# Patient Record
Sex: Male | Born: 1945 | Race: White | Hispanic: No | Smoking: Never smoker
Health system: Southern US, Community
[De-identification: ages and names within clinical notes are randomized; demographics above are authoritative.]

## PROBLEM LIST (undated history)

## (undated) DIAGNOSIS — M7741 Metatarsalgia, right foot: Secondary | ICD-10-CM

## (undated) DIAGNOSIS — I2699 Other pulmonary embolism without acute cor pulmonale: Secondary | ICD-10-CM

## (undated) DIAGNOSIS — I10 Essential (primary) hypertension: Secondary | ICD-10-CM

## (undated) DIAGNOSIS — K869 Disease of pancreas, unspecified: Secondary | ICD-10-CM

## (undated) DIAGNOSIS — I2510 Atherosclerotic heart disease of native coronary artery without angina pectoris: Secondary | ICD-10-CM

## (undated) DIAGNOSIS — G459 Transient cerebral ischemic attack, unspecified: Secondary | ICD-10-CM

## (undated) DIAGNOSIS — M109 Gout, unspecified: Secondary | ICD-10-CM

## (undated) DIAGNOSIS — Z87442 Personal history of urinary calculi: Secondary | ICD-10-CM

## (undated) DIAGNOSIS — Z7901 Long term (current) use of anticoagulants: Secondary | ICD-10-CM

## (undated) DIAGNOSIS — F419 Anxiety disorder, unspecified: Secondary | ICD-10-CM

## (undated) DIAGNOSIS — Z86718 Personal history of other venous thrombosis and embolism: Secondary | ICD-10-CM

## (undated) DIAGNOSIS — G4730 Sleep apnea, unspecified: Secondary | ICD-10-CM

## (undated) DIAGNOSIS — K862 Cyst of pancreas: Secondary | ICD-10-CM

## (undated) DIAGNOSIS — E785 Hyperlipidemia, unspecified: Secondary | ICD-10-CM

## (undated) DIAGNOSIS — I714 Abdominal aortic aneurysm, without rupture: Secondary | ICD-10-CM

## (undated) DIAGNOSIS — I739 Peripheral vascular disease, unspecified: Secondary | ICD-10-CM

## (undated) HISTORY — DX: Essential (primary) hypertension: I10

## (undated) HISTORY — PX: AORTIC VALVE REPLACEMENT: SHX41

## (undated) HISTORY — DX: Peripheral vascular disease, unspecified: I73.9

## (undated) HISTORY — DX: Transient cerebral ischemic attack, unspecified: G45.9

## (undated) HISTORY — DX: Abdominal aortic aneurysm, without rupture: I71.4

## (undated) HISTORY — DX: Personal history of other venous thrombosis and embolism: Z86.718

## (undated) HISTORY — DX: Disease of pancreas, unspecified: K86.9

## (undated) HISTORY — DX: Hyperlipidemia, unspecified: E78.5

## (undated) HISTORY — DX: Metatarsalgia, right foot: M77.41

## (undated) HISTORY — DX: Atherosclerotic heart disease of native coronary artery without angina pectoris: I25.10

## (undated) HISTORY — DX: Anxiety disorder, unspecified: F41.9

## (undated) HISTORY — DX: Cyst of pancreas: K86.2

## (undated) HISTORY — DX: Other pulmonary embolism without acute cor pulmonale: I26.99

## (undated) HISTORY — PX: BACK SURGERY: SHX140

## (undated) HISTORY — DX: Gout, unspecified: M10.9

## (undated) HISTORY — DX: Long term (current) use of anticoagulants: Z79.01

---

## 2001-07-16 ENCOUNTER — Emergency Department (HOSPITAL_COMMUNITY): Admission: EM | Admit: 2001-07-16 | Discharge: 2001-07-16 | Payer: Self-pay | Admitting: Emergency Medicine

## 2003-08-07 ENCOUNTER — Encounter: Payer: Self-pay | Admitting: Unknown Physician Specialty

## 2003-08-07 ENCOUNTER — Ambulatory Visit (HOSPITAL_COMMUNITY): Admission: RE | Admit: 2003-08-07 | Discharge: 2003-08-07 | Payer: Self-pay | Admitting: Unknown Physician Specialty

## 2003-11-29 HISTORY — PX: CORONARY ARTERY BYPASS GRAFT: SHX141

## 2004-07-18 ENCOUNTER — Inpatient Hospital Stay (HOSPITAL_COMMUNITY): Admission: EM | Admit: 2004-07-18 | Discharge: 2004-08-05 | Payer: Self-pay | Admitting: Emergency Medicine

## 2004-07-19 ENCOUNTER — Encounter (INDEPENDENT_AMBULATORY_CARE_PROVIDER_SITE_OTHER): Payer: Self-pay | Admitting: Cardiovascular Disease

## 2004-07-29 ENCOUNTER — Encounter (INDEPENDENT_AMBULATORY_CARE_PROVIDER_SITE_OTHER): Payer: Self-pay | Admitting: *Deleted

## 2004-08-07 ENCOUNTER — Emergency Department (HOSPITAL_COMMUNITY): Admission: EM | Admit: 2004-08-07 | Discharge: 2004-08-07 | Payer: Self-pay | Admitting: Emergency Medicine

## 2004-08-16 ENCOUNTER — Encounter: Admission: RE | Admit: 2004-08-16 | Discharge: 2004-08-16 | Payer: Self-pay | Admitting: Surgery

## 2004-08-17 ENCOUNTER — Ambulatory Visit (HOSPITAL_COMMUNITY): Admission: RE | Admit: 2004-08-17 | Discharge: 2004-08-17 | Payer: Self-pay | Admitting: Cardiovascular Disease

## 2004-09-29 ENCOUNTER — Ambulatory Visit: Payer: Self-pay | Admitting: Family Medicine

## 2005-01-03 IMAGING — CR DG CHEST 2V
2 series · 2 of 2 positions shown · non-contrast
Comparison: none

CLINICAL DATA: Coronary artery disease.  Prior CABG ? follow/up. 
 CHEST X-RAY:
 Two views of the chest are compared to a portable chest x-ray from [REDACTED] dated 08/07/04.  Opacity at the left lung base persists consistent with atelectasis and small left effusion.  The right lung is clear.  No pneumothorax is seen.  Heart size is stable.

[view not recorded (1 of 2)]
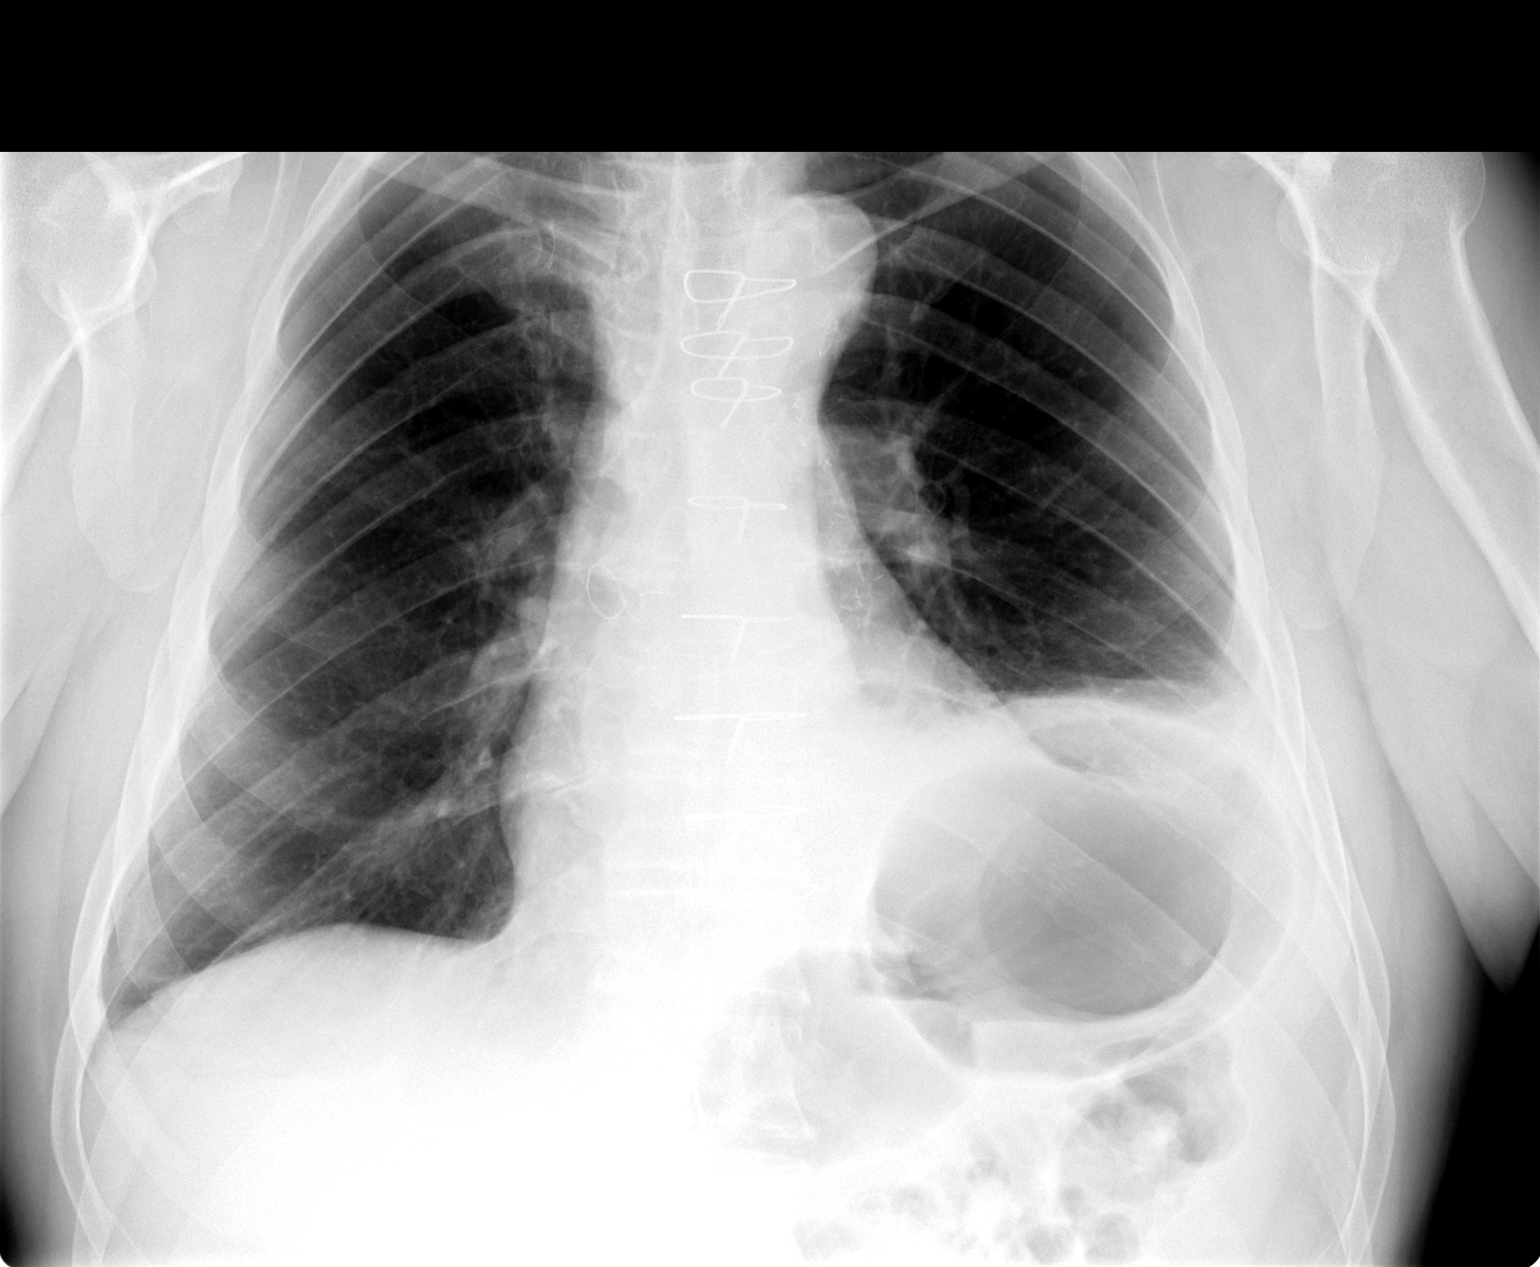

[view not recorded (2 of 2)]
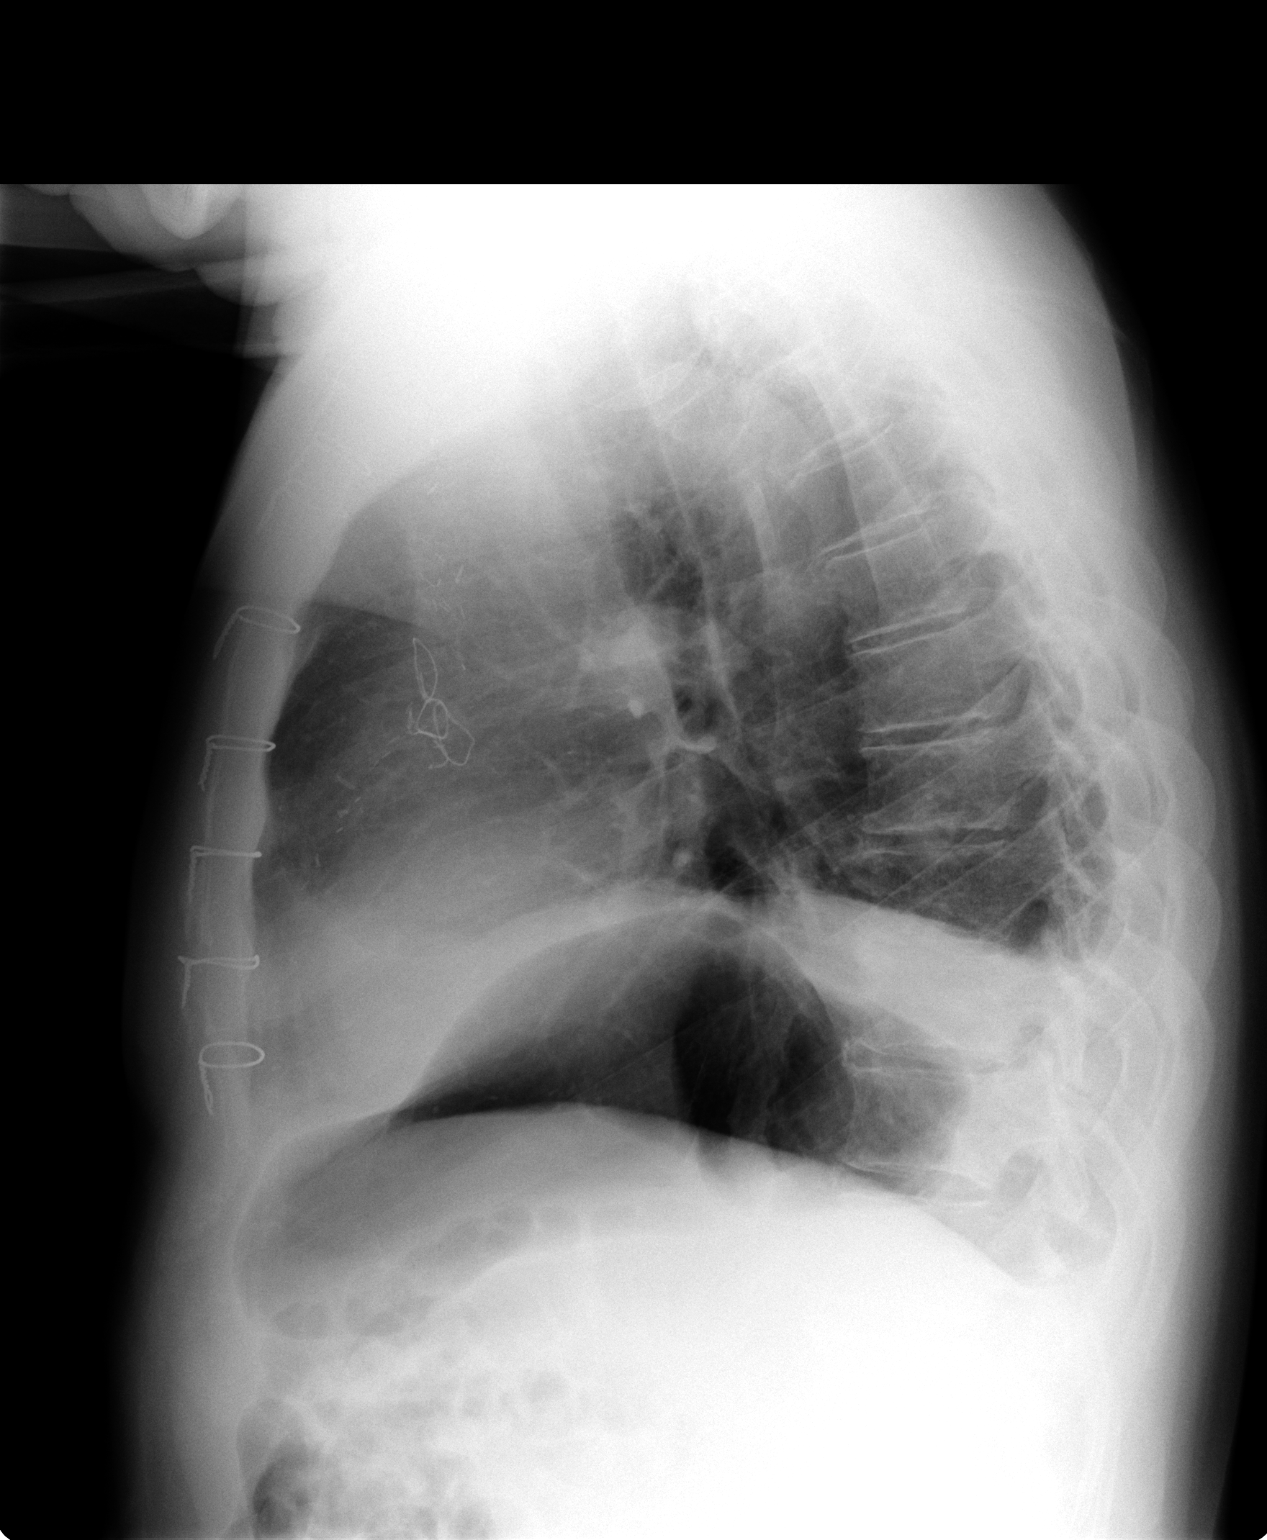

[2 of 2 positions shown; findings below may reference images not displayed]

IMPRESSION: Persistent opacity at the left lung base consistent with left effusion and left basilar atelectasis.

## 2005-01-04 IMAGING — CT CT HEAD W/O CM
1 series · 16 of 28 positions shown, 20 images · non-contrast
Comparison: 07/19/2004

CLINICAL DATA: Recent bypass. Now with blurred vision in left eye and dizziness.

Head CT without contrast.

[Series 2: brain · axial · 0.47mm/px · z∈[+120,+250]mm · 16 of 28 slices shown, 20 images]
[im 2/28  brain]
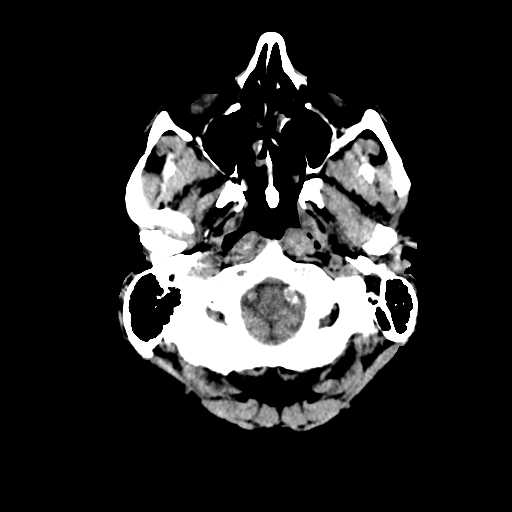
[im 2/28  bone]
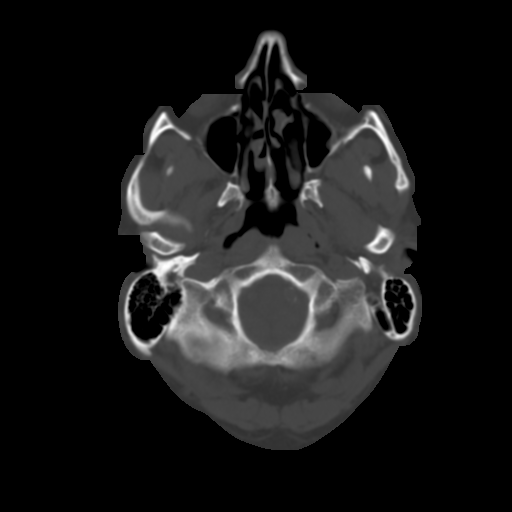
[im 4/28  brain]
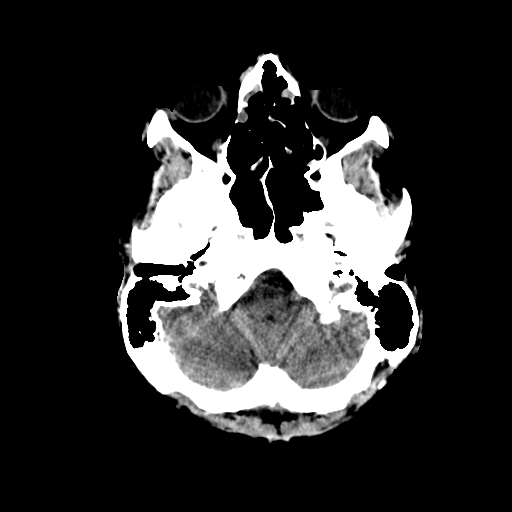
[im 6/28  brain]
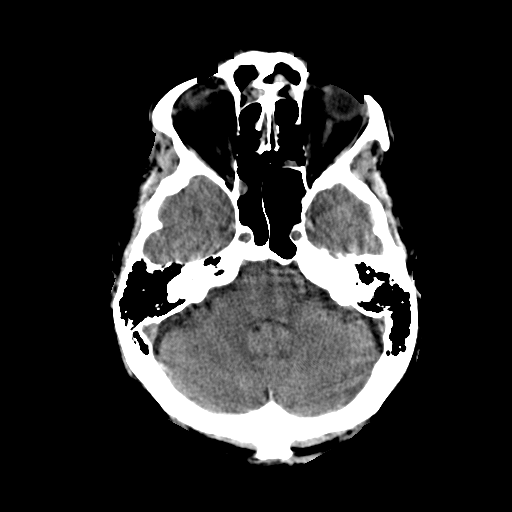
[im 7/28  brain]
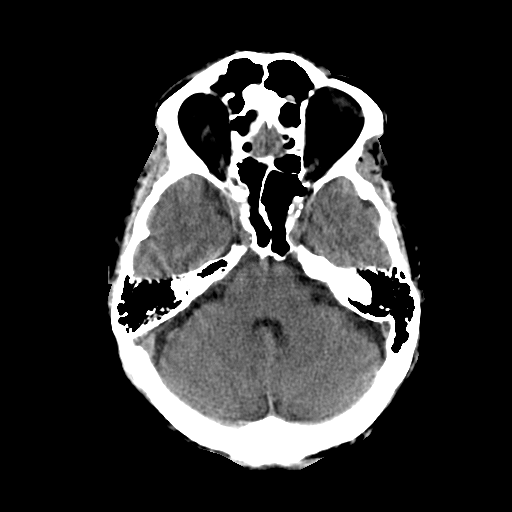
[im 9/28  brain]
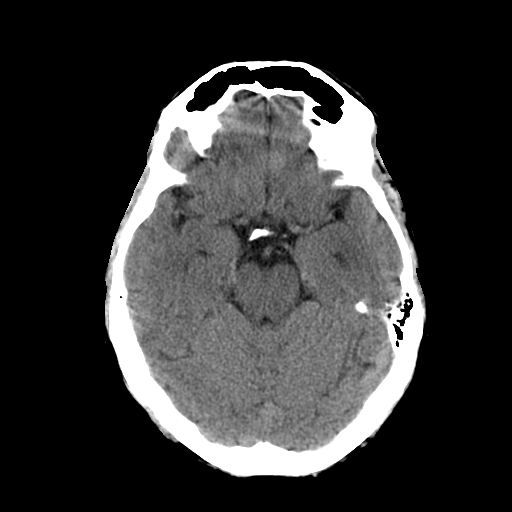
[im 9/28  bone]
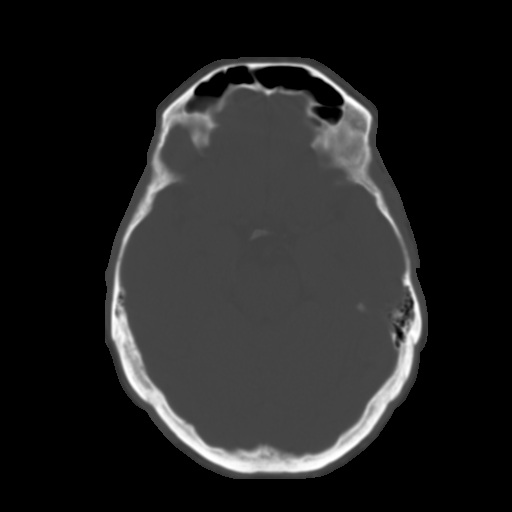
[im 10/28  brain]
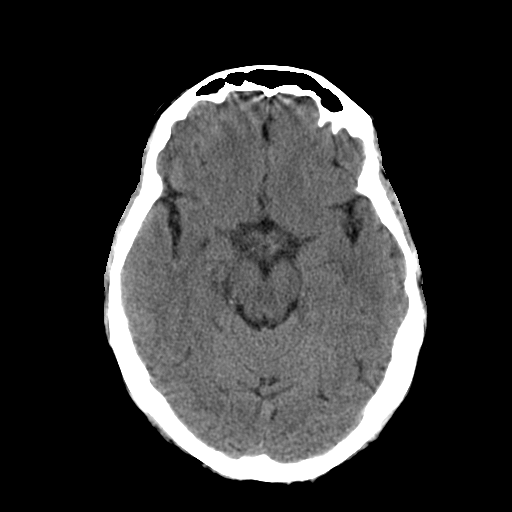
[im 12/28  brain]
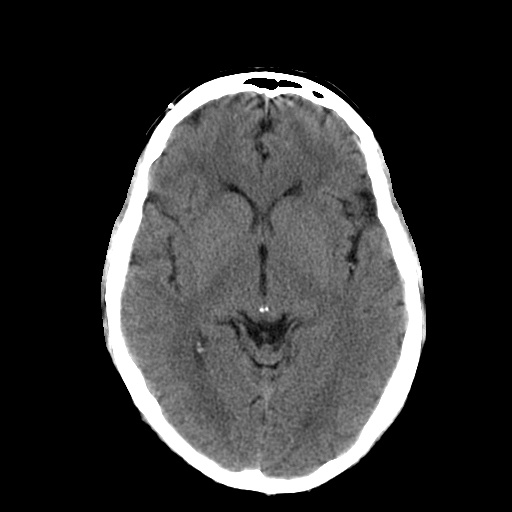
[im 14/28  brain]
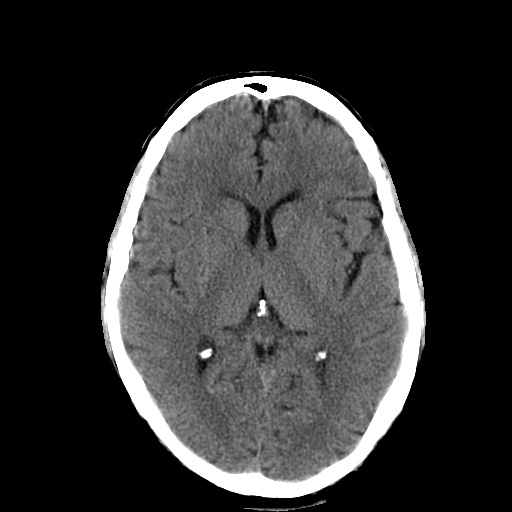
[im 15/28  brain]
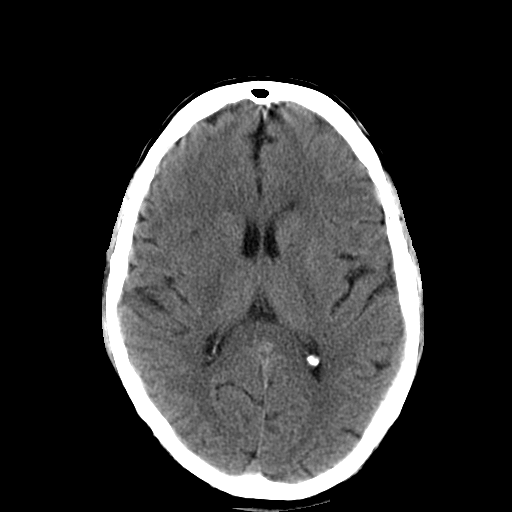
[im 15/28  bone]
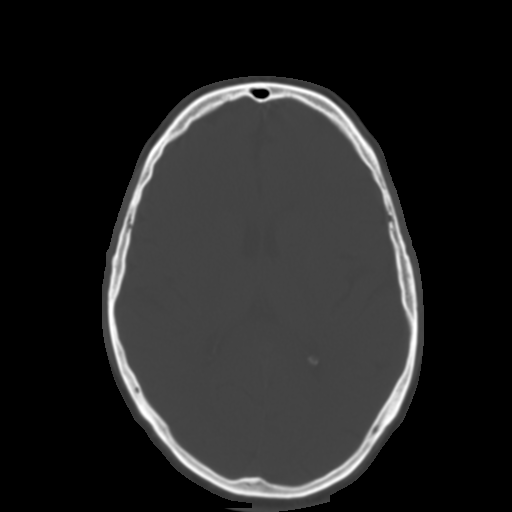
[im 17/28  brain]
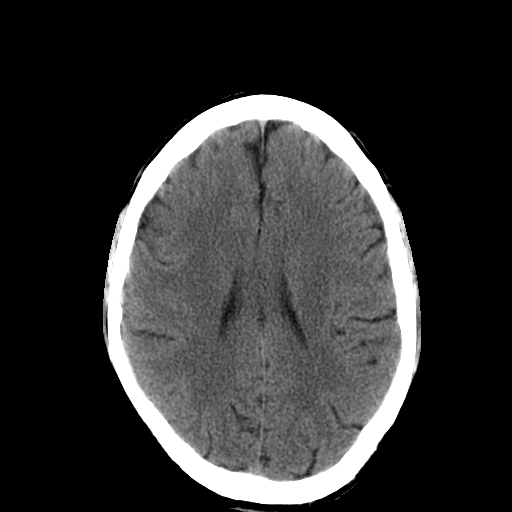
[im 19/28  brain]
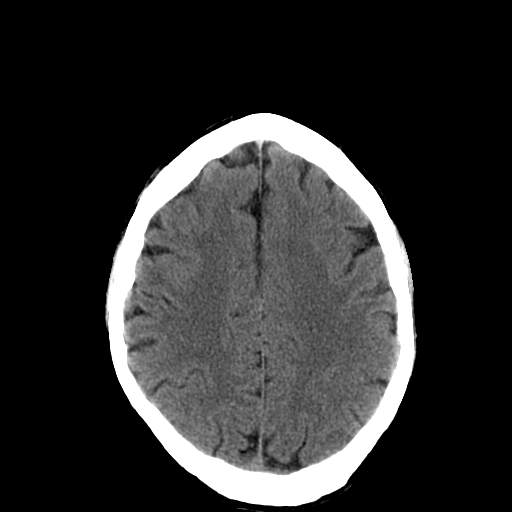
[im 20/28  brain]
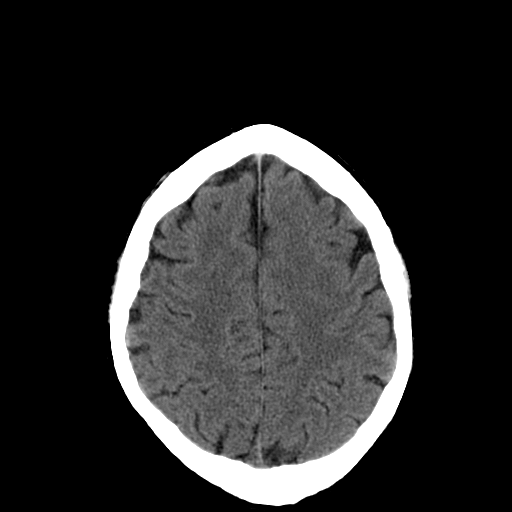
[im 22/28  brain]
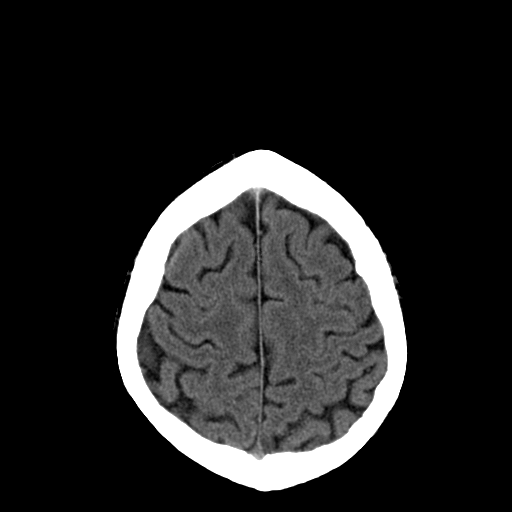
[im 22/28  bone]
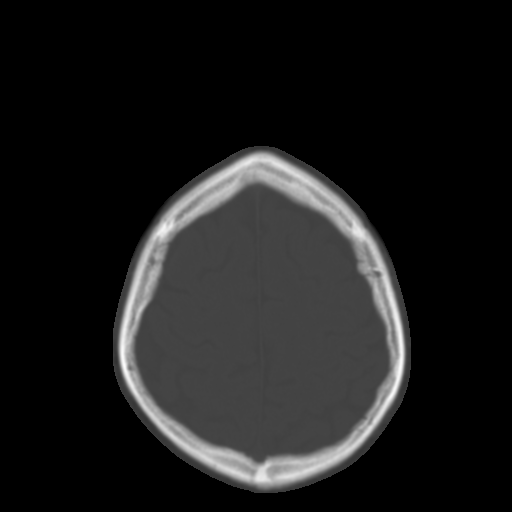
[im 23/28  brain]
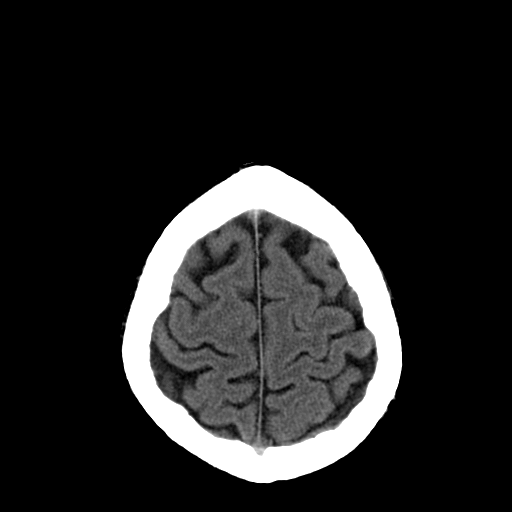
[im 25/28  brain]
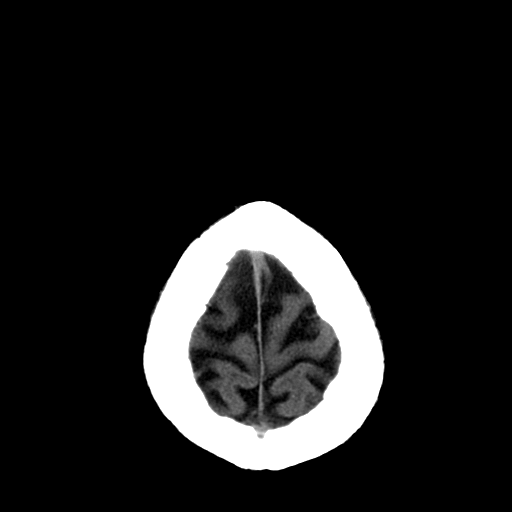
[im 27/28  brain]
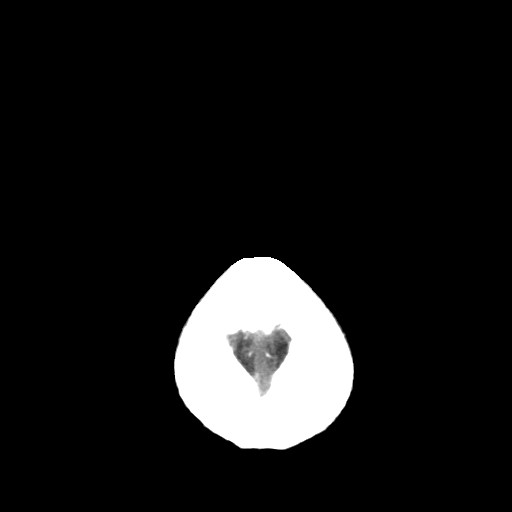

[16 of 28 positions shown; findings below may reference images not displayed]

FINDINGS: There is mild cerebral atrophy. No acute intracranial findings. Specifically no evidence
of hemorrhage, hydrocephalus, tumor, vascular lesion, or acute infarction.

There are mild changes of  chronic sinusitis noted. Visualize calvarium unremarkable.

IMPRESSION

No acute intracranial abnormality.

## 2005-03-22 ENCOUNTER — Ambulatory Visit: Payer: Self-pay | Admitting: Family Medicine

## 2005-10-05 ENCOUNTER — Ambulatory Visit: Payer: Self-pay | Admitting: Family Medicine

## 2006-06-13 ENCOUNTER — Ambulatory Visit: Payer: Self-pay | Admitting: Family Medicine

## 2006-11-03 ENCOUNTER — Ambulatory Visit: Payer: Self-pay | Admitting: Family Medicine

## 2007-05-07 ENCOUNTER — Ambulatory Visit: Payer: Self-pay | Admitting: Family Medicine

## 2009-03-22 ENCOUNTER — Emergency Department (HOSPITAL_COMMUNITY): Admission: EM | Admit: 2009-03-22 | Discharge: 2009-03-22 | Payer: Self-pay | Admitting: Emergency Medicine

## 2009-03-23 ENCOUNTER — Inpatient Hospital Stay (HOSPITAL_COMMUNITY): Admission: EM | Admit: 2009-03-23 | Discharge: 2009-03-29 | Payer: Self-pay | Admitting: Emergency Medicine

## 2009-03-27 ENCOUNTER — Encounter (INDEPENDENT_AMBULATORY_CARE_PROVIDER_SITE_OTHER): Payer: Self-pay | Admitting: Orthopedic Surgery

## 2009-03-27 ENCOUNTER — Ambulatory Visit: Payer: Self-pay | Admitting: Vascular Surgery

## 2009-04-06 ENCOUNTER — Inpatient Hospital Stay (HOSPITAL_COMMUNITY): Admission: EM | Admit: 2009-04-06 | Discharge: 2009-04-14 | Payer: Self-pay | Admitting: Emergency Medicine

## 2009-04-06 ENCOUNTER — Encounter (INDEPENDENT_AMBULATORY_CARE_PROVIDER_SITE_OTHER): Payer: Self-pay | Admitting: Internal Medicine

## 2009-04-21 ENCOUNTER — Emergency Department (HOSPITAL_COMMUNITY): Admission: EM | Admit: 2009-04-21 | Discharge: 2009-04-21 | Payer: Self-pay | Admitting: Emergency Medicine

## 2011-03-08 LAB — COMPREHENSIVE METABOLIC PANEL
ALT: 130 U/L — ABNORMAL HIGH (ref 0–53)
ALT: 151 U/L — ABNORMAL HIGH (ref 0–53)
ALT: 150 U/L — ABNORMAL HIGH (ref 0–53)
ALT: 58 U/L — ABNORMAL HIGH (ref 0–53)
AST: 130 U/L — ABNORMAL HIGH (ref 0–37)
Albumin: 2.3 g/dL — ABNORMAL LOW (ref 3.5–5.2)
Albumin: 2.5 g/dL — ABNORMAL LOW (ref 3.5–5.2)
Alkaline Phosphatase: 193 U/L — ABNORMAL HIGH (ref 39–117)
Alkaline Phosphatase: 75 U/L (ref 39–117)
Alkaline Phosphatase: 97 U/L (ref 39–117)
BUN: 15 mg/dL (ref 6–23)
BUN: 18 mg/dL (ref 6–23)
BUN: 25 mg/dL — ABNORMAL HIGH (ref 6–23)
CO2: 32 mEq/L (ref 19–32)
CO2: 27 mEq/L (ref 19–32)
CO2: 33 mEq/L — ABNORMAL HIGH (ref 19–32)
Calcium: 9.2 mg/dL (ref 8.4–10.5)
Calcium: 9.3 mg/dL (ref 8.4–10.5)
Calcium: 8.7 mg/dL (ref 8.4–10.5)
Chloride: 108 mEq/L (ref 96–112)
Chloride: 100 mEq/L (ref 96–112)
Chloride: 101 mEq/L (ref 96–112)
Chloride: 103 mEq/L (ref 96–112)
Creatinine, Ser: 1.16 mg/dL (ref 0.4–1.5)
Creatinine, Ser: 1.19 mg/dL (ref 0.4–1.5)
Creatinine, Ser: 1.13 mg/dL (ref 0.4–1.5)
Creatinine, Ser: 1.23 mg/dL (ref 0.4–1.5)
GFR calc non Af Amer: >60 mL/min (ref >60)
GFR calc non Af Amer: 59 mL/min — ABNORMAL LOW (ref >60)
GFR calc non Af Amer: >60 mL/min (ref >60)
Glucose, Bld: 112 mg/dL — ABNORMAL HIGH (ref 70–99)
Glucose, Bld: 122 mg/dL — ABNORMAL HIGH (ref 70–99)
Glucose, Bld: 127 mg/dL — ABNORMAL HIGH (ref 70–99)
Glucose, Bld: 99 mg/dL (ref 70–99)
Potassium: 4.9 mEq/L (ref 3.5–5.1)
Potassium: 3.5 mEq/L (ref 3.5–5.1)
Potassium: 3.8 mEq/L (ref 3.5–5.1)
Sodium: 143 mEq/L (ref 135–145)
Sodium: 144 mEq/L (ref 135–145)
Sodium: 137 mEq/L (ref 135–145)
Total Bilirubin: 0.8 mg/dL (ref 0.3–1.2)
Total Bilirubin: 0.8 mg/dL (ref 0.3–1.2)
Total Bilirubin: 0.8 mg/dL (ref 0.3–1.2)
Total Bilirubin: 1.3 mg/dL — ABNORMAL HIGH (ref 0.3–1.2)
Total Protein: 6.2 g/dL (ref 6.0–8.3)
Total Protein: 6.2 g/dL (ref 6.0–8.3)
Total Protein: 5.9 g/dL — ABNORMAL LOW (ref 6.0–8.3)

## 2011-03-08 LAB — BASIC METABOLIC PANEL
BUN: 22 mg/dL (ref 6–23)
BUN: 19 mg/dL (ref 6–23)
BUN: 23 mg/dL (ref 6–23)
CO2: 28 mEq/L (ref 19–32)
CO2: 29 mEq/L (ref 19–32)
CO2: 30 mEq/L (ref 19–32)
Calcium: 8.5 mg/dL (ref 8.4–10.5)
Chloride: 102 mEq/L (ref 96–112)
Chloride: 102 mEq/L (ref 96–112)
Chloride: 105 mEq/L (ref 96–112)
Creatinine, Ser: 1.15 mg/dL (ref 0.4–1.5)
Creatinine, Ser: 1.13 mg/dL (ref 0.4–1.5)
GFR calc Af Amer: >60 mL/min (ref >60)
GFR calc Af Amer: >60 mL/min (ref >60)
GFR calc non Af Amer: >60 mL/min (ref >60)
Glucose, Bld: 101 mg/dL — ABNORMAL HIGH (ref 70–99)
Glucose, Bld: 103 mg/dL — ABNORMAL HIGH (ref 70–99)
Glucose, Bld: 106 mg/dL — ABNORMAL HIGH (ref 70–99)
Glucose, Bld: 123 mg/dL — ABNORMAL HIGH (ref 70–99)
Potassium: 3.2 mEq/L — ABNORMAL LOW (ref 3.5–5.1)
Potassium: 4.4 mEq/L (ref 3.5–5.1)
Sodium: 138 mEq/L (ref 135–145)
Sodium: 140 mEq/L (ref 135–145)

## 2011-03-08 LAB — CBC
HCT: 33.9 % — ABNORMAL LOW (ref 39.0–52.0)
HCT: 34.3 % — ABNORMAL LOW (ref 39.0–52.0)
HCT: 31.4 % — ABNORMAL LOW (ref 39.0–52.0)
HCT: 32.2 % — ABNORMAL LOW (ref 39.0–52.0)
HCT: 33.6 % — ABNORMAL LOW (ref 39.0–52.0)
HCT: 35.3 % — ABNORMAL LOW (ref 39.0–52.0)
Hemoglobin: 11.6 g/dL — ABNORMAL LOW (ref 13.0–17.0)
Hemoglobin: 10.9 g/dL — ABNORMAL LOW (ref 13.0–17.0)
Hemoglobin: 11.2 g/dL — ABNORMAL LOW (ref 13.0–17.0)
Hemoglobin: 11.6 g/dL — ABNORMAL LOW (ref 13.0–17.0)
Hemoglobin: 11.7 g/dL — ABNORMAL LOW (ref 13.0–17.0)
MCHC: 33.8 g/dL (ref 30.0–36.0)
MCHC: 34 g/dL (ref 30.0–36.0)
MCHC: 33.7 g/dL (ref 30.0–36.0)
MCHC: 33.8 g/dL (ref 30.0–36.0)
MCHC: 34.6 g/dL (ref 30.0–36.0)
MCHC: 34.7 g/dL (ref 30.0–36.0)
MCHC: 34.8 g/dL (ref 30.0–36.0)
MCHC: 35.2 g/dL (ref 30.0–36.0)
MCV: 97 fL (ref 78.0–100.0)
MCV: 98.3 fL (ref 78.0–100.0)
MCV: 98.6 fL (ref 78.0–100.0)
MCV: 98.6 fL (ref 78.0–100.0)
MCV: 98.7 fL (ref 78.0–100.0)
MCV: 98.8 fL (ref 78.0–100.0)
MCV: 99.4 fL (ref 78.0–100.0)
Platelets: 372 10*3/uL (ref 150–400)
Platelets: 125 10*3/uL — ABNORMAL LOW (ref 150–400)
Platelets: 243 10*3/uL (ref 150–400)
Platelets: 305 10*3/uL (ref 150–400)
RBC: 3.45 MIL/uL — ABNORMAL LOW (ref 4.22–5.81)
RBC: 3.34 MIL/uL — ABNORMAL LOW (ref 4.22–5.81)
RBC: 3.38 MIL/uL — ABNORMAL LOW (ref 4.22–5.81)
RBC: 3.77 MIL/uL — ABNORMAL LOW (ref 4.22–5.81)
RDW: 14.1 % (ref 11.5–15.5)
RDW: 14.1 % (ref 11.5–15.5)
RDW: 13.3 % (ref 11.5–15.5)
RDW: 13.4 % (ref 11.5–15.5)
RDW: 13.6 % (ref 11.5–15.5)
RDW: 13.9 % (ref 11.5–15.5)
RDW: 14.1 % (ref 11.5–15.5)
WBC: 12.2 10*3/uL — ABNORMAL HIGH (ref 4.0–10.5)
WBC: 12.6 10*3/uL — ABNORMAL HIGH (ref 4.0–10.5)
WBC: 17.5 10*3/uL — ABNORMAL HIGH (ref 4.0–10.5)
WBC: 8.9 10*3/uL (ref 4.0–10.5)

## 2011-03-08 LAB — HEMOGLOBIN A1C: Hgb A1c MFr Bld: 5.4 % (ref 4.6–6.1)

## 2011-03-08 LAB — PROTIME-INR
INR: 2.3 — ABNORMAL HIGH (ref 0.00–1.49)
INR: 2.3 — ABNORMAL HIGH (ref 0.00–1.49)
Prothrombin Time: 15.5 seconds — ABNORMAL HIGH (ref 11.6–15.2)
Prothrombin Time: 26.6 seconds — ABNORMAL HIGH (ref 11.6–15.2)
Prothrombin Time: 32.2 seconds — ABNORMAL HIGH (ref 11.6–15.2)
Prothrombin Time: 32.6 seconds — ABNORMAL HIGH (ref 11.6–15.2)

## 2011-03-08 LAB — URINALYSIS, ROUTINE W REFLEX MICROSCOPIC
Ketones, ur: NEGATIVE mg/dL
Nitrite: NEGATIVE
pH: 5 (ref 5.0–8.0)

## 2011-03-08 LAB — DIFFERENTIAL
Basophils Absolute: 0 10*3/uL (ref 0.0–0.1)
Basophils Absolute: 0.1 10*3/uL (ref 0.0–0.1)
Basophils Relative: 0 % (ref 0–1)
Basophils Relative: 0 % (ref 0–1)
Eosinophils Absolute: 0.2 10*3/uL (ref 0.0–0.7)
Eosinophils Relative: 1 % (ref 0–5)
Lymphocytes Relative: 6 % — ABNORMAL LOW (ref 12–46)
Lymphs Abs: 1.4 10*3/uL (ref 0.7–4.0)
Monocytes Absolute: 1.2 10*3/uL — ABNORMAL HIGH (ref 0.1–1.0)
Neutro Abs: 14.9 10*3/uL — ABNORMAL HIGH (ref 1.7–7.7)
Neutrophils Relative %: 86 % — ABNORMAL HIGH (ref 43–77)

## 2011-03-08 LAB — URINE CULTURE: Colony Count: 4000

## 2011-03-08 LAB — URINE MICROSCOPIC-ADD ON

## 2011-03-09 LAB — COMPREHENSIVE METABOLIC PANEL
ALT: 26 U/L (ref 0–53)
AST: 35 U/L (ref 0–37)
Alkaline Phosphatase: 74 U/L (ref 39–117)
CO2: 29 mEq/L (ref 19–32)
Calcium: 9.1 mg/dL (ref 8.4–10.5)
GFR calc Af Amer: >60 mL/min (ref >60)
GFR calc non Af Amer: >60 mL/min (ref >60)
Glucose, Bld: 115 mg/dL — ABNORMAL HIGH (ref 70–99)
Potassium: 3.6 mEq/L (ref 3.5–5.1)
Sodium: 143 mEq/L (ref 135–145)

## 2011-03-09 LAB — BASIC METABOLIC PANEL
BUN: 16 mg/dL (ref 6–23)
BUN: 19 mg/dL (ref 6–23)
BUN: 25 mg/dL — ABNORMAL HIGH (ref 6–23)
BUN: 26 mg/dL — ABNORMAL HIGH (ref 6–23)
CO2: 24 mEq/L (ref 19–32)
CO2: 29 mEq/L (ref 19–32)
CO2: 30 mEq/L (ref 19–32)
Calcium: 9.4 mg/dL (ref 8.4–10.5)
Chloride: 102 mEq/L (ref 96–112)
Chloride: 103 mEq/L (ref 96–112)
Chloride: 108 mEq/L (ref 96–112)
Creatinine, Ser: 1.14 mg/dL (ref 0.4–1.5)
Creatinine, Ser: 1.16 mg/dL (ref 0.4–1.5)
Creatinine, Ser: 1.24 mg/dL (ref 0.4–1.5)
Creatinine, Ser: 1.3 mg/dL (ref 0.4–1.5)
GFR calc Af Amer: >60 mL/min (ref >60)
GFR calc Af Amer: >60 mL/min (ref >60)
GFR calc non Af Amer: >60 mL/min (ref >60)
GFR calc non Af Amer: >60 mL/min (ref >60)
Glucose, Bld: 100 mg/dL — ABNORMAL HIGH (ref 70–99)
Glucose, Bld: 122 mg/dL — ABNORMAL HIGH (ref 70–99)
Glucose, Bld: 99 mg/dL (ref 70–99)
Potassium: 3.6 mEq/L (ref 3.5–5.1)
Potassium: 4.3 mEq/L (ref 3.5–5.1)
Potassium: 4.3 mEq/L (ref 3.5–5.1)

## 2011-03-09 LAB — DIFFERENTIAL
Basophils Absolute: 0.1 10*3/uL (ref 0.0–0.1)
Basophils Relative: 1 % (ref 0–1)
Eosinophils Absolute: 0.2 10*3/uL (ref 0.0–0.7)
Neutro Abs: 5.5 10*3/uL (ref 1.7–7.7)
Neutrophils Relative %: 64 % (ref 43–77)

## 2011-03-09 LAB — CBC
HCT: 37.5 % — ABNORMAL LOW (ref 39.0–52.0)
HCT: 39.2 % (ref 39.0–52.0)
HCT: 41.4 % (ref 39.0–52.0)
Hemoglobin: 14.2 g/dL (ref 13.0–17.0)
MCHC: 34.2 g/dL (ref 30.0–36.0)
MCHC: 34.4 g/dL (ref 30.0–36.0)
MCHC: 34.5 g/dL (ref 30.0–36.0)
MCHC: 34.8 g/dL (ref 30.0–36.0)
MCV: 100.1 fL — ABNORMAL HIGH (ref 78.0–100.0)
MCV: 100.3 fL — ABNORMAL HIGH (ref 78.0–100.0)
MCV: 99.9 fL (ref 78.0–100.0)
Platelets: 135 10*3/uL — ABNORMAL LOW (ref 150–400)
Platelets: 141 10*3/uL — ABNORMAL LOW (ref 150–400)
Platelets: 142 10*3/uL — ABNORMAL LOW (ref 150–400)
Platelets: 142 10*3/uL — ABNORMAL LOW (ref 150–400)
RBC: 4.05 MIL/uL — ABNORMAL LOW (ref 4.22–5.81)
RBC: 4.1 MIL/uL — ABNORMAL LOW (ref 4.22–5.81)
RBC: 4.1 MIL/uL — ABNORMAL LOW (ref 4.22–5.81)
RBC: 4.13 MIL/uL — ABNORMAL LOW (ref 4.22–5.81)
RDW: 12.2 % (ref 11.5–15.5)
RDW: 12.9 % (ref 11.5–15.5)
RDW: 12.9 % (ref 11.5–15.5)
WBC: 11.5 10*3/uL — ABNORMAL HIGH (ref 4.0–10.5)
WBC: 6 10*3/uL (ref 4.0–10.5)
WBC: 8.4 10*3/uL (ref 4.0–10.5)

## 2011-03-09 LAB — URINALYSIS, ROUTINE W REFLEX MICROSCOPIC
Glucose, UA: NEGATIVE mg/dL
Hgb urine dipstick: NEGATIVE
Hgb urine dipstick: NEGATIVE
Ketones, ur: NEGATIVE mg/dL
Ketones, ur: NEGATIVE mg/dL
Protein, ur: NEGATIVE mg/dL
Urobilinogen, UA: 0.2 mg/dL (ref 0.0–1.0)
pH: 5.5 (ref 5.0–8.0)

## 2011-03-09 LAB — PROTIME-INR: Prothrombin Time: 14.3 seconds (ref 11.6–15.2)

## 2011-03-09 LAB — HEMOGLOBIN A1C: Hgb A1c MFr Bld: 5.5 % (ref 4.6–6.1)

## 2011-03-21 ENCOUNTER — Ambulatory Visit (HOSPITAL_COMMUNITY)
Admission: RE | Admit: 2011-03-21 | Discharge: 2011-03-21 | Disposition: A | Discharge status: Home / Self Care | Payer: Medicare Other | Source: Ambulatory Visit | Attending: Family Medicine | Admitting: Family Medicine

## 2011-03-21 ENCOUNTER — Other Ambulatory Visit (HOSPITAL_COMMUNITY): Payer: Self-pay | Admitting: Family Medicine

## 2011-03-21 DIAGNOSIS — I82409 Acute embolism and thrombosis of unspecified deep veins of unspecified lower extremity: Secondary | ICD-10-CM

## 2011-03-21 DIAGNOSIS — M7989 Other specified soft tissue disorders: Secondary | ICD-10-CM | POA: Insufficient documentation

## 2011-04-12 NOTE — Discharge Summary (Signed)
Kenneth Scott, PLACK NO.:  [id number]   MEDICAL RECORD NO.:  [id number]          PATIENT TYPE:  INP   LOCATION:  1507                         FACILITY:  [facility]   PHYSICIAN:  Hillery Aldo, M.D.   DATE OF BIRTH:  1945-12-05   DATE OF ADMISSION:  04/05/2009  DATE OF DISCHARGE:  04/14/2009                               DISCHARGE SUMMARY   PRIMARY CARE PHYSICIAN:  Dr. Lysbeth Galas.   CARDIOLOGIST:  Dr. Tresa Endo.   DISCHARGE DIAGNOSES:  1. Right lower extremity deep venous thrombosis.  2. Large right lower lobe pulmonary embolism.  3. Right lower lobe pneumonia.  4. Right lower lobe pulmonary infarction.  5. Right lower lobe pulmonary hemorrhage.  6. A 3.4-cm infrarenal abdominal aortic aneurysm.  7. Bilateral iliac artery aneurysms measuring 2.2 cm in the left and      distal right common iliac arteries.  8. History of coronary artery disease.  9. Hypertension.  10.Acute renal failure, resolved.  11.Gout.  12.Depression.  13.Dyslipidemia.  14.Constipation.  15.Hypokalemia.  16.Spinal stenosis, status post recent spinal surgery with laminectomy      and central decompression.  17.Prostatomegaly.   DISCHARGE MEDICATIONS:  1. Avalide 150/12.5 mg p.o. daily.  2. Lexapro 10 mg p.o. daily.  3. Metoprolol 25 mg p.o. b.i.d.  4. Vicodin 10/325 one tablet every 6 hours p.r.n. pain.  5. Robaxin 500 mg p.o. every 6 hours p.r.n. muscle spasm.  6. Coumadin 2.5 mg p.o. daily or as directed by Dr. Lysbeth Galas.  7. Aspirin 81 mg p.o. daily.   Note:  Crestor was discontinued due to elevated LFTs, colchicine was  discontinued secondary to acute renal failure.  The patient was  instructed not to restart Plavix which had been discontinued prior to  his surgery at the direction of Dr. Tresa Endo who saw the patient in  consultation while in the hospital to determine which antiplatelet  medications should be given since the patient would be on therapeutic  anticoagulation.   CONSULTATIONS:  Dr. Tresa Endo of Cardiology.   BRIEF ADMISSION HPI:  Patient is a 65 year old male with past medical  history of coronary artery disease who underwent a central  decompression, laminectomy, foraminotomy, microdiskectomy, and other  procedures for spinal stenosis by Dr. Jene Every.  He subsequently  was discharged and re-presented to the hospital on the date of admission  with a chief complaint of right-sided flank pain and some dyspnea.  Upon  initial evaluation in the emergency department, the patient was noted to  have an abnormal chest x-ray and subsequently underwent venous Doppler  studies which did show evidence of a right deep venous thrombosis and  therefore the patient was admitted for further evaluation and workup.  For the full details, please see the dictated report done by Dr. Center Callas.   PROCEDURES AND DIAGNOSTIC STUDIES.:  1. Chest x-ray on Apr 05, 2009, showed low-volume chest with bibasilar      atelectasis, postoperative changes of CABG, torturous thoracic      aorta accentuated by low lung volumes.  2. Venous Doppler studies of the lower extremities on Apr 06, 2009,  showed a deep venous thrombosis involving the right lower extremity      with no evidence of deep vein thrombosis involving the left lower      extremity.  No Baker cysts.  The right posterior tibial and right      peroneal veins were partially thrombosed.  3. CT scan of the chest, abdomen, and pelvis on Apr 06, 2009.  Chest:      Pulmonary embolus, especially in the right lower lobe pulmonary      artery in its branches with some extension into the right middle      lobe.  Clot burden is moderate and there is extensive air space      opacity in the right lower lobe which may be due to pulmonary      hemorrhage or infection.  Small right pleural effusion.      Atelectasis and volume loss in portions of the right upper lobe and      left lower lobe.  Abdomen:  Marked dependent atelectasis  in the      lungs.  Diffuse air space disease in the posterior basal segment of      the right lower lobe.  This probably represents infection or      aspiration.  Mass in the right lower lobe bronchovascular bundle.      Hepatic and renal cyst.  Small hiatal hernia.  A 3.4-cm infrarenal      abdominal aortic aneurysm.  Pelvis:  Bilateral iliac artery      aneurysms measuring 2.2 cm in the proximal left and distal right      common iliac arteries.  Postoperative changes of L4-L5.  Presumed      iatrogenic air in the urinary bladder.  4. Abdominal ultrasound on Apr 11, 2009, showed cholelithiasis and      sludge within the gallbladder without evidence of acute      cholecystitis or biliary dilatation.  Bilateral renal cysts and      left kidney is mildly complicated by diffuse low level internal      echoes.  This lesion showed no enhancement on preceding CT of Apr 06, 2009.  Small distal abdominal aortic aneurysm 3.3 cm greatest      in size.  Small right pleural effusion.   DISCHARGE LABORATORY VALUES:  PT/INR was 26.6, 2.3.  Sodium was 144,  potassium 4.9, chloride 108, bicarb 32, BUN 18, creatinine 1.19, glucose  112, total bilirubin 0.7, alkaline phosphatase 193, AST 88, ALT 130,  total protein 6.2, albumin 2.3, calcium 9.3.  White blood cell count was  12.3, hemoglobin 11.5, hematocrit 33.9, platelets 510.   HOSPITAL COURSE BY PROBLEM:  1. Acute right lower extremity DVT/right lower lobe pulmonary      embolism:  Patient was admitted and a diagnostic evaluation did      reveal postoperative DVT and embolization to the right lower lobe      of the lung.  Upon confirmation of this diagnosis, the patient was      initiated on therapeutic dose Lovenox and Coumadin.  Because of      concurrent antibiotic therapy, there was some difficulty with      supratherapeutic INRs.  Patient has now completed his course of      antibiotic therapy and his INR has come down.  His Coumadin was       held for several days prior to discharge and is being resumed today  at 2.5 mg.  It is likely that he will need a higher dose of      Coumadin in the outpatient setting and therefore we have arranged      close follow up with Dr. Lysbeth Galas and a repeat of his PT/INR on Apr 15, 2009, at Dr. Joyce Copa office.  His INR should be measured daily      with adjustment of his Coumadin dose as needed until it stabilizes.  2. Left lower lobe pneumonia with pulmonary infarction and hemorrhage:      Patient initially presented with signs of infection.  He had a      leukocytosis of 17.5 and given that he appeared septic with a      fever, he was treated for hospital-acquired pathogens.  He was      empirically put on vancomycin and Zosyn and completed a full 7-day      course of therapy with antibiotics which was subsequently      transitioned over to Levaquin on Apr 10, 2009.  Patient's white      blood cell count has stabilized and he has been afebrile and feels      well.  No further antibiotics are indicated at this time.  3. Peripheral vascular disease/peripheral arterial disease/abdominal      aortic aneurysm:  Patient should be monitored in the outpatient      environment for progression of disease.  His blood pressure should      remain well controlled.  His blood pressure has remained fairly      well controlled throughout his hospital stay with no complications.  4. Hypertension:  Patient's blood pressure was well controlled as      noted above.  5. Acute renal failure:  Patient did develop a bump in his creatinine      which may have been contrast induced.  His creatinine reached a      high of 1.30.  The patient's Avalide was held and his Coumadin      trended back down to baseline.  He can safely resume Avalide at      discharge.  6. Gout:  Patient indicates that his gout symptoms did not start until      he was put on niacin for treatment of his dyslipidemia.  Niacin      does  cause hyperuricemia and is not recommended in patients with      gout.  Patient was encouraged to discontinue niacin and speak with      his cardiologist regarding alternatives to therapy.  7. Constipation:  Patient was put on a bowel regimen and he was able      to move his bowels without difficulty.  8. Hypokalemia:  Patient was fully repleted and put on routine      supplements.  Supplements have been discontinued and he should have      a regular check of his electrolytes through his primary care      physician.  9. Dyslipidemia:  Patient was initially maintained on his usual dose      of statin therapy.  He did develop elevated liver function studies      that were new from admission when his LFTs were relatively normal.      Because of this, a right upper quadrant ultrasound was obtained to      rule out cholecystitis.  Patient had no symptoms of abdominal  discomfort, nausea, or vomiting.  His statin was held and has not      been resumed due to the elevation of his liver function studies.      It could be that he developed shock liver from his sepsis syndrome.      At this time, we have instructed him to discontinue Crestor until      he follows up with Dr. Lysbeth Galas.  He should have a repeat of his      liver function studies and if stable this can safely be resumed.  10.Depression:  Patient was maintained on his usual dose of Lexapro.  11.Prostatomegaly:  Patient had no urinary obstruction while in the      hospital.  We recommend routine screening for prostate cancer by      his primary care physician if deemed necessary.   DISPOSITION:  Patient is medically stable and will be discharged home.  He is ambulating in the hallway with a walker.  He has followup  scheduled for tomorrow with a repeat PT/INR and recommendations to have  this drawn daily until his INR is stable.  He has followup with Dr.  Lysbeth Galas scheduled on Apr 21, 2009 at 10 a.m.   TIME SPENT COORDINATING CARE  FOR DISCHARGE AND DISCHARGE INSTRUCTIONS:  Equals 45 minutes.      Hillery Aldo, M.D.  Electronically Signed     CR/MEDQ  D:  04/14/2009  T:  04/14/2009  Job:  [id number]   cc:   Delaney Meigs, M.D.  Fax: [phone]   Nicki Guadalajara, M.D.  Fax: [phone]

## 2011-04-12 NOTE — H&P (Signed)
NAMEGERARDO, Kenneth [name]   MEDICAL RECORD NO.:  [id number]          PATIENT TYPE:  INP   LOCATION:  1615                         FACILITY:  [facility]   PHYSICIAN:  Alvy Beal, MD    DATE OF BIRTH:  1946-01-10   DATE OF ADMISSION:  03/23/2009  DATE OF DISCHARGE:                              HISTORY & PHYSICAL   REASON FOR ADMISSION:  Severe low back pain and right leg pain.   HISTORY:  Albino is a very pleasant 65 year old gentleman who has a  history of severe chronic debilitating back pain.  He was in the process  of seeing a chiropractor over the last 2 weeks when late last week he  developed severe back pain.  He went to the emergency room on March 22, 2009 and was given appropriate pain medication.  He has been followed by  Dr. Fannie Knee (orthopedic surgeon) and presents on the early morning March 24, 2009 because of severe unrelenting back and right leg pain.  Because  this is his second visit to the emergency room because of the same  problem, we elected to admit him.  I was consulted because Dr. Fannie Knee was a  former member of [employer].  The patient states that he has  intractable back pain.  He is unable to function at home.   PAST MEDICAL, SURGICAL, FAMILY AND SOCIAL HISTORY:  1. Includes bypass surgery, 3-vessel.  2. He has hypertension.  3. Hyperlipidemia.  4. He denies tobacco and alcohol and illicit drug use.   MEDICATIONS:  The medications include Avalide, baby aspirin, Crestor,  Lexapro, metoprolol tartrate, hydrocodone, Plavix and Niaspan.   CLINICAL EXAMINATION:  He is pleasant gentleman who appears his stated  age.  He is now comfortable with the reduced dose Dilaudid PCA and  Decadron.  He is grossly neurologically intact.  He has no focal motor  or sensory deficits.  Cranial nerves II-XII were grossly intact.  He  does have some difficulty hearing.  He has a positive straight leg raise  test, right side, with  reproduction of back and horrific leg pain at  about 35 degrees of elevation.  No joint pain with hip, knee or ankle  range of motion or palpation.  Significant back pain, especially with  extension of the spine.  ABDOMEN:  Soft and nontender.  No history of incontinence of bowel and  bladder.  Negative Babinski sign.  No clonus.  Diminished but symmetrical deep  tendon reflexes throughout.   At this point in time I did review the patient's MRI that he obtained  last night.  There is severe critical stenosis at L4-5 with a disk  herniation that exceeds down caudally.  At 5-1 there is also moderate to  severe stenosis.  No spondylolisthesis or spondylolysis.  There are  advanced degenerative changes at the 4-5 and 5-1 disk spaces as well.   At this point in time the patient was actually set up to see my partner  at [facility] today.  I will discuss definitive management  with him.  In  either respect, we will keep him in the hospital since he  is comfortable and I will contact Dr. Tresa Endo, his cardiologist, about  whether or not he can safely stop the Plavix for definitive  surgical management.  The patient indicates that he actually did not  take his dose yesterday.  We will continue to manage his pain and will  determine a definitive course of action whether that is with my partner  Dr. Shelle Iron or myself.      Alvy Beal, MD  Electronically Signed     DDB/MEDQ  D:  03/24/2009  T:  03/24/2009  Job:  [id number]

## 2011-04-12 NOTE — Consult Note (Signed)
[name], [name]   MEDICAL RECORD NO.:  [id number]          PATIENT TYPE:  INP   LOCATION:  1615                         [facility]ACILITY:  [facility]   PHYSICIAN:  Theodosia Paling, MD    DATE OF BIRTH:  18-Jul-1946   DATE OF CONSULTATION:  03/24/2009  DATE OF DISCHARGE:                                 CONSULTATION   REFERRING PHYSICIAN:  Jene Every, M.D.   REASON FOR REFERRAL:  Co-management of medical condition.   HISTORY OF PRESENT ILLNESS:  Kenneth Scott is a very pleasant 65 year old  gentleman who has been admitted to the hospital with severe chronic  debilitating back pain with acute exacerbation for 2 weeks.  Back MRI  showing severe foraminal stenosis.  Orthopedics has admitted the patient  for spinal decompression.  He does not have any neurological deficit  associated with it.  However, he does have a history of coronary artery  bypass surgery performed in 2005 by Dr. Evelene Croon, where he also had  a bioprosthetic heart valve replaced, aortic valve replaced.  Given his  comorbid condition, [employer] was consulted for  further evaluation and management.   REVIEW OF SYSTEMS:  Patient denies any chest pain, PND, orthopnea, fever  or chills, any neurological deficit.  The rest of the review of systems  is obtained in detail and is essentially negative.   PAST MEDICAL HISTORY:  1. Coronary heart disease, status post CABG.  2. Severe stenosis, status post replacement.  3. Hypertension.  4. Hyperlipidemia.  5. Occasional exacerbations of gout.  6. History of kidney stones.   HOME MEDICATIONS:  1. Avalide 150/12.5 mg p.o. daily.  2. Bayer Aspirin 81 mg p.o. daily.  3. Crestor 10 mg p.o. q.h.s.  4. Lexapro 10 mg p.o. daily.  5. Metoprolol tartrate 25 mg p.o. q.12h.  6. Hydrocodone/acetaminophen 1 tab p.o. q.4-6h. p.r.n.  7. Plavix 75 mg p.o. daily.  8. Niaspan Extended Release 100 mg p.o. q.h.s.   CURRENT  MEDICATIONS:  Include all of the above, except Plavix has been  discontinued, and dexamethasone has been added for spinal cord  compression.   PAST SURGICAL HISTORY:  1. CABG, as mentioned in past medical history.  2. Thirty-five years back, he has undergone some sort of back surgery.   ALLERGIES:  Patient is not allergic to any medication.   SOCIAL HISTORY:  Patient lives at home with his girlfriend.  Has grown-  up kids.  Does not have a history of tobacco, alcohol, IV drugs.   FAMILY HISTORY:  Patient father had a history of stroke.  Patient's  brother had cancer.  Patient's sister died of cancer too.  Patient's  mother had a pacemaker, but she died in her 80's.   PHYSICAL EXAMINATION:  VITAL SIGNS:  Temperature 97.9, heart rate 62,  respiratory rate 18, blood pressure 100/59.  Oxygen saturation 93% on 2  liters.  No acute cardiorespiratory distress.  LUNGS:  Normal breath sounds.  No rhonchi or crepitations.  CARDIOVASCULAR:  S1 and S2 normal.  No murmurs, rubs or gallops.  GI:  Soft, nontender.  No organomegaly.  PSYCH:  Oriented x3.  CNS:  Motor and sensory intact.  Nonfocal.  EXTREMITIES:  No pedal edema.   LAB DATA:  WBC 6, hemoglobin 14, hematocrit 40.8, platelet count 142.  Sodium 143, potassium 3.6, chloride 106, bicarbonate 29, BUN 23,  creatinine 1.19.  Serum glucose 115.   IMAGING:  Chest x-ray done on March 24, 2009 showing no acute  cardiopulmonary process.   MRI of the back showing severe multiple levels of spinal stenosis and  foraminal stenosis at L4-5, resulting in neurocompression and right  posterior,dyskinesia at L5-S1 with compressing right S1 nerve root.  Neural foramina narrowing bilaterally, could affect either L5 nerve  root, narrowing of the neural foramina at L3-4 that could be  symptomatic.   ASSESSMENT/PLAN:  1. Preoperative evaluation for spinal stenosis.  Patient is high-risk      for surgery, given his age and comorbid condition.  We  would      include a followup.  They can proceed with surgery; however,      closely follow him up during the postoperative course.  According      to him, his recent chest and echo have been fine, so we would      retrieve the data from Dr. Landry Dyke office, and we will discussed      with Dr. Tresa Endo in the morning.  He does not have any sign of acute      cardiac decompensation or acute ischemia.  At this time, he appears      to be hemodynamically stable.  Continue current cardioprotective      medication.  Continue to hold Plavix at this time, as desired by      orthopedics.  2. Coronary artery disease:  Continue cardioprotective medication      except for aspirin and Plavix.  Will monitor him closely.  3. Hypertension:  Blood pressure is adequately controlled.  Continue      metoprolol at this time, as it is a very important cardioprotective      medication for him at this time.  4. Hyperlipidemia:  Continue statin.  5. Prophylaxis:  Deep venous thrombosis and gastrointestinal      prophylaxis, to include SCDs, given his risk of      bleeding postoperatively.  6. Code status:  Patient is full code.   Total time spent in consultation of this patient around an hour.      Theodosia Paling, MD  Electronically Signed     NP/MEDQ  D:  03/24/2009  T:  03/24/2009  Job:  [id number]   cc:   Jene Every, M.D.  Fax: [phone]   Nicki Guadalajara, M.D.  Fax: [phone]   Delaney Meigs, M.D.  Fax: [phone]

## 2011-04-12 NOTE — Group Therapy Note (Signed)
NAME:  Kenneth Scott, Kenneth Scott NO.:  [id number]   MEDICAL RECORD NO.:  [id number]          PATIENT TYPE:  INP   LOCATION:  1507                         FACILITY:  [facility]   PHYSICIAN:  Hillery Aldo, M.D.   DATE OF BIRTH:  [birth year]                                 PROGRESS NOTE   DATE OF DISCHARGE:  Pending.   PRIMARY CARE PHYSICIAN:  Dr. Lysbeth Galas.   DISCHARGE DIAGNOSES:  1. Right lower lobe pneumonia.  2. Pulmonary infarction and hemorrhage.  3. Right pulmonary embolism.  4. Right lower extremity deep venous thrombosis.  5. A 3.4 cm infrarenal abdominal aortic aneurysm.  6. Peripheral vascular disease.  7. Coronary artery disease.  8. Hypertension.  9. Acute renal failure.  10.Gout.  11.Depression.  12.Dyslipidemia.  13.Constipation.  14.Hypokalemia.  15.Recent spinal surgery for stenosis.   DISCHARGE MEDICATIONS:  Will be dictated at the time of actual discharge.   CONSULTATIONS:  1. Dr. Jacinto Halim of cardiology.  2. Dr. Shelle Iron of orthopedic surgery.   BRIEF ADMISSION HISTORY OF PRESENT ILLNESS:  The patient is a 65 year old male who underwent spinal surgery on March 26, 2009.  He was doing well postoperatively but developed a severe  right-sided flank pain.  It was pleuritic in nature.  Upon initial  evaluation in the emergency department, he was found to have diffuse air  space disease in the right lower lobe and subsequently was admitted for  further evaluation and workup.  For the full details, please see the  dictated report done by Dr. [place] Callas.   PROCEDURES AND DIAGNOSTIC STUDIES:  1. Chest x-ray on Apr 05, 2009 showed low volume chest with bibasilar      atelectasis.  Postoperative changes of CABG.  Torturous thoracic      aorta accentuated by low lung volumes.  2. CT scan of the abdomen and pelvis on Apr 06, 2009 showed marked      dependent atelectasis in the lungs.  Diffuse air space disease in      the posterior basal segment of the right lower  lobe.  Likely      representative of infection or aspiration.  Mass in the right lower      lobe bronchovascular bundle.  Differential considerations include      small mass lesion, focal consolidation or potentially pulmonary      embolus.  Hepatic and renal cysts.  Small hiatal hernia.  A 3.4 cm      infrarenal abdominal aortic aneurysm.  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  Postoperative changes of L4-L5.  Presumed      iatrogenic air in the urinary bladder.  3. CT angiogram of the chest on Apr 06, 2009 showed a pulmonary      embolus especially in the right lower lobe pulmonary artery and      branches, with some extension into the right middle lobe.  Clot      burden is moderate and there is extensive airspace opacity in  the      right lower lobe which may be due to pulmonary hemorrhage or      infection.  Small right pleural effusion.  There is atelectasis and      volume loss and portions of the right upper lobe and left lower      lobe.  4. Doppler studies of the left lower extremity and right lower      extremity on Apr 06, 2009 showed acute deep vein thrombosis      involving the right lower extremity.  Left lower extremity is      clear.  No evidence of Baker cyst on the right or left.   DISCHARGE LABORATORY VALUES:  Will be dictated time of actual discharge.   HOSPITAL COURSE BY PROBLEM:  1. Right lower lobe pneumonia versus infarct versus hemorrhage:  The      patient was admitted and empirically put on broad-spectrum      antibiotics given his recent hospitalization.  He did have fever      and leukocytosis making pneumonia a distinct consideration.  CT      scan of the chest could not adequately discern between these      possible etiologies.  Nevertheless, we will keep the patient on      antibiotic therapy as his white blood cell count is decreasing over      time.  He is currently  on day four of vancomycin and Zosyn at a      planned course of 7 days of therapy.  2. Right lower extremity deep vein thrombosis/pulmonary embolism:  The      patient was put on therapeutic dose Lovenox and Coumadin.  His INR      has reached a therapeutic threshold.  We will discontinue the      Lovenox in 24 hours if he remains therapeutic.  He will remain on      Coumadin for at least 6 months.  3. Peripheral vascular disease/infrarenal aortic abdominal      aneurysm/iliac aneurysms:  The patient's blood pressure was well      controlled throughout his hospital stay.  He was stable with regard      to this issue.  4. Coronary artery disease:  The patient has a history of CABG.  He is      stable.  Because of the concern of prior treatment with aspirin and      Plavix in the setting of the need for Lovenox and Coumadin, the      patient's cardiologist was consulted to help determine if aspirin      and Plavix would continue to be needed.  Dr. Jacinto Halim felt that low-      dose aspirin could be continued and Plavix held.  5. Hypertension:  The patient's Avalide was held secondary to rising      creatinine.  He was put on his usual dose of metoprolol and is      titrated up for better blood pressure control.  6. Acute renal failure:  The patient did develop some mild acute renal      failure with a rise in his baseline creatinine to approximately 20-      25% over baseline.  His Avalide been discontinued and his      creatinine is trending down toward baseline.  7. Gout:  The patient was maintained on his usual dose of colchicine.      He also reports having been  treated with niacin in the outpatient      environment.  Niacin can cause hyperuricemia and unless he cannot      be controlled on other medications, would discontinue niacin.  8. Depression:  Patient is maintained on his usual dose of Lexapro.      His mood has been stable.  9. Dyslipidemia:  The patient was maintained on statin  therapy.  He is      on 10 mg Crestor daily.  His primary care physician should see if      he can obtain adequate lipid control with up titration of the crest      toward given the fact that he does have gout and the niacin does      cause hyperuricemia.  10.Constipation.  The patient is actively on a bowel regimen and we      have stepped this up due to his ongoing constipation.  We will      continue to monitor him closely and provide medications as needed.  11.Hypokalemia:  Patient is on replacement therapy.  We will check a      magnesium at this time to see if he is magnesium deficient.  12.Recent spinal surgery:  The patient's surgeon, Dr. Shelle Iron has been      notified of his admission and has been following him along for      postoperative concerns.   DISPOSITION:  The patient may reach medical stability over the next several days and,  if so, he will be discharged home.  A discharge summary addendum will be  dictated at that time.      Hillery Aldo, M.D.  Electronically Signed     CR/MEDQ  D:  04/09/2009  T:  04/09/2009  Job:  [id number]   cc:   Delaney Meigs, M.D.  Fax: [phone]

## 2011-04-12 NOTE — Op Note (Signed)
Kenneth Scott, [name]   MEDICAL RECORD NO.:  [id number]          PATIENT TYPE:  INP   LOCATION:  1615                         FACILITY:  [facility]   PHYSICIAN:  Jene Every, M.D.    DATE OF BIRTH:  12/21/1945   DATE OF PROCEDURE:  03/26/2009  DATE OF DISCHARGE:                               OPERATIVE REPORT   PREOPERATIVE DIAGNOSES:  1. Spinal stenosis L4-5, L5-S1, right.  2. Herniated nucleus pulposus L5-S1, right.   POSTOPERATIVE DIAGNOSES:  1. Spinal stenosis L4-5, L5-S1, right.  2. Herniated nucleus pulposus L5-S1, right.  3. Stenosis 3-4.   PROCEDURE:  1. Central decompression at L4-5 with bilateral hemilaminotomies,      lateral recess decompression, foraminotomies L5.  2. Laminectomy of 4 and central decompression of 3-4.  3. Hemilaminectomy L5-S1 on the right with microdiskectomy L5-S1,      foraminotomy L5 on the right.   Technical difficulty increased due to the patient's severe spinal  stenosis and his history of previous surgery at L5-S1.  Blood loss was  100 mL.   ANESTHESIA:  General.   ASSISTANT:  Dr. Darrelyn Hillock.   BRIEF HISTORY:  The patient is a 65 year old with severe spinal stenosis  at 4-5, extruded HNP at 5-1 compressing the 5 and S1 nerve roots and the  lateral recess.  Had neural foraminal stenosis at 5 compressing the root  as well as a large disk herniation, severe stenosis at 3-4.  He had a  year of antecedent claudication.  He has a history of a decompression L5-  S1 on the left.  He had minimal left-sided symptoms.  He was indicated  for decompression at 4-5, possible lateral mass fusion, decompression L5-  S1 on the right.  Risks and benefits discussed including bleeding,  infection, damage to neurovascular structures, CSF leakage, epidural  fibrosis, adjacent segment disease, and need for fusion and future  anesthetic complications, etc.   TECHNIQUE:  The patient was placed in supine position.  After  induction  of adequate anesthesia and 1 g of Kefzol, he was placed prone on the  [place] frame.  All bony points were well padded.  Lumbar region prepped  in the usual sterile fashion.  Two 18-gauge spinal needles utilized to  localize the 4 and 5 spinous processes.  Incision was made from the  spinous process of 3 to S1.  Subcutaneous tissue was dissected.  Electrocautery was utilized to achieve hemostasis.  Dorsolumbar fascia  identified and divided in line with the skin incision.  Paraspinous  muscle elevated from the lamina of 3-4, 4-5 and 5-1 bilaterally.  Kocher's were placed in spinous processes of 4 and 5, confirmed by x-  ray.  McCullough retractor was placed.  Operating microscope draped and  brought into the surgical field.  I felt due to the severe nature of the  spinal stenosis we would perform a central laminectomy of 4.  Spinous  process of 4 and 5 were removed.  Good quality of bone was noted.  Initially in the subcutaneous tissue, he had mild bleeding noted.  Mild  bleeding  noted in the paraspinous dissection.  The patient had been 4  days off Plavix.  After removing the spinous processes of 4 and 5, we  then used a 2-mm Kerrison and starting centrally progressed cephalad,  performed a central decompression by removal of central lamina of 4 and  severe stenosis noted.  Following the removal of central portion of 4,  we then proceeded bilaterally, decompressing and removing the entire  lamina of 4.  The ligamentum flavum at 3-4 was removed as well, as there  was stenosis noted here as well.  This was carried up to the 3 lamina.  This was to mobilize the thecal sac cephalad to decrease tension of the  stenotic segment at 4-5.  Neural elements were well protected.  We then  removed ligamentum flavum from the interspace of 4-5, again utilizing  the operating microscope.  There was severe stenosis noted bilaterally.  Meticulous dissection was utilized to decompress the lateral  recesses to  the medial border the pedicle.  Foraminotomies of 4 were performed.  No  HNP was noted at 4-5.  Hockey-stick probe passed freely out the foramen  of 4 and 3.  This following the decompression.  Next, the hemilamina of  5 was then removed.  We left hemilamina of 5 on the left.  He did have a  history of decompression L5-S1 on the left.  It was unclear whether he  had a dural bleb there or not, from the MRI.  After removal hemilamina,  we noted severe ligamentum flavum hypertrophy had folded in the foramen  of 5, compressing the 5 root.  We performed a foraminotomy L5.  The S1  nerve was flattened against the lateral recess of the facet and an HNP.  After I decompressed the lateral recess of the medial border of the  pedicle and performed a foraminotomy of S1, we gently mobilized the S1  nerve root medially, found a focal HNP, mobilized it with a nerve hook  and removed it with micro pituitary.  Three large fragments were  removed.  I then entered the disk space, and 2 additional fragments were  removed from the disk space.  The disk space was further mobilized and  used a micro pituitary to remove the remainder of the disk herniation.  The disk space was well collapsed.  The foramen of 5 opened well.  The  S1 nerve root was fairly edematous.  We had at least a centimeter of  excursion immediately to the pedicle without difficulty.  Hockey stick  probe then passed freely up the foramen of 5 and S1, indicating adequate  decompression.  There was a significant indentation into the shoulder of  5 root and into the S1 nerve root, both by the foraminal stenosis and  the ligamentum flavum buckling.  Wound was copiously irrigated  throughout.  A hockey stick probe placed in the foramen of 3, 4, and 5  bilaterally and we found to be widely patent.  Inspection revealed no  CSF leakage or active bleeding.  Bipolar electrocautery had been  utilized to achieve hemostasis throughout.    Next, the bone was placed on the cancellous surfaces, used thrombin-  soaked Gelfoam.  Electrocautery was utilized to achieve strict  hemostasis.  Due to recent use of Plavix, we used FloSeal.  We had  achieved strict hemostasis at the conclusion of the case.  I removed the  McCullough retractor.  Copious irrigation was utilized.  Inspection  revealed no evidence of  active bleeding.  No evidence of CSF leakage.  We placed a Hemovac, brought it out through a lateral stab wound in the  skin just as a backup.  I then repaired the dorsolumbar fascia with #1  Vicryl figure-of-eight sutures.  Subcutaneous tissue reapproximated with  2-0 Vicryl simple sutures.  The skin was reapproximated with staples.  Wound was dressed sterilely.  Placed supine on the hospital bed,  extubated without difficulty, transferred to recovery in satisfactory  addition.   The patient tolerated the procedure well with no complications.  Assistant, Dr. Darrelyn Hillock, used the operating microscope.      Jene Every, M.D.  Electronically Signed     JB/MEDQ  D:  03/26/2009  T:  03/26/2009  Job:  [id number]

## 2011-04-12 NOTE — H&P (Signed)
Kenneth Scott, Kenneth Scott NO.:  [id number]   MEDICAL RECORD NO.:  [id number]          PATIENT TYPE:  INP   LOCATION:  1507                         FACILITY:  [facility]   PHYSICIAN:  Pedro Earls, MD     DATE OF BIRTH:  [birth year]   DATE OF ADMISSION:  04/05/2009  DATE OF DISCHARGE:                              HISTORY & PHYSICAL   ATTENDING PHYSICIAN:  [facility]   PRIMARY CARE PHYSICIAN:  Dr. Lysbeth Galas.   CHIEF COMPLAINT:  Right-sided flank pain.   HISTORY OF PRESENT ILLNESS:  This is a 65 year old white male patient  with a past medical history significant for coronary artery disease  status post CABG and aortic valve replacement who was recently  discharged from the hospital after a 7-day period of admission for a  back surgery leading to laminectomy and diskectomy. He was presented  with chief complaint of the right-sided flank pain.  The patient also  complaining of some component to the pain when the patient takes a deep  breath.  Denies any cough. Complaining of shortness of breath  complaining of fever with chills.  No diarrhea, nausea, vomiting.   REVIEW OF SYSTEMS:  As above.  Rest of the review of systems is  negative.   PAST MEDICAL HISTORY:  Coronary artery disease status post CABG and  aortic valve replacement, human AVR, hypertension, depression, gout,  kidney stones, hyperlipidemia.   PAST SURGICAL HISTORY:  Back surgery x2. CABG x5 and AVR.   SOCIAL HISTORY:  Nonsmoker, nonalcoholic, no IV drug abuse.   FAMILY HISTORY:  Noncontributory.   ALLERGIES:  NKDA.   MEDICATIONS:  Avalide 150/12.5 once daily.  Crestor 10 mg q.h.s.,  Lexapro 10 mg daily, metoprolol 25 b.i.d., oxycodone with Tylenol 10/325  one tablet q.4-6 h. p.r.n., Robaxin 5 mg p.o. q.8 h. P.r.n., colchicine  0.6 mg t.i.d.   PHYSICAL EXAM:  VITALS:  Temperature T-max is 100.2, respirations 18,  pulse is 60's-80's, blood pressure 77-108/50's, pulse ox 96% on 2 liters  nasal cannula.  GENERAL:  Patient awake, alert and oriented x3.  Does appear to be in  some distress secondary to pain in the right flank.  HEENT::  Pupils equal, round and reactive to light.  No icterus.  Mild  pallor.  Extraocular muscles are intact.  Oral mucosa is dry.  NECK:  Supple.  No JVD.  No lymphadenopathy.  CARDIAC:  S1, S2.  Sinus tach to regular.  No murmur, heaves or gallops.  CHEST:  Bibasilar crackles more pronounced right lung field.  ABDOMEN:  Soft, nontender.  Bowel sounds present.  No  hepatosplenomegaly.  EXTREMITIES:  Peripheral pulses present.  The patient has some trace  edema and erythema at the right lower extremity. No cyanosis or  clubbing.  CNS: Sensorimotor grossly intact. Cranial nerves II through XII are  intact.  SKIN:  No rashes.  MUSCULOSKELETAL:  Unremarkable.   LABS:  The patient's CT scan of the chest, abdomen and pelvis with  contrast revealed marked dependent atelectasis in the lungs, diffuse air  space disease in the right lower  lobe, questionable aspiration.  There  is a mass in the right lower lobe bronchovascular bundle, questionable  PE or small mass lesion. CT angiogram is recommended for that.  CT pelvis revealed bilateral aortic artery aneurysm measuring 2.2 cm in  proximal left and distal right common iliac arteries. UA is negative for  UTI. Potassium 3.5, creatinine was 1.23.  White count is 17.3 with a  left shift.  H and H are 13 and 37.3.   IMPRESSION:  1,  Hospital-acquired pneumonia with sepsis  1. Dehydration.  2. Rule out deep vein thrombosis.  3. Rule out pulmonary embolus.  4. Coronary artery disease.  5. Hyperlipidemia.   PLAN:  Admit to Med Surg.  Start IV Zosyn and vancomycin, IV fluids with  potassium.  Follow blood cultures. Since the patient has a clinical  finding suggestive of infectious etiology at this point, I would go  ahead and start the patient on prophylactic dose of Lovenox, get venous  Doppler. If it  is negative and the patient is well-hydrated we can go  ahead and get a CT angiogram of the chest tomorrow or the day after and  proceed with anticoagulation therapeutically if one of these tests is  positive.      Pedro Earls, MD  Electronically Signed     NS/MEDQ  D:  04/06/2009  T:  04/06/2009  Job:  [id number]   cc:   Delaney Meigs, M.D.  Fax: [phone]

## 2011-04-15 NOTE — Consult Note (Signed)
NAME:  Kenneth Scott, Kenneth Scott                         ACCOUNT NO.:  [id number]   MEDICAL RECORD NO.:  [id number]                   PATIENT TYPE:  INP   LOCATION:  2928                                 FACILITY:  [facility]   PHYSICIAN:  Evelene Croon, M.D.                  DATE OF BIRTH:  02-12-1946   DATE OF CONSULTATION:  07/19/2004  DATE OF DISCHARGE:                                   CONSULTATION   REASON FOR CONSULTATION:  Severe three-vessel coronary artery disease status  post acute anterior myocardial infarction, aortic stenosis and ascending  aortic dilatation.   HISTORY OF PRESENT ILLNESS:  This patient is a 65 year old gentleman with  history of murmur and mild aortic stenosis who has been followed in the  [facility] office by Dr. Tresa Endo.  He had an  echocardiogram done on Apr 03, 2002 which showed mild aortic stenosis with an  aortic valve area of 1.8 sq cm with peak gradient of 27.6 and mean gradient  of 13.  The aortic valve was moderately calcified, particularly on the right  coronary cusp.  There was also moderately enlarged ascending aorta measuring  4.6 cm which was unchanged from prior study of August 2001.  There was also  mild tricuspid regurgitation and mild mitral regurgitation.  The patient was  admitted last night after developing acute onset of chest pain and pressure  while sitting in church.  The pain radiated down both arms and he became  diaphoretic.  EMS brought him to [facility] where he was diagnosed with an  acute anterior myocardial infarction by electrocardiogram.  He was treated  with intravenous heparin and nitroglycerin and beta blocker and taken to the  cath lab.  Cardiac catheterization showed about 30% distal left main  stenosis.  The LAD was heavily calcified in its proximal portion with about  50% stenosis before the first diagonal branch.  There is about 60-70%  stenosis at the takeoff of the second diagonal branch.  The first  diagonal  had 80-90% proximal stenosis and the second diagonal had 90% stenosis.  The  LAD was occluded after the septal perforator.  The left circumflex was a  small vessel that had no significant disease.  The right coronary artery was  a large dominant vessel that had 50% proximal and mid stenosis.  There was  80% posterior descending stenosis.  The LAD was opened with percutaneous  transluminal coronary angioplasty and was a large distal vessel.  Ejection  fraction was about 45%.  There was aortic valve calcification with 1-2+  aortic insufficiency.  The gradient across the aortic valve was about 27  peak and 20 mean.  The ascending aorta appeared enlarged.  The patient  became pain free.  He has remained pain free since catheterization.  His  peak CPK this morning was 2440 with MB of 366.   His past medical  history is significant for hypertension.  He has history of  aortic stenosis as mentioned above.  He has had a Cardiolite stress test  less than a year ago that was reportedly negative.  He has history of kidney  stones.  He has history of lower back surgery in the past and surgery on his  left hand for injury from chain saw.   His review of systems is as follows:  GENERAL:  He denies fever or chills.  He has had no recent weight changes.  He has had fatigue for years.  EYES:  Negative.  ENT:  Negative.  He sees his dentist regularly every six months  and is due in September.  ENDOCRINE:  He denies diabetes and hypothyroidism.  CARDIOVASCULAR:  He denies chest pain or pressure.  He does have indigestion  with gas frequently.  He has exertional shortness of breath and fatigue  which he has had for years.  He has not noticed any recent change in his  exercise tolerance.  He denies peripheral edema and palpitations.  RESPIRATORY:  He denies cough and sputum production.  GI:  He denies nausea,  vomiting.  He has had indigestion.  He denies melena and bright red blood  per rectum.   GU:  He denies dysuria and hematuria.  NEUROLOGIC:  He denies  any focal weakness or numbness.  He denies dizziness and syncope.  He has  never had a transient ischemic attack or stroke.  ALLERGIES:  None.  PSYCHIATRIC:  Negative.  MUSCULOSKELETAL:  He does have lower back pain.  VASCULAR:  Denies claudication and phlebitis.   MEDICATIONS:  He occasionally takes aspirin and occasionally takes a nerve  pill.  He takes a blood pressure pill daily which he does not remember what  it is.   SOCIAL HISTORY:  He is divorced and is here with his girlfriend.  He has two  children and two grandchildren.  He works as a Soil scientist in heavy physical  labor. He does not smoke tobacco.  He denies alcohol abuse.   FAMILY HISTORY:  Positive for heart disease in his mother who was in her 36s  and his father had a stroke.   PHYSICAL EXAMINATION:  VITAL SIGNS:  Blood pressure 110/60 with pulse of 55  and regular.  Respiratory rate is 16 and unlabored.  Oxygen saturation on  two liters is 95%.  GENERAL:  He is a well-developed white male in no distress.  HEENT:  Normocephalic, atraumatic.  Pupils are equal, reactive to light and  accommodation.  Extraocular muscles are intact.  His throat is clear.  NECK:  Normal carotid pulses bilaterally.  There are no bruits.  There is no  adenopathy or thyromegaly.  CARDIAC:  Regular  rate and rhythm with normal S1 and S2.  There is no  murmur, rub or gallop.  LUNGS:  Clear.  ABDOMEN:  Active bowel sounds.  Soft and nontender.  There are no palpable  masses or organomegaly.  EXTREMITIES:  Shows no peripheral edema.  Pedal pulses are palpable  bilaterally.  SKIN:  Warm and dry.  NEUROLOGIC:  He seems to alert and oriented x3.  Motor and sensory exams are  normal.   Laboratory examination shows normal electrolytes with BUN 15, creatinine  1.2.  White blood cell  count 12.6, hemoglobin 15.3, coagulation profile was normal.  TSH was 3.89 which is within normal limits.   His LFTs were mildly  elevated with an SGOT of 203, SGPT  52.  Albumin 3.3 and bilirubin is 1.0.  Lipid profile is pending. Chest x-ray is pending.  Electrocardiogram today  shows sinus bradycardia at 54 with acute anterior infarction.   IMPRESSION:  This patient has severe three-vessel coronary artery disease as  well as mild aortic stenosis and aortic insufficiency and a dilated  ascending aorta.  I agree that coronary artery bypass graft surgery is the  best treatment for this patient to prevent further ischemia and infarction.  We will further evaluate his aortic valve and ascending aorta to determine  whether these need to be replaced at the same time.  We will review his  echocardiogram done today and he may require CT scan or MR angiogram of the  thoracic aorta to  help determine whether to replace this at the same time.  I discussed all this with he and his girlfriend and the plans for further  workup and treatment.  They understand and are in agreement.                                               Evelene Croon, M.D.    BB/MEDQ  D:  07/19/2004  T:  07/20/2004  Job:  [id number]

## 2011-04-15 NOTE — Op Note (Signed)
NAME:  Kenneth Scott, Kenneth Scott                         ACCOUNT NO.:  [id number]   MEDICAL RECORD NO.:  [id number]                   PATIENT TYPE:  INP   LOCATION:  2313                                 FACILITY:  [facility]   PHYSICIAN:  Quita Skye. Krista Blue, M.D.               DATE OF BIRTH:  [birth year]   DATE OF PROCEDURE:  07/29/2004  DATE OF DISCHARGE:                                 OPERATIVE REPORT   PROCEDURE:  Transesophageal echocardiogram.   Mr. Kenneth Scott is a 65 year old white male with coronary artery disease,  aortic valve regurgitation, and an ascending aortic aneurysm.  Dr. Rexanne Mano has requested TEE for interoperative management of this patient.  Following routine cardiac induction, the transesophageal probe was covered  with a plastic non-latex sheath, lubricated and inserted through a mouth  bite guard into the esophagus for imaging.  Overall images of the heart  showed no evidence of pericardial effusion.  The right atrium was normal in  size and had no evidence for masses or thrombus.  The tricuspid valve showed  trace regurgitation with the pulmonary artery catheter passing through the  valve.  The right ventricle had good contractility, no evidence of masses.  The atrial septum was difficult to visualize because of the aneurysm but  appeared to have no septal defect.  The right atrium was mildly obliterated  by the aneurysm.  The left atrium appeared to be normal in size without  thrombus or masses.  Pulmonary venous flows were normal.  Mitral valve  showed some prolapse of the anterior leaflet but trace to 1+ mitral  regurgitation.  Upon looking at the left ventricular outflow tract, this  prolapse appeared to be related to the aortic regurgitant jet which tracked  along the anterior leaflet of the mitral valve.  The left ventricle was  slightly enlarged with a wall size of 1.3.  Overall contractility was good.  There was no evidence of segmental wall motion abnormalities  at this time.  The aortic valve was then evaluated which appeared to be bicuspid.  The  opening of the mitral valve under the aortic valve showed some mild stenosis  with a fusion of the right and left coronary cusp but there was severe  regurgitation that was eccentric in nature and traveled along the anterior  mitral valve.  The annulus of the aortic valve was 2.19 cm and there was no  evidence of a dissection.  The ascending aorta was then evaluated which  showed a diameter of 5 cm without intimal flap.  The descending thoracic  aorta was normal in size with some mild atherosclerotic changes.  Following  coronary artery bypass grafting and aortic valve conduit replacement, the  patient successfully separated from the cardiopulmonary bypass machine.  Evaluation of the aortic valve showed good function without evidence of  regurg or stenosis.  Peak flows were less than 200 in the deep view.  Mitral  valve had no regurgitation and there was significantly reduced prolapse of  the anterior leaflet of the mitral valve.  The volume status was gauged and  left ventricular  function.  Following the surgery, the probe was carefully  removed and the patient was taken to the SICU without apparent  complications.                                               Quita Skye Krista Blue, M.D.    JDS/MEDQ  D:  07/29/2004  T:  07/30/2004  Job:  [id number]

## 2011-04-15 NOTE — Op Note (Signed)
NAME:  Kenneth Scott, Kenneth Scott                         ACCOUNT NO.:  [id number]   MEDICAL RECORD NO.:  [id number]                   PATIENT TYPE:  INP   LOCATION:  2313                                 FACILITY:  [facility]   PHYSICIAN:  Evelene Croon, M.D.                  DATE OF BIRTH:  11-07-1946   DATE OF PROCEDURE:  07/29/2004  DATE OF DISCHARGE:                                 OPERATIVE REPORT   PREOPERATIVE DIAGNOSIS:  Severe three vessel coronary disease, mild aortic  stenosis and mild aortic insufficiency with ascending aortic aneurysm.   POSTOPERATIVE DIAGNOSIS:  Severe three vessel coronary disease, mild aortic  stenosis and mild aortic insufficiency with ascending aortic aneurysm.   PROCEDURE:  Median sternotomy, extracorporeal circulation, coronary artery  bypass graft surgery x6 using a left internal mammary artery to left  anterior descending coronary artery, with a sequential saphenous vein graft  to the first diagonal branch of the LAD and the obtuse marginal branch of  the left circumflex coronary artery, saphenous vein graft to the second  diagonal branch of the LAD and a sequential saphenous vein graft to the  posterior descending and posterolateral branches of the right coronary  artery.  Resection of ascending aortic aneurysm and replacement of aortic  valve using a 25 mm Homograft valve conduit with reimplantation of the  coronary arteries, endoscopic vein harvesting from the right leg.  Left  femoral arterial cannulation for cardiopulmonary bypass.   SURGEON:  Evelene Croon, M.D.   ASSISTANT:  Coral Ceo, P.A.C.   ANESTHESIA:  General endotracheal anesthesia.   INDICATIONS FOR PROCEDURE:  This patient is a 65 year old gentleman with a  history of heart murmur and mild aortic stenosis who has been followed in  the [facility] by Dr. Tresa Endo.  He had an  echocardiogram in May of 2003 which showed a mild aortic stenosis with an  aortic valve  area of 1.8 sq cm with a peak gradient of 27.6 and a mean  gradient of 13.  The aortic valve was moderately calcified.  There was also  moderately enlarged ascending aorta measuring 4.6 cm that was unchanged from  a prior study of 2001.  The patient was admitted on July 18, 2004, after  developing acute chest pain.  He was diagnosed with an acute anterior  myocardial infarction by electrocardiogram. Cardiac catheterization showed a  30% distal left main stenosis.  The LAD was heavily calcified in its  proximal portion with a 50% stenosis before the first diagonal branch.  There was about 60 to 70% stenosis at the takeoff of the second diagonal  branch.  The first diagonal had about 80 to 90% proximal stenosis and the  second diagonal about 90% stenosis.  The LAD was occluded after the septal  perforator.  The left circumflex was small vessel that had about 30% ostial  stenosis.  The right coronary artery  was a large dominant vessel that had a  50% proximal and mid vessel stenosis and about 80% posterior descending  stenosis.  The LAD was opened successfully with angioplasty and was a large  distal vessel.  Ejection fraction was about 45%.  The aortic valve was  calcified with 1 to 2+ aortic insufficiency.  The gradient across the aortic  valve was 20 mean and 27 peak.  The ascending aorta appeared aneurysmal.  His peak CPK was 2440 with an MB of 366.  He subsequently underwent a CT  scan of the chest that showed the maximum internal diameter of the proximal  ascending aorta was about 5.3 cm.  This aneurysm appeared to extend from the  level of the aortic root up to the takeoff of the innominate artery.  After  review of the angiogram and examination of the patient, it was felt that  coronary artery bypass graft surgery and replacement of his aortic valve and  ascending aortic aneurysm was the best treatment.  I discussed the operative  procedure with the patient including alternatives,  benefits and risks.  We  discussed the options for replacing his valve and aorta including a  mechanical valve conduit, a tissue valve conduit and a Homograft.  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  Given  his age of 65 years, I thought a reasonable alternative would be to use a  Homograft.  I discussed the operative procedure with he and his fiance  including the risk of bleeding, blood transfusion, infection, stroke,  myocardial infarction, graft failure, and eventual failure of the Homograft  requiring replacement.  They understood and agreed to proceed.   DESCRIPTION OF PROCEDURE:  The patient was taken to the operating room and  placed on the table in the supine position.  After induction of general  endotracheal anesthesia, Foley catheter was placed in the bladder using  sterile technique.  Then the chest, abdomen and both lower extremities were  prepped and draped in the usual sterile manner.  The transesophageal  echocardiogram was performed.  This showed the ascending aortic aneurysm  with mild aortic insufficiency and aortic stenosis.  __________ appear to be  a bicuspid aortic valve.  The anterior leaf of the mitral valve was slightly  prolapsed but there was no significant mitral regurgitation.  Left  ventricular function appeared improved.  His ejection fraction was markedly  reduced by cardiac catheterization  but he appeared to have good wall motion  abnormality now.  Right ventricle appeared to be functioning normally.   Then the chest was entered through a median sternotomy incision.  The  pericardium opened in the midline.  Examination of the heart showed good  ventricular contractility.  The ascending aorta was aneurysmal and this  appeared to extend up to the takeoff of the innominate artery.  I felt it  was necessary to cannulate the groin to have enough room to work in the  chest.  Then the left internal mammary artery was  harvested from the chest wall as a  pedicle graft.  This was a medium caliber vessel with excellent blood flow  through it.  At the same time, a segment of greater saphenous vein was  harvested from the right leg using endoscopic vein harvest technique.  Part  of this vein in the upper thigh was small and not suitable and therefore  another section of vein was harvested from the left lower  leg and this vein  was of medium size and good quality.   Then the patient was heparinized and when an adequate activated clotting  time was achieved, the left common femoral artery was exposed through a  vertical incision in the groin.  Proximal and distal control of the common  femoral, profunda femoral, and superficial femoral arteries was obtained.  Then the common femoral artery was cannulated using a 20 French arterial  cannula.  Venous cannulation was achieved in the chest using a two-stage  venous cannula through the right atrial appendage.  A antegrade cardioplegia  and vent cannula was inserted in the aortic root.  The left ventricular vent  was placed through the right superior pulmonary vein and retrograde  cardioplegia cannula through the right atrium and in the coronary sinus.   The patient was placed on cardiopulmonary bypass and distal coronary artery  was identified.  The aorta was then crossclamped and 500 mL of cold blood  antegrade cardioplegia was administered in the aortic root with slow arrest  of the heart.  There was significant aortic insufficiency and there was  difficulty maintaining good root pressure.  Therefore another 500 mL of cold  blood retrograde cardioplegia was given.  Systemic hypothermia was at 20  degrees C and topical hypothermia with iced saline was used.  Temperature  probe was placed in the septum and insulated pad in the pericardium.   Then the first distal anastomosis was performed to the first diagonal  branch. The internal diameter was about 1.6 mm.   Conduit used was a segment  of greater saphenous vein with the anastomosis performed in a sequential  side-to-side manner using continuous 7-0 Prolene suture.  Flow was measured  through the graft and was excellent.   The second distal anastomosis was performed of the obtuse marginal branch.  The internal diameter was about 1.6 mm.  Conduit used was the same segment  of greater saphenous vein and the anastomosis performed in a sequential end-  to-side manner using continuous 7-0 Prolene suture.  Flow was measured  through the graft and was excellent.  Then a dose of cardioplegia was given  down this vein graft.   The third distal anastomosis was performed on the posterior descending  coronary artery.  The internal diameter weakness 1.6 mm.  Conduit used was a  second segment of greater saphenous vein.  The anastomosis performed in a  sequential side-to-side manner using continuous 7-0 Prolene suture.  Flow was measured through the graft and was excellent.   The fourth distal anastomosis was performed to the posterolateral branch.  The internal diameter was about 1.75 mm.  The conduit used was the same  segment of greater saphenous vein.  The anastomosis performed in a  sequential end-to-side manner using continuous 7-0 Prolene suture.  Flow was  measured through the graft and was excellent.  A dose of cardioplegia was  given down both vein grafts.   The fifth distal anastomosis was performed with second diagonal branch. The  internal diameter was 1.75 mm.  The conduit used was a third segment of  greater saphenous vein.  The anastomosis performed in an end-to-side manner  using continuous 7-0 Prolene suture.  Flow was measured through the graft  and was excellent.   The sixth distal anastomosis was performed in the mid portion of the left  anterior descending coronary artery.  The internal diameter was about 2.5  mm.  The conduit used was left internal mammary artery  graft and this  was  brought through an opening in the left pericardium anterior to the phrenic  nerve.  This was anastomosis to the LAD in end-to-side manner using  continuous 8-0 Prolene suture.  The pedicle was tacked to the epicardium  with 6-0 Prolene sutures.  Then additional doses of cardioplegia were given  throughout the case at about 20 minute intervals down the vein grafts to  maintain myocardial temperature around 10 degrees C.   Then attention was turned to the aortic valve and ascending aortic aneurysm.  The aortic root was transected at a level of sinotubular junction.  Examination of the native aortic valve showed there was a functionally  bicuspid valve with complete fusion of the commissure between the left and  right coronary cusps.  The left and right coronary ostia were identified.  The ostia were then excised from the aortic wall as buttons.  Care was taken  not to allow rotation.  Then the native valve was excised.  The annulus was  sized and a 25 mm sizer passed through the annulus without difficulty.  A 25  mm Homograft was then chosen.  This had serial number [id number].  This  was a 25 mm Homograft with a 7 cm aorta attached to it.  The excess aortic  wall was trimmed.  The Homograft was then prepared.  Then a series of 4-0  Ethibond sutures placed around the aortic annulus. The sutures were then  passed through the Homograft.  The sutures were tied sequentially over a  pericardial strip for reinforcement. The proximal anastomosis was then  reinforced with BioGlue for hemostasis.   Then the left coronary button was anastomosed to the transected left  coronary stump on Homograft.  This was performed in end-to-side manner using  continuous 4-0 Prolene suture.  This anastomosis was reinforced with light  coating of BioGlue for hemostasis.  Then the Homograft aorta was cut to the  appropriate length and was anastomosed to the distal ascending aorta at the innominate artery in  end-to-end manner using continuous 3-0 Prolene suture  with a felt strip to reinforce the anastomosis.  This anastomosis was then  lightly coated with BioGlue for hemostasis.   Then the position of the right coronary button was noted and this  corresponded to the right coronary stump on the Homograft.  The stump was  transected and then the right coronary button was anastomosed to the opening  in end-to-side manner using continuous 4-0 Prolene suture. This anastomosis  was lightly reinforced with BioGlue for  hemostasis.   Then the three proximal vein graft anastomoses were performed to the  Homograft ascending aorta in an end-to-side manner using continuous 6-0  Prolene suture.  The left side of the heart was then deaired and had  placement in Trendelenburg position.  The clamps were removed from the  mammary pedicle.  There was rapid warming of the ventricular septum.  Crossclamp was removed with time of 141 minutes.  There was spontaneous  return of ventricular fibrillation. The patient was defibrillated in sinus  rhythm.   The proximal and distal anastomoses appeared hemostatic, alignment of the  grafts were satisfactory.  Graft markers were placed around the proximal  anastomoses.  Two temporary right ventricular and right atrial pacing wires  placed and brought out through the skin.   When the patient had rewarmed to 37 degrees C, he was weaned from  cardiopulmonary bypass on low dose dopamine.  Total bypass time was 263  minutes.  Cardiac function appeared excellent with cardiac output of five  liters per minute.  Transesophageal echocardiogram was performed and the  Homograft aortic valve appeared to be functioning normally without  regurgitation.  There was no mitral regurgitation and good left ventricular  and right ventricular function.  Then Protamine was given and the venous and  left femoral cannulas were removed.  The left common femoral artery was  repaired using  continuous 6-0 Prolene suture.  Hemostasis was achieved.  Three chest tubes were placed with a tube in the posterior pericardium, one  in the left pleural space and one in the anterior mediastinum. The  pericardium was reapproximated over the heart.  The sternum was closed with  #6 stainless steel wires.  Facia was closed with continuous #1 Vicryl  suture.  Subcutaneous tissue was closed with continuous 2-0 Vicryl and the  skin with 3-0 Vicryl subcuticular closure vein harvest sites and left groin  incision closed in a similar manner. Sponge, needle and instrument counts  were correct according tot he scrub nurse.  Dry sterile dressing applied  over the incisions around the chest tubes which were hooked to Pleur-Evac  suction.  The patient remained hemodynamically stable and was transported to  the SICU in guarded but stable condition.                                               Evelene Croon, M.D.    BB/MEDQ  D:  07/29/2004  T:  07/30/2004  Job:  [id number]  cc:   Gerlene Burdock A. Alanda Amass, M.D.  [phone] [address]. [address], [address]  [place]  [place] [address]  Fax: [phone]   Cardiac Cath Lab at [facility]

## 2011-04-15 NOTE — Discharge Summary (Signed)
NAME:  Kenneth Scott, Kenneth Scott                         ACCOUNT NO.:  [id number]   MEDICAL RECORD NO.:  [id number]                   PATIENT TYPE:  INP   LOCATION:  2002                                 FACILITY:  [facility]   PHYSICIAN:  Evelene Croon, M.D.                  DATE OF BIRTH:  Nov 30, 1945   DATE OF ADMISSION:  07/18/2004  DATE OF DISCHARGE:  08/04/2004                                 DISCHARGE SUMMARY   HISTORY OF PRESENT ILLNESS:  This is a 65 year old male with no previous  history of coronary artery disease who was in his usual state of health  until about 7:50 p.m. on the day of admission.  He developed chest pain and  pressure while sitting at church.  The pain radiated to both arms and he  also felt somewhat diaphoretic.  He denied shortness of breath or nausea.  EMS was summoned and he was brought to [facility] where he was started on  intravenous nitroglycerin, heparin, and given a dose of IV beta blockers.  He was felt to require prompt admission for further evaluation and full  cardiology workup.   PAST MEDICAL HISTORY:  1.  Hypertension.  2.  Nephrolithiasis.  3.  History of Cardiolite stress test less than a year ago.  4.  Low back surgery, remote.   MEDICATIONS ON ADMISSION:  1.  Occasional aspirin.  2.  He was on a nerve pill, which he was uncertain of, daily.  3.  A blood pressure pill that he was uncertain of the name, daily.   FAMILY HISTORY:  Please see the history and physical done at the time of  admission.   SOCIAL HISTORY:  Please see the history and physical done at the time of  admission.   REVIEW OF SYSTEMS:  Please see the history and physical done at the time of  admission.   PHYSICAL EXAMINATION:  Please see the history and physical done at the time  of admission.   HOSPITAL COURSE:  The patient was admitted.  Early enzymes were positive for  acute anterior wall myocardial infarction with EKG changes.  He was admitted  to telemetry, started on a  2B3A agent, and planned for prompt cardiac  catheterization which was performed emergently on July 18, 2004.  The  patient was found to have significant three-vessel coronary artery disease.  He also was noted to have a LAD occlusion.  This was successfully opened by  PTCA.  The patient was also noted to have ascending aortic dilatation.  Due  to the severity of his anatomy, a cardiac surgical consultation was  obtained.  This was done by Dr. Evelene Croon who evaluated the patient and  studies and recommended further studies including CT scan and echocardiogram  to further evaluate these abnormal findings.  The patient was found on CT  scan to have a 5.3 cm ascending aorta.  There  was no dissection.  A 2-D  echocardiogram showed reduced ejection fraction in the 40 to 45% range, as  well as mild aortic stenosis and mild aortic insufficiency.  After full  review, it was Dr. Sharee Pimple opinion that the patient would benefit from  coronary revascularization with a Bentall procedure.  The patient underwent  a thorough discussion of all his conduit options.  The procedure was  scheduled after further medical stabilization.  On July 29, 2004, the  patient was taken to the operating room where he underwent the following  procedure:  Coronary artery bypass grafting x6 using the left internal  mammary artery to the left anterior descending coronary artery, with a  sequential saphenous vein graft to the first diagonal branch of the left  anterior descending and the obtuse marginal branch of the left circumflex  coronary artery, saphenous vein graft to the second diagonal branch of the  left anterior descending and a sequential saphenous vein graft to the  posterior descending and posterior lateral branches of the coronary artery.  Resection of ascending aortic aneurysm and replacement of aortic valve using  a 25 mm Homograft valve conduit with reimplantation of the coronary  arteries, endoscopic  vein harvesting from the right leg.  Left femoral  arterial cannulation for cardiopulmonary bypass.  The patient tolerated the  procedure well, was taken to the surgical intensive care unit in stable  condition.   POSTOPERATIVE HOSPITAL COURSE:  The patient has done well.  He had  maintained stable hemodynamics.  He did have some initial vasal dilatation  postoperatively requiring Neo, and he was also on low dose dopamine.  These  were able to be weaned without difficulty.  The patient was weaned from the  ventilator without difficulty.  All routine lines, monitors, and drainage  devices were discontinued in a standard fashion.  The patient had a  postoperative anemia.  This has stabilized.  The patient is neurologically  intact.  The patient has advanced in a routine manner in regards to cardiac  rehabilitation phase 1 modalities.  The patient has had multiple medications  added to his regimen with daily adjustments.  It is felt that he is  medically stable at this time.  He did have an episode of postoperative  atrial fibrillation, but this was also stabilized on current regimen to a  normal sinus rhythm.  The patient's oxygen has been weaned and he maintains  good saturations on room air.  The patient has required a gentle diuresis  and will require some further post discharge.  Overall, the patient's status  is felt to be quite adequate for discharge on August 04, 2004.   DISCHARGE MEDICATIONS:  1.  Coated aspirin 325 mg one daily.  2.  Lopressor 25 mg b.i.d.  3.  Crestor 10 mg daily.  4.  Lasix 40 mg daily for one week.  5.  K-Dur 20 mEq daily for one week.  6.  Protonix 40 mg daily.  7.  Lexapro 5 mg daily.  8.  For pain, Tylox one or two q.4-6h. p.r.n.   INSTRUCTIONS:  The patient received written instructions regarding  medications, activity, diet, wound care, followup.   FOLLOWUP: 1.  [facility] in two weeks.  2.  Dr. Laneta Simmers in three weeks.   The office will call with this appointment.   CONDITION ON DISCHARGE:  Stable and improved.   FINAL DIAGNOSIS:  Acute anterior wall myocardial infarction in the setting  of severe  three-vessel coronary artery disease and ascending aortic  aneurysm, now status post surgical revascularization as described above.   OTHER DIAGNOSES:  1.  Postoperative anemia.  Most recent hemoglobin and hematocrit dated      August 01, 2004, are 9.5 and 27, respectively.  2.  Postoperative paroxysmal atrial fibrillation, resolved.  3.  Hypertension.  4.  Nephrolithiasis.  5.  Previous lower back surgery.  6.  Significant family history of heart disease and cerebrovascular      accident.  7.  Percutaneous transluminal coronary angioplasty of the left anterior      descending coronary artery, emergent this hospitalization.      Rowe Clack, P.A.-C.                    Evelene Croon, M.D.    Sherryll Burger  D:  08/04/2004  T:  08/04/2004  Job:  [id number]   cc:   Nicki Guadalajara, M.D.  [phone] [address]  [place], [place] [address]  Fax: [phone]   Dr. Ardith Dark

## 2011-04-15 NOTE — Discharge Summary (Signed)
NAME:  Kenneth Scott, Kenneth Scott                         ACCOUNT NO.:  [id number]   MEDICAL RECORD NO.:  [id number]                   PATIENT TYPE:  INP   LOCATION:  2002                                 FACILITY:  [facility]   PHYSICIAN:  Evelene Croon, M.D.                  DATE OF BIRTH:  [birth year]   DATE OF ADMISSION:  07/18/2004  DATE OF DISCHARGE:  08/05/2004                                 DISCHARGE SUMMARY   ADDENDUM:  Kenneth Scott was originally scheduled for discharge home on  August 04, 2004.  However, on morning rounds that morning he was noted to  have nausea and vomiting as well as some abdominal distention.  He was  complaining of some constipation and was tachycardic with heart rates over  100.  Because of this, his discharge was held.   On physical exam he had normoactive bowel sounds although his abdomen was  somewhat distended.  He was treated with a Dulcolax suppository and was able  to have a bowel movement.  Also, several medications were discontinued which  were thought to be contributing to his nausea including Lasix, potassium,  Colace, and oxycodone.  He remained stable, and his nausea and vomiting  resolved completely.  By August 05, 2004, he was taking p.o. well.  He was  having normal bowel function.  He was afebrile and all other vital signs  were stable.  He had had no other acute changes, and by the afternoon of  August 05, 2004, was deemed to be ready for a discharge home.   Discharge instructions, medications and followup are unchanged from the  previously dictated discharge summary.      Coral Ceo, P.A.                        Evelene Croon, M.D.    GC/MEDQ  D:  08/05/2004  T:  08/06/2004  Job:  [id number]   cc:   Gerlene Burdock A. Alanda Amass, M.D.  [phone] [address]  [place]  [place] [address]  Fax: [phone]   Colon Flattery, D.O.  [address]  [place]  [place] [address]  Fax: [phone]

## 2011-04-15 NOTE — Cardiovascular Report (Signed)
NAME:  Kenneth Scott, Kenneth Scott                         ACCOUNT NO.:  [id number]   MEDICAL RECORD NO.:  [id number]                   PATIENT TYPE:  INP   LOCATION:  1823                                 FACILITY:  [facility]   PHYSICIAN:  Richard A. Alanda Amass, M.D.          DATE OF BIRTH:  12-May-1946   DATE OF PROCEDURE:  07/18/2004  DATE OF DISCHARGE:                              CARDIAC CATHETERIZATION   PROCEDURE:  1. Retrograde central aortic catheterization.  2. Selective coronary angiography via Judkins technique.  3. Left ventricular angiogram, right anterior oblique/left anterior oblique     projection.  4. Aortic root angiography, left anterior oblique projection.  5. Abdominal aortic angiogram, mid-stream posteroanterior projection.  6. Emergency percutaneous coronary intervention with POBA total occlusion,     proximal-mid large left anterior descending, Aggrastat bolus plus     infusion, preoperative Plavix 300 mg, weight-adjusted heparin.   BRIEF HISTORY:  Mr. Laventure is a 65 year old divorced father of 2 with 2  grandchildren who is a nonsmoker.  He has a prior history of hypertension  and possible hyperlipidemia.  He has been evaluated, approximately 2-3 years  ago, with apparent Cardiolite and treadmill exercise test and possible  Cardiolite a year ago as an outpatient.  However, this was done for  surveillance and he was not having any angina or symptoms to suggest  ischemia at that time, and apparently this was normal.  He takes his blood  pressure pill and an anxiety pill, and he is not aware of the names.  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   While at [employer], at approximately 7:50 p.m., he developed severe  substernal chest pressure radiating to the shoulders and arms bilaterally,  associated with shortness of breath and nausea, but no emesis.  He was  brought to the [facility]  Emergency Room by EMS, given 4 baby aspirin en route.  EKG showed acute anterior wall myocardial infarction with ST elevation, V1  through V6, approximately 3-4 mm, in the mid-anterior leads.  He was  hemodynamically stable and maintaining sinus rhythm.  He was given 5 mg of  Lopressor IV in the ER, along with IV nitroglycerin and 5000 units of  heparin.  When I saw the patient in the emergency room, we recommended  emergency catheterization and possible intervention.  He also agreed to  participate in the APEX trial (pexelizumab drug anti-inflammatory  monoclonal antibody study).  Informed consent was obtained to proceed with  catheterization, possible intervention and participation in the APEX Trial.  Because there was a slight delay in the cath lab with another emergency  going on, we elected to start him on Aggrastat bolus plus infusion in the ER  and he received another 4 baby aspirin.  He had continued ST segment  elevation, but his chest pain had improved  significantly post IV Lopressor,  O2 and IV nitroglycerin to approximately 4-5/10.   DESCRIPTION OF PROCEDURE:  The patient was brought to the second floor CP  lab.  The right groin was prepped, draped in the usual manner, 1% Xylocaine  was used for local anesthetic.  The CRFA was entered with a single anterior  puncture using an 18 thin-walled needle and a 6-French short Diag sidearm  sheath was inserted without difficulty.  It became obvious that the patient  had severe aortoiliac tortuosity and severe dilatation of the aortic root on  J-tipped 0.35-inch wire exchange.  He required a 0.035 exchange wire  throughout the remainder of the procedure for catheter exchanges.  Several  diagnostic catheters were tried, but because of the severe aortic root  dilatation and suggestion of aortic root aneurysmal dilatation, the sheath  was upgraded to initially a 6-French long sheath and then a 7-French long  sheath.  Diagnostic left  coronary angiogram was done with a 7-French JL6  diagnostic catheter.  Right coronary angiogram was done with a 6-French JR4  diagnostic catheter.  An angled pigtail was required over the guidewire to  cross the aortic valve.  LV angiogram was done in the RAO and LAO projection  at 25 mL, 14 mL per seconds, and 20 mL, 12 mL per second, respectively.  Pullback pressure of the CA was difficult to evaluate because of marked  tortuosity and initial difficulty crossing the aortic valve, however, this  was accomplished with the pigtail catheter and the exchange wire.  The  aortic valve, however, did show at least 2+ calcification and there appeared  to be at least 1 to 2+ aortic regurgitation, however, this was difficult to  tell, since the pigtail catheter was close to the aortic cusp.  Aortic root  angiography was done in the LAO projection at 30 mL, 20 mL per second.  The  catheter was pulled down above the level of the renal arteries an abdominal  angiogram was done at 30 mL, 20 mL per second, with visualization through  the iliacs bilaterally.   Patient tolerated the diagnostic procedure well, despite difficult catheter  exchanges and sizing, as outlined above.   FINDINGS:  LV:  142/10; LVEDP 18; A 26 mmHg.   CA:  120/80 mmHg.  There appeared to be a 20-mm gradient on catheter  pullback between the LV and CA with sinus rhythm.   Fluoroscopy showed 2 to 3+ calcification of the main LAD and proximal right  coronary.   Left ventricular angiogram in the RAO projection showed hypo-akinesis from  the mid-anterolateral wall down through the mid-inferior wall with an apical  systolic bulge.  Good contraction of the basilar anterior and inferior wall.  No significant MR was noted.  In the LAO projection, there was hypo-akinesis  from the mid-septum down to the distal inferoposterior wall with good  contraction of the posterior wall.  There was angiographic LVH present, at  least moderately  severe or greater.  There was severe dilatation of the aortic root.  Because of technique, it  was very difficult to estimate, but compared to the LV cavity, it was  severely dilated, tortuous and uncoiled.  This may have approached 6-7 cm,  but will need to be sized quantitatively later.  I could not tell about the  origin of the arch vessels on this study.   The renal arteries were single without significant stenosis.  There was  distal abdominal aorta aneurysmal dilatation of  a mild degree, marked  tortuosity of the iliacs and at least mild aneurysmal dilatation of the  LCIA.  There was no significant common external iliac stenosis, the  hypogastrics were intact and the SFA-profunda junction bifurcations were  intact bilaterally with good proximal SFA flow.   The main left coronary had 30% distal narrowing.   The LAD was a very large vessel, had approximately 50% narrowing in the  proximal third before the first diagonal branch.  The first diagonal had  ostial 80% to 90% stenosis, was moderately large and bifurcated.  There was  a large septal perforator following this.  The second diagonal branch was a  moderately large long vessel that had segmental 90% ostial and proximal  stenosis.  Just opposite this diagonal branch was 60% to 70% LAD stenosis.  Just beyond the diagonal was a large septal perforator, followed by total  occlusion of the LAD with TIMI-0 flow and no visualization of the distal  LAD.   The circumflex was nondominant with irregularity in the proximal third and a  trifurcating marginal that was moderate-sized and small PAVG branch.   The right coronary was a large, highly dominant, tortuous vessel.  There  were 50% tandem lesions in the midportion proximal and distal to an RV  branch, 40% distal.  The PLA trifurcated and was large with irregularities  and the PDA had 70% to 80% segmental narrowing in the ostium and proximal  third.  The patient had continued ST  elevation and chest pain at this point  and it was elected to proceed with attempt at PCI, despite the limitations  related to his severe aortic tortuosity and root dilatation.   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   PERCUTANEOUS CORONARY INTERVENTION:  It was elected to proceed with attempt  at PCI.  The patient was already on Aggrastat, was given weight-adjusted  heparin, monitoring ACTs.  He had been given 300 of Plavix in the ER, but  was not given any further Plavix in the laboratory, since our anticipated  goal was to try to effect recanalization and re-perfusion without stenting,  since he will likely need to be considered for operative intervention,  depending upon his recovery and LV function.  It was quite difficult to  intubate the left main coronary with any available guides, including up to  JL6.  The multipurpose guides would not reach the left coronary ostia as well.  I was finally able to get subselective with a 6-French AL-3 guide  through a 7-French long 40-cm sheath.  With this, I was able to pass a 0.014-  inch ASAHI soft wire under fluoroscopic control up to the lesion beyond the  DX-2.  We were then able to -- with some difficulty -- cross the lesion and  the guidewire was free in the distal LAD.  The lesion was dilated with a  2.0/15 Guidant VOYAGER balloon, a 10-27, and in tandem fashion, 10-28 and 8-  18.  This restored flow to the LAD and the distal LAD was a large vessel.  There was high-grade segmental stenosis.  The balloon was then upgraded to  the 3.0/20 Guidant VOYAGER balloon and tandem inflations were done beyond DX-  2 at 8-37, 8-40 and 8-55.  There was reasonable balloon angioplasty result.  There was localized dissection beyond DX-2, but no flow restriction  and TIMI-  3 flow was reestablished ot the distal LAD.  There was no  thrombus or distal  embolization evident.  The patient became pain-free at this time and there  were no significant arrhythmias.   Despite a suboptimal result, it was felt to be adequate to restore  reperfusion.  The dilatation system was removed because of oozing in the  right groin from sheath manipulation; the sheath was upgraded to a short 8-  [place] sheath.  The final ACT was 271 seconds.  Sheath was secured with #1  silk suture to prevent migration.  Patient was on IV nitroglycerin.  He  received a total of 5000 units of heparin in the lab, monitoring ACTs,  continued Aggrastat infusion, 2 mg of Nubain, 2 mg of Versed and IC  nitroglycerin.   He was able to be transferred to the CCU for postoperative care and was in  stable condition on leaving the laboratory.  Final EKG showed a sinus rhythm  at 64 with significant improvement of ST elevation to approximately 1-2 mm  in V3, 1-2 mm in V4 and otherwise near baseline.  There was, however, loss  of R wave across the precordium.   This is a very difficult situation.  The patient has had a reperfusion time  which was relatively short of 3 hours and 20 minutes.  He has significant  acute LV dysfunction, but good contraction of his residual basilar anterior,  inferior and posterior walls.  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  He will likely require  multivessel CABG for his high-grade disease involving the DX-1, large DX-2,  LAD and distal right, and depending upon further study, may need associated  consideration for AVR and a Bentall-type procedure, if he indeed has  ascending aortic root aneurysm.   CATHETERIZATION DIAGNOSES:  1. Acute anterior wall myocardial infarction.  2. 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  3. Mild aortic stenosis and mild-to-moderate aortic  regurgitation.  4. Total left anterior descending occlusion, large vessel beyond DX-2 with    high-grade three-vessel disease.  5. Successful emergency percutaneous transluminal coronary angioplasty/POBA     balloon dilatation alone with reperfusion time 3 hours and 20 minutes, as     outlined above.  6. Marked aortoiliac tortuosity.  7. Systemic hypertension, normal renal arteries.  8. Probable hyperlipidemia.  9. No evidence of aortic dissection on current angiogram, further studies to     follow.                                               Richard A. Alanda Amass, M.D.    RAW/MEDQ  D:  07/19/2004  T:  07/19/2004  Job:  [id number]   cc:   Colon Flattery, D.O.  [address]  [place]  [place] [address]  Fax: [phone]   Nicki Guadalajara, M.D.  [address]  [place], [place] [address]  Fax: [phone]   [facility]

## 2011-04-15 NOTE — Discharge Summary (Signed)
Kenneth Scott, Kenneth [name]   MEDICAL RECORD NO.:  [id number]          PATIENT TYPE:  INP   LOCATION:  1538                         FACILITY:  [facility]   PHYSICIAN:  Jene Every, M.D.    DATE OF BIRTH:  [birth year]   DATE OF ADMISSION:  03/23/2009  DATE OF DISCHARGE:  03/29/2009                               DISCHARGE SUMMARY   ADMISSION DIAGNOSIS:  1,  Severe low back pain.  1. Coronary artery disease status post coronary artery bypass      grafting.  2. Hypertension.  3. Hyperlipidemia.  4. Occasional exacerbation of gout.  5. History of kidney stones.   DISCHARGE DIAGNOSIS:  1,  Severe low back pain.  1. Coronary artery disease status post coronary artery bypass      grafting.  2. Hypertension.  3. Hyperlipidemia.  4. Occasional exacerbation of gout.  5. History of kidney stones.  6. Status post central decompression secondary to severe stenosis at 4-      5 and 5-1, underlying history and physical.  7. Resolved urinary retention.   HISTORY:  Kenneth Scott is a pleasant 65 year old gentleman who was  admitted by Dr. Venita Lick for severe, debilitating low back pain  with acute exacerbation for the past 2 weeks.  Stat MRI did show a  severe foraminal stenosis.  Dr. Shelle Iron did take over treatment of the  patient.  He noted no improvement with conservative measures.  Therefore, recommended proceeding with a central lumbar decompression.  Due to the patient's medical history, clearance was obtained through  [facility].  Risks and benefits of the surgery were discussed with the  patient.  He did wish to proceed.   CONSULTATIONS:  PT, OT, care management, [facility] Hospitalist Service.   PROCEDURE:  The patient was taken to the OR on April 28, underwent a  lumbar decompression, 3-4, 4-5, 5-1 with microdiskectomy at 5-1.  Surgeon Dr. Jene Every, assistant Dr. Ranee Gosselin, anesthesia  general.  Complications none.   LABORATORY:  Preoperative  CBC showed white cell count of 8.6, hemoglobin  of 14.1, hematocrit 41.0.  These were monitored throughout the hospital  course and remained stable.  At time of discharge, hemoglobin was 12.2,  hematocrit 35.3, platelets were 125.  Coagulation studies done  preoperatively showed PT of 14.3, INR of 1.1, and PTT of 29.  Routine  chemistries done preoperatively showed sodium 144, potassium 3.3 glucose  100, BUN of 25, creatinine 1.25.  These were monitored throughout the  hospital course.  Sodium remained stable, at time of discharge was 141.  Potassium did improve; at time of discharge was 4.3.  Glucoses  fluctuated slightly.  BUN remained stable; at time of discharge 16,  creatinine of 1.16.  Routine liver function tests within normal range.  Hemoglobin A1c was 5.5.  Repeat urinalysis was negative.  Postoperative  Doppler study showed no evidence of DVT in bilateral lower extremities.  Preop EKG showed normal sinus rhythm.  Preoperative chest x-ray:  I do  not see report in the chart.   HOSPITAL COURSE:  The patient was admitted initially  for pain control.  MRI studies did reveal severe stenosis at multiple levels.  Once  clearance was obtained through [facility], the patient was taken to the  OR and underwent the above-stated procedure without difficulty.  He was  then transferred to PACU, to the orthopedic floor for continued  postoperative care.   Postoperative day #1, the patient was doing significantly better.  He  noted significant decrease in lower extremity pain.  Vital signs were  stable, he was afebrile.  Calves were soft, nontender.  Labs were within  normal range.  Hemovac was discontinued.  Due to the previous history of  coronary artery disease, __________recommended starting aspirin.  It  will be 2 days before we can resume his Plavix due to the fact we did  not want him to bleed into the spine.  All medical issues were managed  by [facility].  Postoperative Doppler was  negative for DVT.  The patient  did have some issues with urinary retention but this resolved.  PT/OT  was consulted.  The patient did well with this.  Patient continued to  progress well, continued to note decrease in symptoms.  He was afebrile.  Vital signs were stable.  Labs were stable at this point.  Plavix was  resumed.   Postop day #3, on May 2, it was felt the patient was stable to be  discharged home.   DISCHARGE INSTRUCTIONS:  Patient discharged home with home health,  PT/OT.  He is to follow with Dr. Shelle Iron in approximately 10-14 days for  suture removal and x-ray.  Diet is heart healthy.  He is to change his  dressing daily, keep this area clean and dry.  Activity is to walk as  tolerated, using proper back precautions.   DISCHARGE MEDICATIONS:  Patient can resume all home medications,  including Plavix and aspirin, Percocet for pain, Robaxin for spasm.   CONDITION ON DISCHARGE:  Stable.   FINAL DIAGNOSIS:  Doing well, status post central decompression.      Roma Schanz, P.A.      Jene Every, M.D.  Electronically Signed    CS/MEDQ  D:  05/01/2009  T:  05/01/2009  Job:  [id number]

## 2012-09-06 DIAGNOSIS — I10 Essential (primary) hypertension: Secondary | ICD-10-CM

## 2012-09-06 HISTORY — DX: Essential (primary) hypertension: I10

## 2013-02-18 ENCOUNTER — Other Ambulatory Visit (HOSPITAL_COMMUNITY): Payer: Self-pay | Admitting: Cardiovascular Disease

## 2013-02-18 DIAGNOSIS — I714 Abdominal aortic aneurysm, without rupture: Secondary | ICD-10-CM

## 2013-03-04 ENCOUNTER — Encounter (HOSPITAL_COMMUNITY): Payer: Medicare Other

## 2013-03-04 ENCOUNTER — Ambulatory Visit (HOSPITAL_COMMUNITY)
Admission: RE | Admit: 2013-03-04 | Discharge: 2013-03-04 | Disposition: A | Discharge status: Home / Self Care | Payer: Medicare Other | Source: Ambulatory Visit | Attending: Cardiovascular Disease | Admitting: Cardiovascular Disease

## 2013-03-04 DIAGNOSIS — I714 Abdominal aortic aneurysm, without rupture, unspecified: Secondary | ICD-10-CM | POA: Insufficient documentation

## 2013-03-04 HISTORY — DX: Abdominal aortic aneurysm, without rupture: I71.4

## 2013-03-04 HISTORY — DX: Abdominal aortic aneurysm, without rupture, unspecified: I71.40

## 2013-03-05 NOTE — Progress Notes (Signed)
Aorta Duplex Completed. Kenneth Scott D  

## 2013-03-21 ENCOUNTER — Other Ambulatory Visit (HOSPITAL_COMMUNITY): Payer: Self-pay | Admitting: Cardiovascular Disease

## 2013-03-21 DIAGNOSIS — I2510 Atherosclerotic heart disease of native coronary artery without angina pectoris: Secondary | ICD-10-CM

## 2013-03-21 DIAGNOSIS — I252 Old myocardial infarction: Secondary | ICD-10-CM

## 2013-04-10 ENCOUNTER — Encounter (HOSPITAL_COMMUNITY): Payer: Medicare Other

## 2013-04-17 ENCOUNTER — Telehealth: Payer: Self-pay | Admitting: Cardiovascular Disease

## 2013-04-17 ENCOUNTER — Encounter (HOSPITAL_COMMUNITY): Payer: Medicare Other

## 2013-04-17 NOTE — Telephone Encounter (Signed)
Patient will call me back to schedule .Marland Kitchen Left message with his wife

## 2013-04-23 ENCOUNTER — Ambulatory Visit (HOSPITAL_COMMUNITY)
Admission: RE | Admit: 2013-04-23 | Discharge: 2013-04-23 | Disposition: A | Discharge status: Home / Self Care | Payer: Medicare Other | Source: Ambulatory Visit | Attending: Cardiovascular Disease | Admitting: Cardiovascular Disease

## 2013-04-23 DIAGNOSIS — I2510 Atherosclerotic heart disease of native coronary artery without angina pectoris: Secondary | ICD-10-CM

## 2013-04-23 DIAGNOSIS — Z951 Presence of aortocoronary bypass graft: Secondary | ICD-10-CM | POA: Insufficient documentation

## 2013-04-23 DIAGNOSIS — R0609 Other forms of dyspnea: Secondary | ICD-10-CM | POA: Insufficient documentation

## 2013-04-23 DIAGNOSIS — R0989 Other specified symptoms and signs involving the circulatory and respiratory systems: Secondary | ICD-10-CM | POA: Insufficient documentation

## 2013-04-23 DIAGNOSIS — I252 Old myocardial infarction: Secondary | ICD-10-CM | POA: Insufficient documentation

## 2013-04-23 HISTORY — DX: Atherosclerotic heart disease of native coronary artery without angina pectoris: I25.10

## 2013-04-23 MED ORDER — TECHNETIUM TC 99M SESTAMIBI GENERIC - CARDIOLITE
30.0000 | Freq: Once | INTRAVENOUS | Status: AC | PRN
  Administered 2013-04-23: 30 via INTRAVENOUS

## 2013-04-23 MED ORDER — TECHNETIUM TC 99M SESTAMIBI GENERIC - CARDIOLITE
10.0000 | Freq: Once | INTRAVENOUS | Status: AC | PRN
  Administered 2013-04-23: 10 via INTRAVENOUS

## 2013-04-23 NOTE — Procedures (Addendum)
[place] [facility] [address] [place] [address] [phone]  Cardiology Nuclear Med Study  Kenneth Scott is a 67 y.o. male     MRN : [id number]     DOB: 06-17-1946  Procedure Date: 04/23/2013  Nuclear Med Background Indication for Stress Test:  Graft Patency and PTCA Patency History:  CAD;MI-2005;CABG X6-07/29/2004;PTCA-07/18/2004;AAA;DVT;MAS Cardiac Risk Factors: Family History - CAD, Hypertension, Lipids, Overweight and TIA  Symptoms:  DOE   Nuclear Pre-Procedure Caffeine/Decaff Intake:  8:00pm NPO After: 6:00am   IV Site: R Antecubital  IV 0.9% NS with Angio Cath:  22g  Chest Size (in):  44"  IV Started by: Emmit Pomfret, RN  Height: 5\' 11"  (1.803 m)  Cup Size: n/a  BMI:  Body mass index is 28.74 kg/(m^2). Weight:  206 lb (93.441 kg)   Tech Comments:  N/A    Nuclear Med Study 1 or 2 day study: 1 day  Stress Test Type:  Stress  Order Authorizing Provider:  Nicki Guadalajara, MD   Resting Radionuclide: Technetium 77m Sestamibi  Resting Radionuclide Dose: 11.0 mCi   Stress Radionuclide:  Technetium 90m Sestamibi  Stress Radionuclide Dose: 30.9 mCi           Stress Protocol Rest HR: 48 Stress HR: 153  Rest BP: 139/82 Stress BP: 161/80  Exercise Time (min): 8:30 METS: 10.1   Predicted Max HR: 152 bpm % Max HR: 100.66 bpm Rate Pressure Product: 64403  Dose of Adenosine (mg):  n/a Dose of Lexiscan: n/a mg  Dose of Atropine (mg): n/a Dose of Dobutamine: n/a mcg/kg/min (at max HR)  Stress Test Technologist: Esperanza Sheets, CCT Nuclear Technologist: Koren Shiver, CNMT   Rest Procedure:  Myocardial perfusion imaging was performed at rest 45 minutes following the intravenous administration of Technetium 54m Sestamibi. Stress Procedure:  The patient performed treadmill exercise using a Bruce  Protocol for 8:30 minutes. The patient stopped due to Fatigue and SOB and denied any chest pain.  There were significant ST-T  wave changes.  Technetium 56m Sestamibi was injected at peak exercise and myocardial perfusion imaging was performed after a brief delay.  Transient Ischemic Dilatation (Normal <1.22):  0.83 Lung/Heart Ratio (Normal <0.45):  0.29 QGS EDV:  92 ml QGS ESV:  44 ml LV Ejection Fraction: 52%  Signed by Koren Shiver, CNMT  PHYSICIAN INTERPRETATION  Rest ECG: NSR - Normal EKG  Stress ECG: Significant ST abnormalities consistent with ischemia. ~1-2 MM ST depressions noted in II, III, aVF as well as V5 & V6  QPS Raw Data Images:  Patient motion noted. There is interference from nuclear activity from structures below the diaphragm. This does not affect the ability to read the study.  Stress Images:  There is decreased uptake in the antero- and infero-apex.  There is decreased uptake in the mid to apical anteroseptum. There is decreased uptake in the inferolateral  wall. There are 2 perfusion defects: a small to medium sized moderate intensity, mostly fixed defect in the mid to apical anteroseptal wall.  There is also a medium sized, mild to moderate intensity perfusion defect in the inferior-inferolateral wall.  There is partial reversibility in this area. Rest Images:  There is decreased uptake in the anteroapical septum.   There is decreased uptake in the inferior-inferolateral wall.    Subtraction (SDS):  There is a fixed defect that is most consistent with a previous infarction involving the anteroapical septum. There is a fixed defect that is most  consistent with a previous infarction in the inferior-inferolateral wall with partial reversibility suggesting peri-infarct ischemia.  Impression Exercise Capacity:  Good exercise capacity. BP Response:  Normal blood pressure response. Clinical Symptoms:  There is dyspnea. ECG Impression:  Significant ST abnormalities consistent with ischemia. LV Wall Motion:  Low normal LV function with regional wall motion abnormalities: apical septal hypokinesis,  inferior-inferolateral hypokinesis.  Comparison with Prior Nuclear Study: No images to compare.  Overall Impression:  High risk stress nuclear study with two separtate perfusion defects and a suggestion of reversibility in the inferior-inferolateral wall.. and Unfortunately no prior images to compare, however angiographically the apical defect may well be the result of known prior anterior STEMI, however the inferior-inferolateral defect would be more concerning for disease of the SVG-rPDA-PLA. Clinical correlation (and comparison with prior images is warranted).   Marykay Lex, MD  04/23/2013 6:46 PM

## 2013-05-21 ENCOUNTER — Ambulatory Visit (INDEPENDENT_AMBULATORY_CARE_PROVIDER_SITE_OTHER): Payer: Medicare Other | Admitting: Cardiovascular Disease

## 2013-05-21 ENCOUNTER — Encounter: Payer: Self-pay | Admitting: Cardiovascular Disease

## 2013-05-21 VITALS — BP 122/80 | HR 50 | Ht 71.0 in | Wt 211.6 lb

## 2013-05-21 DIAGNOSIS — I7121 Aneurysm of the ascending aorta, without rupture: Secondary | ICD-10-CM

## 2013-05-21 DIAGNOSIS — I719 Aortic aneurysm of unspecified site, without rupture: Secondary | ICD-10-CM

## 2013-05-21 DIAGNOSIS — Z954 Presence of other heart-valve replacement: Secondary | ICD-10-CM

## 2013-05-21 DIAGNOSIS — I10 Essential (primary) hypertension: Secondary | ICD-10-CM

## 2013-05-21 DIAGNOSIS — Z952 Presence of prosthetic heart valve: Secondary | ICD-10-CM

## 2013-05-21 DIAGNOSIS — G459 Transient cerebral ischemic attack, unspecified: Secondary | ICD-10-CM

## 2013-05-21 DIAGNOSIS — M109 Gout, unspecified: Secondary | ICD-10-CM

## 2013-05-21 DIAGNOSIS — R5381 Other malaise: Secondary | ICD-10-CM

## 2013-05-21 DIAGNOSIS — I2699 Other pulmonary embolism without acute cor pulmonale: Secondary | ICD-10-CM

## 2013-05-21 DIAGNOSIS — E782 Mixed hyperlipidemia: Secondary | ICD-10-CM

## 2013-05-21 DIAGNOSIS — I2510 Atherosclerotic heart disease of native coronary artery without angina pectoris: Secondary | ICD-10-CM

## 2013-05-21 LAB — COMPREHENSIVE METABOLIC PANEL
Albumin: 4.2 g/dL (ref 3.5–5.2)
BUN: 21 mg/dL (ref 6–23)
CO2: 30 mEq/L (ref 19–32)
Calcium: 9.5 mg/dL (ref 8.4–10.5)
Chloride: 105 mEq/L (ref 96–112)
Glucose, Bld: 92 mg/dL (ref 70–99)
Potassium: 4.2 mEq/L (ref 3.5–5.3)
Sodium: 145 mEq/L (ref 135–145)
Total Protein: 7 g/dL (ref 6.0–8.3)

## 2013-05-21 LAB — LIPID PANEL
Cholesterol: 125 mg/dL (ref 0–200)
HDL: 32 mg/dL — ABNORMAL LOW (ref >39)
Total CHOL/HDL Ratio: 3.9 Ratio
Triglycerides: 186 mg/dL — ABNORMAL HIGH (ref <150)
VLDL: 37 mg/dL (ref 0–40)

## 2013-05-21 LAB — CBC
Hemoglobin: 14.4 g/dL (ref 13.0–17.0)
MCH: 32.4 pg (ref 26.0–34.0)
Platelets: 183 10*3/uL (ref 150–400)
RBC: 4.45 MIL/uL (ref 4.22–5.81)
WBC: 7 10*3/uL (ref 4.0–10.5)

## 2013-05-21 NOTE — Patient Instructions (Signed)
Your physician recommends that you schedule a follow-up appointment in: 6 months  

## 2013-05-30 ENCOUNTER — Encounter: Payer: Self-pay | Admitting: Cardiovascular Disease

## 2013-05-30 DIAGNOSIS — I7121 Aneurysm of the ascending aorta, without rupture: Secondary | ICD-10-CM | POA: Insufficient documentation

## 2013-05-30 DIAGNOSIS — Z952 Presence of prosthetic heart valve: Secondary | ICD-10-CM | POA: Insufficient documentation

## 2013-05-30 DIAGNOSIS — I82409 Acute embolism and thrombosis of unspecified deep veins of unspecified lower extremity: Secondary | ICD-10-CM | POA: Insufficient documentation

## 2013-05-30 DIAGNOSIS — I2699 Other pulmonary embolism without acute cor pulmonale: Secondary | ICD-10-CM | POA: Insufficient documentation

## 2013-05-30 DIAGNOSIS — G459 Transient cerebral ischemic attack, unspecified: Secondary | ICD-10-CM | POA: Insufficient documentation

## 2013-05-30 DIAGNOSIS — I2510 Atherosclerotic heart disease of native coronary artery without angina pectoris: Secondary | ICD-10-CM | POA: Insufficient documentation

## 2013-05-30 DIAGNOSIS — E782 Mixed hyperlipidemia: Secondary | ICD-10-CM | POA: Insufficient documentation

## 2013-05-30 DIAGNOSIS — M109 Gout, unspecified: Secondary | ICD-10-CM | POA: Insufficient documentation

## 2013-05-30 DIAGNOSIS — I10 Essential (primary) hypertension: Secondary | ICD-10-CM | POA: Insufficient documentation

## 2013-05-30 DIAGNOSIS — I719 Aortic aneurysm of unspecified site, without rupture: Secondary | ICD-10-CM | POA: Insufficient documentation

## 2013-05-30 NOTE — Progress Notes (Signed)
Patient ID: Kenneth Scott, male   DOB: 22-May-1946, 67 y.o.   MRN: [id number]     HPI: Kenneth Scott, is a 67 y.o. male who presents for seven-month followup cardiology evaluation.  In August 2005, Kenneth Scott suffered an anterior wall myocardial infarction and underwent acute PTCA of his LAD for myocardial salvage. Due to significant multivessel coronary artery disease his LAD was not stented and he underwent elective CABG surgery on 07/29/2004 by Dr. Lavinia Scott and had a LIMA placed to the LAD, vein to the diagonal, vein to the marginal, vein to the second diagonal, and vein to the PDA and PLA vessels. Because of significant aneurysmal dilatation of the descending aorta he also underwent aortic valve resection with aortic valve replacement using a 25 mm homograft and reimplantation of his coronary arteries. Additional problems include a history of TIA, hypertension, mixed hyperlipidemia, a history of DVT with subsequent pulmonary embolism/infarction in 2010 she is on chronic Coumadin anticoagulation, as well as a history of gout.  In March 2014 laboratory done by his primary physician continued to show an atherogenic dyslipidemia pattern with triglycerides of 307 VLDL 61 despite an LDL cholesterol of 57.   In April 2014, and aortic ultrasound demonstrated a 3.5x3.4 cm distal abdominal aortic aneurysm just proximal to the iliac bifurcation this had not changed from prior study of June 2013. On 04/23/2013, a nuclear perfusion study revealed abnormality involving the apex which is fixed and most consistent with his previous infarction involving the antral apical septum. There also is a suggestion of mild reversibility in the inferior inferolateral and lateral these have not been significantly changed from his prior studies. Kenneth Scott denies recent chest pain. He denies palpitations. He denies significant shortness of breath the   Past Medical History  Diagnosis Date  . Hyperlipidemia   . Hypertension  09/06/12    ECHO-EF 45%   . Coronary artery disease 04/23/13    NUC STRESS TEST-EF 52% High risk scan two separate perfusion defects and a suggestion of reveribility in the inferior-inferolateral wall.  Marland Kitchen AAA (abdominal aortic aneurysm) 03/04/13    aortic duplex dopler- when compared tp previous study ther is no change. aneurysmal dilation measuring 3.5 x 3.4 cemtimeters    Past Surgical History  Procedure Laterality Date  . Coronary artery bypass graft      No Known Allergies  Current Outpatient Prescriptions  Medication Sig Dispense Refill  . aspirin 81 MG tablet Take 81 mg by mouth daily.      . colchicine 0.6 MG tablet Take 0.6 mg by mouth daily. Takes PRN      . escitalopram (LEXAPRO) 10 MG tablet Take 10 mg by mouth daily. Takes 1/2 tablet daily      . irbesartan-hydrochlorothiazide (AVALIDE) 150-12.5 MG per tablet Take 1 tablet by mouth daily.      . metoprolol succinate (TOPROL-XL) 25 MG 24 hr tablet Take 25 mg by mouth daily. Take 1 tablet AM and 1/2 tablet PM      . omega-3 acid ethyl esters (LOVAZA) 1 G capsule Take 1 g by mouth daily. 2 tablets      . rosuvastatin (CRESTOR) 5 MG tablet Take 5 mg by mouth daily.      Marland Kitchen warfarin (COUMADIN) 5 MG tablet Take 5 mg by mouth daily. As directed per INR      . predniSONE (DELTASONE) 10 MG tablet        No current facility-administered medications for this visit.    Socially  he is married for 34 years. He has 2 children 2 grandchildren. He completed sixth grade. He is self employed.  ROS is negative for fevers, chills or night sweats. He denies palpitations. He denies chest pain. He denies abdominal pain. Denies change in GI or GU symptoms. Denies bleeding. He denies rash. He denies myalgias. He denies any recent gout flares.   Other system review is negative.  PE BP 122/80  Pulse 50  Ht 5\' 11"  (1.803 m)  Wt 211 lb 9.6 oz (95.981 kg)  BMI 29.53 kg/m2  General: Alert, oriented, no distress.  Skin: normal turgor, no  rashes HEENT: Normocephalic, atraumatic. Pupils round and reactive; sclera anicteric;no lid lag.  Nose without nasal septal hypertrophy Mouth/Parynx benign; Mallinpatti scale 3 Neck: No JVD, no carotid briuts Lungs: clear to ausculatation and percussion; no wheezing or rales Heart: RRR, s1 s2 normal 2/6 systolic murmur; no AR. Abdomen: soft, nontender; no hepatosplenomehaly, BS+; abdominal aorta nontender and not dilated by palpation. Pulses 2+ Extremities: no clubbing cyanosis or edema, Homan's sign negative  Neurologic: grossly nonfocal   LABS:  BMET    Component Value Date/Time   NA 145 05/21/2013 0940   K 4.2 05/21/2013 0940   CL 105 05/21/2013 0940   CO2 30 05/21/2013 0940   GLUCOSE 92 05/21/2013 0940   BUN 21 05/21/2013 0940   CREATININE 1.34 05/21/2013 0940   CREATININE 1.15 04/21/2009 1250   CALCIUM 9.5 05/21/2013 0940   GFRNONAA >60 04/21/2009 1250   GFRAA  Value: >60        The eGFR has been calculated using the MDRD equation. This calculation has not been validated in all clinical situations. eGFR's persistently <60 mL/min signify possible Chronic Kidney Disease. 04/21/2009 1250     Hepatic Function Panel     Component Value Date/Time   PROT 7.0 05/21/2013 0940   ALBUMIN 4.2 05/21/2013 0940   AST 25 05/21/2013 0940   ALT 26 05/21/2013 0940   ALKPHOS 90 05/21/2013 0940   BILITOT 0.6 05/21/2013 0940     CBC    Component Value Date/Time   WBC 7.0 05/21/2013 0940   RBC 4.45 05/21/2013 0940   HGB 14.4 05/21/2013 0940   HCT 41.8 05/21/2013 0940   PLT 183 05/21/2013 0940   MCV 93.9 05/21/2013 0940   MCH 32.4 05/21/2013 0940   MCHC 34.4 05/21/2013 0940   RDW 13.9 05/21/2013 0940   LYMPHSABS 1.4 04/21/2009 1250   MONOABS 1.1* 04/21/2009 1250   EOSABS 0.2 04/21/2009 1250   BASOSABS 0.0 04/21/2009 1250     BNP No results found for this basename: probnp    Lipid Panel     Component Value Date/Time   CHOL 125 05/21/2013 0940   TRIG 186* 05/21/2013 0940   HDL 32* 05/21/2013 0940    CHOLHDL 3.9 05/21/2013 0940   VLDL 37 05/21/2013 0940   LDLCALC 56 05/21/2013 0940     RADIOLOGY: No results found.    ASSESSMENT AND PLAN: Kenneth Scott is now 9 years status post his anterior wall myocardial infarction subsequent PTCA of his LAD. He did derive significant myocardial salvage ultimately underwent elective CT revascularization surgery for severe multivessel CAD. At that time, due to the significant descending aortic aneurysmal dilatation he required a homograph aortic valve replacement and reimplantation of his coronary arteries. He has a documented moderate abdominal aortic aneurysm which is unchanged from previously. His recent nuclear perfusion study shows 2 areas of abnormality not significantly changed from previously. He remains  fairly symptom-free on his current medical therapy. I am recommending repeat laboratory V. tach consisting of a CBC, comprehensive metabolic panel, and NMR lipocience profile, and TSH level. Adjustments will be made accordingly to his medical therapy if necessary. I will see him in 6 months for cardiology reevaluation.      Lennette Bihari, MD, [facility]  05/30/2013 1:09 PM

## 2013-06-26 ENCOUNTER — Encounter: Payer: Self-pay | Admitting: *Deleted

## 2013-06-26 NOTE — Progress Notes (Signed)
Quick Note:  Note sent to patient ______ 

## 2013-07-25 ENCOUNTER — Other Ambulatory Visit: Payer: Self-pay | Admitting: Cardiovascular Disease

## 2013-07-25 NOTE — Telephone Encounter (Signed)
Rx was sent to pharmacy electronically. 

## 2013-09-04 ENCOUNTER — Encounter: Payer: Self-pay | Admitting: Cardiovascular Disease

## 2013-09-04 ENCOUNTER — Ambulatory Visit (INDEPENDENT_AMBULATORY_CARE_PROVIDER_SITE_OTHER): Payer: Medicare Other | Admitting: Cardiovascular Disease

## 2013-09-04 VITALS — BP 140/86 | HR 58 | Ht 71.0 in | Wt 198.7 lb

## 2013-09-04 DIAGNOSIS — E782 Mixed hyperlipidemia: Secondary | ICD-10-CM

## 2013-09-04 DIAGNOSIS — I359 Nonrheumatic aortic valve disorder, unspecified: Secondary | ICD-10-CM

## 2013-09-04 DIAGNOSIS — Z952 Presence of prosthetic heart valve: Secondary | ICD-10-CM

## 2013-09-04 DIAGNOSIS — Z79899 Other long term (current) drug therapy: Secondary | ICD-10-CM

## 2013-09-04 DIAGNOSIS — I719 Aortic aneurysm of unspecified site, without rupture: Secondary | ICD-10-CM

## 2013-09-04 DIAGNOSIS — G459 Transient cerebral ischemic attack, unspecified: Secondary | ICD-10-CM

## 2013-09-04 DIAGNOSIS — I7121 Aneurysm of the ascending aorta, without rupture: Secondary | ICD-10-CM

## 2013-09-04 DIAGNOSIS — Z954 Presence of other heart-valve replacement: Secondary | ICD-10-CM

## 2013-09-04 DIAGNOSIS — R0902 Hypoxemia: Secondary | ICD-10-CM

## 2013-09-04 DIAGNOSIS — G479 Sleep disorder, unspecified: Secondary | ICD-10-CM

## 2013-09-04 DIAGNOSIS — M109 Gout, unspecified: Secondary | ICD-10-CM

## 2013-09-04 DIAGNOSIS — I2510 Atherosclerotic heart disease of native coronary artery without angina pectoris: Secondary | ICD-10-CM

## 2013-09-04 NOTE — Patient Instructions (Addendum)
Your physician has requested that you have an echocardiogram. Echocardiography is a painless test that uses sound waves to create images of your heart. It provides your doctor with information about the size and shape of your heart and how well your heart's chambers and valves are working. This procedure takes approximately one hour. There are no restrictions for this procedure.  Your physician has recommended that you have a sleep study. This test records several body functions during sleep, including: brain activity, eye movement, oxygen and carbon dioxide blood levels, heart rate and rhythm, breathing rate and rhythm, the flow of air through your mouth and nose, snoring, body muscle movements, and chest and belly movement.  Your physician recommends that you return for lab work in: prior to next  Visit.   Your physician recommends that you schedule a follow-up appointment in: 6 weeks.

## 2013-09-05 LAB — URINALYSIS
Bilirubin Urine: NEGATIVE
Glucose, UA: NEGATIVE mg/dL
Ketones, ur: NEGATIVE mg/dL
Specific Gravity, Urine: 1.021 (ref 1.005–1.030)
Urobilinogen, UA: 0.2 mg/dL (ref 0.0–1.0)
pH: 5.5 (ref 5.0–8.0)

## 2013-09-05 LAB — COMPREHENSIVE METABOLIC PANEL
AST: 32 U/L (ref 0–37)
Alkaline Phosphatase: 103 U/L (ref 39–117)
BUN: 27 mg/dL — ABNORMAL HIGH (ref 6–23)
Glucose, Bld: 77 mg/dL (ref 70–99)
Total Bilirubin: 0.9 mg/dL (ref 0.3–1.2)

## 2013-09-06 ENCOUNTER — Encounter: Payer: Self-pay | Admitting: Cardiovascular Disease

## 2013-09-06 DIAGNOSIS — R0902 Hypoxemia: Secondary | ICD-10-CM | POA: Insufficient documentation

## 2013-09-06 NOTE — Progress Notes (Addendum)
Patient ID: Kenneth Scott, male   DOB: 1946-03-03, 67 y.o.   MRN: [id number]       HPI: Kenneth Scott, is a 67 y.o. male who presents for followup cardiology evaluation. Apparently, he was recently hospitalized at [facility] in [place], [place].  Kenneth Scott has known CAD and in August 2005 suffered an anterior wall myocardial infarction and underwent acute PTCA of his LAD for myocardial salvage. Due to significant multivessel coronary artery disease his LAD was not stented and he underwent elective CABG surgery on 07/29/2004 by Dr. Laneta Simmers and had a LIMA placed to the LAD, vein to the diagonal, vein to the marginal, vein to the second diagonal, and vein to the PDA and PLA vessels. Because of significant aneurysmal dilatation of the ascending aorta he also underwent aortic valve resection with aortic valve replacement using a 25 mm homograft and reimplantation of his coronary arteries. Additional problems include a history of TIA, hypertension, mixed hyperlipidemia, a history of DVT with subsequent pulmonary embolism/infarction in 2010.  He is on chronic Coumadin anticoagulation, and has a history of gout.  In March 2014 laboratory done by his primary physician continued to show an atherogenic dyslipidemia pattern with triglycerides of 307 VLDL 61 despite an LDL cholesterol of 57.   In April 2014, and aortic ultrasound demonstrated a 3.5x3.4 cm distal abdominal aortic aneurysm just proximal to the iliac bifurcation this had not changed from prior study of June 2013. On 04/23/2013, a nuclear perfusion study revealed abnormality involving the apex which is fixed and most consistent with his previous infarction involving the antral apical septum. There also is a suggestion of mild reversibility in the inferior inferolateral and lateral these have not been significantly changed from his prior studies.  Kenneth Scott, he was recently admitted on 08/30/2013 in discharge 09/01/2013 to [facility]  when he presented with acute gout. While in the emergency room he was noted to have a low PO2 of approximately 50 and was admitted for observation. He was treated with colchicine and was given a dose of steroids in the emergency room. His hypoxia did improve. However, at night he was noted to have significant nocturnal hypoxia and was placed on 2 L nasal cannula his cardiac enzymes were negative. While hospitalized he did have venous duplex imaging which was negative for bilateral lower extremity DVT. He was advised to have an echo Doppler study. He presents now for evaluation.  He denies chest pain. He does admit to fatigue. He does note significant excessive daytime sleepiness. Does snore. While hospitalized he apparently was taken off Avalide.   Past Medical History  Diagnosis Date  . Hyperlipidemia   . Hypertension 09/06/12    ECHO-EF 45%   . Coronary artery disease 04/23/13    NUC STRESS TEST-EF 52% High risk scan two separate perfusion defects and a suggestion of reveribility in the inferior-inferolateral wall.  Marland Kitchen AAA (abdominal aortic aneurysm) 03/04/13    aortic duplex dopler- when compared tp previous study ther is no change. aneurysmal dilation measuring 3.5 x 3.4 cemtimeters    Past Surgical History  Procedure Laterality Date  . Coronary artery bypass graft      No Known Allergies  Current Outpatient Prescriptions  Medication Sig Dispense Refill  . aspirin 81 MG tablet Take 81 mg by mouth daily.      . colchicine 0.6 MG tablet Take 0.6 mg by mouth daily. Takes PRN      . escitalopram (LEXAPRO) 10 MG tablet Take 10  mg by mouth daily. Takes 1/2 tablet daily      . metoprolol succinate (TOPROL-XL) 25 MG 24 hr tablet Take 25 mg by mouth daily. Take 1 tablet AM and 1/2 tablet PM      . omega-3 acid ethyl esters (LOVAZA) 1 G capsule TAKE 2 CAPSULES BY MOUTH DAILY  60 capsule  10  . predniSONE (DELTASONE) 10 MG tablet       . rosuvastatin (CRESTOR) 5 MG tablet Take 5 mg by mouth  daily.      Marland Kitchen warfarin (COUMADIN) 5 MG tablet Take 5 mg by mouth daily. As directed per INR       No current facility-administered medications for this visit.    Socially he is married for 34 years. He has 2 children 2 grandchildren. He completed sixth grade. He is self employed.  ROS is negative for fevers, chills or night sweats. He denies palpitations. He denies chest pain. He denies abdominal pain. Denies change in GI or GU symptoms. Denies bleeding. He denies rash. He denies myalgias. He admits to daytime sleepiness. He admits to fatigue. He does snore. He denies hypnagogic hallucinations. He is unaware of any cataplectic spells. He denies claudication.  Other 12 point system review is negative.  PE BP 140/86  Pulse 58  Ht 5\' 11"  (1.803 m)  Wt 198 lb 11.2 oz (90.13 kg)  BMI 27.73 kg/m2  General: Alert, oriented, no distress.  Skin: normal turgor, no rashes HEENT: Normocephalic, atraumatic. Pupils round and reactive; sclera anicteric;no lid lag.  Nose without nasal septal hypertrophy Mouth/Parynx benign; Mallinpatti scale 3 Neck: No JVD, no carotid briuts Lungs: clear to ausculatation and percussion; no wheezing or rales Heart: RRR, s1 s2 normal 2/6 systolic murmur; no AR. Abdomen: soft, nontender; no hepatosplenomehaly, BS+; abdominal aorta nontender and not dilated by palpation. Pulses 2+ Extremities: no clubbing cyanosis or edema, Homan's sign negative  Neurologic: grossly nonfocal  ECG with rhythm strip shows sinus rhythm with PACs and transient atrial trigeminy. LABS:  BMET    Component Value Date/Time   NA 146* 09/04/2013 1309   K 4.3 09/04/2013 1309   CL 105 09/04/2013 1309   CO2 30 09/04/2013 1309   GLUCOSE 77 09/04/2013 1309   BUN 27* 09/04/2013 1309   CREATININE 1.17 09/04/2013 1309   CREATININE 1.15 04/21/2009 1250   CALCIUM 9.5 09/04/2013 1309   GFRNONAA >60 04/21/2009 1250   GFRAA  Value: >60        The eGFR has been calculated using the MDRD equation. This  calculation has not been validated in all clinical situations. eGFR's persistently <60 mL/min signify possible Chronic Kidney Disease. 04/21/2009 1250     Hepatic Function Panel     Component Value Date/Time   PROT 6.9 09/04/2013 1309   ALBUMIN 3.8 09/04/2013 1309   AST 32 09/04/2013 1309   ALT 41 09/04/2013 1309   ALKPHOS 103 09/04/2013 1309   BILITOT 0.9 09/04/2013 1309     CBC    Component Value Date/Time   WBC 7.0 05/21/2013 0940   RBC 4.45 05/21/2013 0940   HGB 14.4 05/21/2013 0940   HCT 41.8 05/21/2013 0940   PLT 183 05/21/2013 0940   MCV 93.9 05/21/2013 0940   MCH 32.4 05/21/2013 0940   MCHC 34.4 05/21/2013 0940   RDW 13.9 05/21/2013 0940   LYMPHSABS 1.4 04/21/2009 1250   MONOABS 1.1* 04/21/2009 1250   EOSABS 0.2 04/21/2009 1250   BASOSABS 0.0 04/21/2009 1250     BNP  No results found for this basename: probnp    Lipid Panel     Component Value Date/Time   CHOL 125 05/21/2013 0940   TRIG 186* 05/21/2013 0940   HDL 32* 05/21/2013 0940   CHOLHDL 3.9 05/21/2013 0940   VLDL 37 05/21/2013 0940   LDLCALC 56 05/21/2013 0940     RADIOLOGY: No results found.    ASSESSMENT AND PLAN: Kenneth Scott is now 9 years status post his anterior wall myocardial infarction subsequent PTCA of his LAD. He did derive significant myocardial salvage ultimately underwent elective CT revascularization surgery for severe multivessel CAD. At that time, due to the significant asscending aortic aneurysmal dilatation he required a homograph aortic valve replacement and reimplantation of his coronary arteries. He has a documented moderate abdominal aortic aneurysm which is unchanged from previously. His recent nuclear perfusion study shows 2 areas of abnormality not significantly changed from previously.  I did review his recent hospital records from [facility]. He was noted to have nocturnal hypoxia. His symptom status does raise the possibility of obstructive sleep apnea. I am scheduling him for a  diagnostic polysomnogram and if positive a CPAP titration trial to further evaluate potential for obstructive sleep apnea. I wll also schedule him for a 2-D echo Doppler study. Laboratory will be obtained. He may require reinitiation of blood pressure medication since his Avalide was discontinued depending upon renal function may be able tolerate ACE or ARB without the diuretic for blood pressure control.   I see him back in the office in 6 weeks for followup evaluation.     Kenneth Bihari, MD, [facility]  09/06/2013 2:28 PM

## 2013-09-10 ENCOUNTER — Encounter: Payer: Self-pay | Admitting: *Deleted

## 2013-09-10 NOTE — Progress Notes (Signed)
Quick Note:  Results with note mailed to patient. ______

## 2013-09-10 NOTE — Progress Notes (Signed)
Quick Note:  Note sent to patient with labs. ______

## 2013-09-12 ENCOUNTER — Encounter: Payer: Self-pay | Admitting: Cardiovascular Disease

## 2013-09-18 ENCOUNTER — Ambulatory Visit (HOSPITAL_COMMUNITY)
Admission: RE | Admit: 2013-09-18 | Discharge: 2013-09-18 | Disposition: A | Discharge status: Home / Self Care | Payer: Medicare Other | Source: Ambulatory Visit | Attending: Cardiovascular Disease | Admitting: Cardiovascular Disease

## 2013-09-18 DIAGNOSIS — I059 Rheumatic mitral valve disease, unspecified: Secondary | ICD-10-CM | POA: Insufficient documentation

## 2013-09-18 DIAGNOSIS — I079 Rheumatic tricuspid valve disease, unspecified: Secondary | ICD-10-CM | POA: Insufficient documentation

## 2013-09-18 DIAGNOSIS — G459 Transient cerebral ischemic attack, unspecified: Secondary | ICD-10-CM | POA: Insufficient documentation

## 2013-09-18 DIAGNOSIS — I719 Aortic aneurysm of unspecified site, without rupture: Secondary | ICD-10-CM | POA: Insufficient documentation

## 2013-09-18 DIAGNOSIS — Z954 Presence of other heart-valve replacement: Secondary | ICD-10-CM | POA: Insufficient documentation

## 2013-09-18 DIAGNOSIS — I379 Nonrheumatic pulmonary valve disorder, unspecified: Secondary | ICD-10-CM | POA: Insufficient documentation

## 2013-09-18 DIAGNOSIS — I359 Nonrheumatic aortic valve disorder, unspecified: Secondary | ICD-10-CM

## 2013-09-18 DIAGNOSIS — G4730 Sleep apnea, unspecified: Secondary | ICD-10-CM | POA: Insufficient documentation

## 2013-09-18 NOTE — Progress Notes (Signed)
2D Echo Performed 09/18/2013    Garhett Bernhard, RCS  

## 2013-10-29 ENCOUNTER — Ambulatory Visit (INDEPENDENT_AMBULATORY_CARE_PROVIDER_SITE_OTHER): Payer: Medicare Other | Admitting: Cardiovascular Disease

## 2013-10-29 ENCOUNTER — Encounter: Payer: Self-pay | Admitting: Cardiovascular Disease

## 2013-10-29 VITALS — BP 118/80 | HR 56 | Ht 71.0 in | Wt 201.0 lb

## 2013-10-29 DIAGNOSIS — R0902 Hypoxemia: Secondary | ICD-10-CM

## 2013-10-29 DIAGNOSIS — Q2543 Congenital aneurysm of aorta: Secondary | ICD-10-CM

## 2013-10-29 DIAGNOSIS — G4733 Obstructive sleep apnea (adult) (pediatric): Secondary | ICD-10-CM

## 2013-10-29 DIAGNOSIS — I10 Essential (primary) hypertension: Secondary | ICD-10-CM

## 2013-10-29 DIAGNOSIS — Z952 Presence of prosthetic heart valve: Secondary | ICD-10-CM

## 2013-10-29 DIAGNOSIS — I719 Aortic aneurysm of unspecified site, without rupture: Secondary | ICD-10-CM

## 2013-10-29 DIAGNOSIS — M109 Gout, unspecified: Secondary | ICD-10-CM

## 2013-10-29 DIAGNOSIS — Z954 Presence of other heart-valve replacement: Secondary | ICD-10-CM

## 2013-10-29 DIAGNOSIS — I7121 Aneurysm of the ascending aorta, without rupture: Secondary | ICD-10-CM

## 2013-10-29 DIAGNOSIS — I2510 Atherosclerotic heart disease of native coronary artery without angina pectoris: Secondary | ICD-10-CM

## 2013-10-29 NOTE — Progress Notes (Signed)
Patient ID: Kenneth Scott, male   DOB: 02-26-1946, 67 y.o.   MRN: [id number]       HPI: Kenneth Scott, is a 67 y.o. male who presents for followup cardiology evaluation. I had recently seen him following a hospitalizaton at [facility] in [place], [place].  Kenneth Scott has known CAD and in August 2005 suffered an anterior wall myocardial infarction and underwent acute PTCA of his LAD for myocardial salvage. Due to significant multivessel coronary artery disease his LAD was not stented and he underwent elective CABG surgery on 07/29/2004 by Dr. Laneta Simmers and had a LIMA placed to the LAD, vein to the diagonal, vein to the marginal, vein to the second diagonal, and vein to the PDA and PLA vessels. Because of significant aneurysmal dilatation of the ascending aorta he also underwent aortic valve resection with aortic valve replacement using a 25 mm homograft and reimplantation of his coronary arteries. Additional problems include a history of TIA, hypertension, mixed hyperlipidemia, a history of DVT with subsequent pulmonary embolism/infarction in 2010.  He is on chronic Coumadin anticoagulation, and has a history of gout.  In March 2014 laboratory done by his primary physician continued to show an atherogenic dyslipidemia pattern with triglycerides of 307 VLDL 61 despite an LDL cholesterol of 57.   In April 2014, and aortic ultrasound demonstrated a 3.5x3.4 cm distal abdominal aortic aneurysm just proximal to the iliac bifurcation this had not changed from prior study of June 2013. On 04/23/2013, a nuclear perfusion study revealed abnormality involving the apex which is fixed and most consistent with his previous infarction involving the antral apical septum. There also is a suggestion of mild reversibility in the inferior inferolateral and lateral these have not been significantly changed from his prior studies.  He was admitted on 08/30/2013  to [facility] when he presented with acute  gout. While in the emergency room he was noted to have a low PO2 of approximately 50 and was admitted for observation. He was treated with colchicine and was given a dose of steroids in the emergency room. His hypoxia did improve. However, at night he was noted to have significant nocturnal hypoxia and was placed on 2 L nasal cannula his cardiac enzymes were negative. While hospitalized he did have venous duplex imaging which was negative for bilateral lower extremity DVT. He was advised to have an echo Doppler study.  I saw him following his hospitalization and was concerned that obstructive sleep apnea  was contributory to his nocturnal oxygen desaturation. He did undergo an echo Doppler study which showed an ejection fraction of 50-55% with moderate hypokinesis. He had mild mitral regurgitation. Pulmonary artery pressure was 31 mm; other valvular structures were normal. He underwent a sleep study and due to severe sleep apnea this was a split night protocol. The baseline portion confirmed severe sleep apnea with an AHI of 41.4 per hour but REM sleep this was 60 per hour. He dropped his oxygen saturation to 78% in non-REM sleep and 73% with REM sleep and had loud snoring. He was titrated with CPAP up to 11 cm with excellent result. He tells me he's been set up with CPAP at home for the past 2 weeks by [facility]. However, he has only been using this half the night and other times when he doesn't use it he uses his oxygen. He also has still been taking some naps during the day and has not been using his CPAP for his naps. He  presents for evaluation.  Of note, I did review his laboratory as noted below.   Past Medical History  Diagnosis Date  . Hyperlipidemia   . Hypertension 09/06/12    ECHO-EF 45%   . Coronary artery disease 04/23/13    NUC STRESS TEST-EF 52% High risk scan two separate perfusion defects and a suggestion of reveribility in the inferior-inferolateral wall.  Marland Kitchen AAA (abdominal aortic  aneurysm) 03/04/13    aortic duplex dopler- when compared tp previous study ther is no change. aneurysmal dilation measuring 3.5 x 3.4 cemtimeters    Past Surgical History  Procedure Laterality Date  . Coronary artery bypass graft      No Known Allergies  Current Outpatient Prescriptions  Medication Sig Dispense Refill  . allopurinol (ZYLOPRIM) 100 MG tablet Take 2 tablets by mouth daily.      Marland Kitchen aspirin 81 MG tablet Take 81 mg by mouth daily.      . colchicine 0.6 MG tablet Take 0.6 mg by mouth daily. Takes PRN      . escitalopram (LEXAPRO) 10 MG tablet Take 10 mg by mouth daily. Takes 1/2 tablet daily      . furosemide (LASIX) 40 MG tablet Take 40 mg by mouth. Patient takes occaisionally      . irbesartan-hydrochlorothiazide (AVALIDE) 150-12.5 MG per tablet Take 1 tablet by mouth as needed.      . metoprolol succinate (TOPROL-XL) 25 MG 24 hr tablet Take 25 mg by mouth 2 (two) times daily.       . Omega-3 Krill Oil 300 MG CAPS Take 1 capsule by mouth daily.      . predniSONE (DELTASONE) 10 MG tablet as needed.       . rosuvastatin (CRESTOR) 5 MG tablet Take 5 mg by mouth daily.      Marland Kitchen warfarin (COUMADIN) 5 MG tablet Take 5 mg by mouth daily. As directed per INR       No current facility-administered medications for this visit.    Socially he is married for 34 years. He has 2 children 2 grandchildren. He completed sixth grade. He is self employed.  ROS is negative for fevers, chills or night sweats. He denies skin rash. He denies visual symptoms. He denies change in hearing. He denies palpitations. He denies chest pain. He denies abdominal pain. Denies change in GI or GU symptoms. Denies bleeding. He did have another is right hand. He denies myalgias. He admits to daytime sleepiness. He admits to fatigue. He does snore. He denies hypnagogic hallucinations. He is unaware of any cataplectic spells. He denies claudication.  Other 12 point system review is negative.  PE BP 118/80  Pulse  56  Ht 5\' 11"  (1.803 m)  Wt 201 lb (91.173 kg)  BMI 28.05 kg/m2  General: Alert, oriented, no distress.  Skin: normal turgor, no rashes HEENT: Normocephalic, atraumatic. Pupils round and reactive; sclera anicteric;no lid lag.  Nose without nasal septal hypertrophy Mouth/Parynx benign; Mallinpatti scale 3 Neck: No JVD, no carotid briuts Lungs: clear to ausculatation and percussion; no wheezing or rales Heart: RRR, s1 s2 normal 2/6 systolic murmur; no AR. Abdomen: soft, nontender; no hepatosplenomehaly, BS+; abdominal aorta nontender and not dilated by palpation. Pulses 2+ Extremities: Trace to 1+ right ankle edema, no clubbing cyanosis , Homan's sign negative  Neurologic: grossly nonfocal   ECG: sinus rhythm with PACs.    LABS:  BMET    Component Value Date/Time   NA 146* 09/04/2013 1309   K 4.3 09/04/2013  1309   CL 105 09/04/2013 1309   CO2 30 09/04/2013 1309   GLUCOSE 77 09/04/2013 1309   BUN 27* 09/04/2013 1309   CREATININE 1.17 09/04/2013 1309   CREATININE 1.15 04/21/2009 1250   CALCIUM 9.5 09/04/2013 1309   GFRNONAA >60 04/21/2009 1250   GFRAA  Value: >60        The eGFR has been calculated using the MDRD equation. This calculation has not been validated in all clinical situations. eGFR's persistently <60 mL/min signify possible Chronic Kidney Disease. 04/21/2009 1250     Hepatic Function Panel     Component Value Date/Time   PROT 6.9 09/04/2013 1309   ALBUMIN 3.8 09/04/2013 1309   AST 32 09/04/2013 1309   ALT 41 09/04/2013 1309   ALKPHOS 103 09/04/2013 1309   BILITOT 0.9 09/04/2013 1309     CBC    Component Value Date/Time   WBC 7.0 05/21/2013 0940   RBC 4.45 05/21/2013 0940   HGB 14.4 05/21/2013 0940   HCT 41.8 05/21/2013 0940   PLT 183 05/21/2013 0940   MCV 93.9 05/21/2013 0940   MCH 32.4 05/21/2013 0940   MCHC 34.4 05/21/2013 0940   RDW 13.9 05/21/2013 0940   LYMPHSABS 1.4 04/21/2009 1250   MONOABS 1.1* 04/21/2009 1250   EOSABS 0.2 04/21/2009 1250   BASOSABS 0.0  04/21/2009 1250     BNP No results found for this basename: probnp    Lipid Panel     Component Value Date/Time   CHOL 125 05/21/2013 0940   TRIG 186* 05/21/2013 0940   HDL 32* 05/21/2013 0940   CHOLHDL 3.9 05/21/2013 0940   VLDL 37 05/21/2013 0940   LDLCALC 56 05/21/2013 0940     RADIOLOGY: No results found.    ASSESSMENT AND PLAN: Kenneth Scott is 9 years status post his anterior wall myocardial infarction subsequent PTCA of his LAD. He did derive significant myocardial salvage ultimately underwent elective CABG revascularization surgery for severe multivessel CAD. At that time, due to the significant asscending aortic aneurysmal dilatation he required a homograph aortic valve replacement and reimplantation of his coronary arteries. He has a documented moderate abdominal aortic aneurysm which is unchanged from previously. His recent nuclear perfusion study shows 2 areas of abnormality not significantly changed from previously.  I did review Kenneth Scott's laboratory with him in detail. His lipid status is excellent on his current dose of rosuvastatin. Echo Doppler study shows an ejection fraction of 50-55%. He does have furosemide to use on an as-needed basis for additional leg swelling.  His pressure is now well-controlled on current therapy. His sleep study confirms severe sleep apnea with significant oxygen desaturation. Palate has been using CPAP now for 2 weeks but apparently has only been using this all night but perhaps only for 4 hours. I discussed with him the importance of using this all night. His sleep apnea is very severe during REM sleep and a preponderance of REM sleep occurs in the second phase of night when he dose need to cystoscope applied scaffolding of his soft tissue with air, pressure and may prevent his need for oxygen. When she is using CPAP regularly, we will obtain an overnight oximetry to assess for oxygen desaturation on in with compliant CPAP use. He still has  nocturnal desaturation we will then need to bleed in supplemental oxygen into his CPAP unit. I will see him back in the office in [facility] in several months. I discussed with him the importance  of meeting Medicare compliance standard but above and beyond he needs to use his CPAP all night and if he does require naps to use CPAP during his nap time.   Kenneth Bihari, MD, [facility]  10/29/2013 8:27 AM

## 2013-10-29 NOTE — Patient Instructions (Signed)
Your physician recommends that you schedule a follow-up appointment in: Next [facility] sleep clinic.

## 2013-10-31 ENCOUNTER — Telehealth: Payer: Self-pay | Admitting: *Deleted

## 2013-10-31 NOTE — Telephone Encounter (Signed)
Faxed supply signed CPAP supply order back.

## 2013-12-01 ENCOUNTER — Other Ambulatory Visit: Payer: Self-pay | Admitting: Cardiovascular Disease

## 2013-12-19 ENCOUNTER — Telehealth: Payer: Self-pay | Admitting: *Deleted

## 2013-12-19 NOTE — Telephone Encounter (Signed)
Returned CPAP supply order. 

## 2013-12-23 ENCOUNTER — Telehealth: Payer: Self-pay | Admitting: Cardiovascular Disease

## 2014-01-08 ENCOUNTER — Ambulatory Visit: Payer: Medicare Other | Admitting: Cardiovascular Disease

## 2014-01-23 ENCOUNTER — Ambulatory Visit: Payer: Medicare Other | Admitting: Cardiovascular Disease

## 2014-02-07 ENCOUNTER — Encounter: Payer: Self-pay | Admitting: Physician Assistant

## 2014-02-24 ENCOUNTER — Ambulatory Visit (INDEPENDENT_AMBULATORY_CARE_PROVIDER_SITE_OTHER): Payer: Medicare Other | Admitting: Physician Assistant

## 2014-02-24 ENCOUNTER — Encounter: Payer: Self-pay | Admitting: Physician Assistant

## 2014-02-24 VITALS — BP 134/70 | HR 76 | Ht 71.0 in | Wt 212.0 lb

## 2014-02-24 DIAGNOSIS — Z1211 Encounter for screening for malignant neoplasm of colon: Secondary | ICD-10-CM

## 2014-02-24 DIAGNOSIS — Z7901 Long term (current) use of anticoagulants: Secondary | ICD-10-CM

## 2014-02-24 NOTE — Patient Instructions (Signed)
As discussed we will proceed with the Cologuard test for the colon cancer screening test .   You will be contacted by [employer] and will receive a package in the mail with instructions.  Their phone number is [phone].  It may take several weeks to get the results but we will contact you once we get them.

## 2014-02-24 NOTE — Progress Notes (Signed)
Subjective:    Patient ID: Kenneth Scott, male    DOB: 02/06/1946, 68 y.o.   MRN: [id number]  HPI  [name] is a pleasant 68 year old white male referred by Dr. Edrick Oh for colon neoplasia screening. Patient has not had any prior GI evaluation or colonoscopy. He does have significant medical history with coronary artery disease status post MI with PTCA of the LAD and then CABG in 2005. He is also had an aortic valve replacement with a tissue valve. He has history of DVT and pulmonary embolism and 2010, severe sleep apnea, and an abdominal aortic aneurysm which is being monitored. He may have had a TIA in 2013. He is maintained on chronic Coumadin. He has no current GI complaints. He says he did see some blood in the commode about 4 months ago and this occurred for for one day and has never recurred. He has no complaints of abdominal pain changes in bowel habits melena or hematochezia. Family history is negative for colon cancer polyps as far as he is aware. He is anxious about possibility of coming off of his blood thinner because he had a brother die  Recently  after switching anticoagulants.    Review of Systems  Constitutional: Negative.   HENT: Negative.   Eyes: Negative.   Respiratory: Negative.   Cardiovascular: Negative.   Gastrointestinal: Negative.   Endocrine: Negative.   Genitourinary: Negative.   Musculoskeletal: Positive for back pain.  Skin: Negative.   Allergic/Immunologic: Negative.   Neurological: Negative.   Hematological: Negative.   Psychiatric/Behavioral: Negative.    Outpatient Prescriptions Prior to Visit  Medication Sig Dispense Refill  . allopurinol (ZYLOPRIM) 100 MG tablet Take 2 tablets by mouth daily.      Marland Kitchen aspirin 81 MG tablet Take 81 mg by mouth daily.      . colchicine 0.6 MG tablet Take 0.6 mg by mouth daily. Takes PRN      . escitalopram (LEXAPRO) 10 MG tablet Take 10 mg by mouth daily. Takes 1/2 tablet daily      . furosemide (LASIX) 20 MG tablet  TAKE 1 TABLET EVERY DAY  30 tablet  5  . irbesartan-hydrochlorothiazide (AVALIDE) 150-12.5 MG per tablet Take 1 tablet by mouth as needed.      . metoprolol succinate (TOPROL-XL) 25 MG 24 hr tablet Take 25 mg by mouth 2 (two) times daily.       . Omega-3 Krill Oil 300 MG CAPS Take 1 capsule by mouth daily.      . predniSONE (DELTASONE) 10 MG tablet as needed.       . warfarin (COUMADIN) 5 MG tablet Take 5 mg by mouth daily. As directed per INR      . rosuvastatin (CRESTOR) 5 MG tablet Take 5 mg by mouth daily.      . furosemide (LASIX) 40 MG tablet Take 40 mg by mouth. Patient takes occaisionally       No facility-administered medications prior to visit.   No Known Allergies Patient Active Problem List   Diagnosis Date Noted  . Severe obstructive sleep apnea 10/29/2013  . Hypoxia 09/06/2013  . CAD in native artery 05/30/2013  . 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 S/P AVR 05/30/2013  . Hypertension 05/30/2013  . Mixed hyperlipidemia 05/30/2013  . DVT (deep venous thrombosis) 05/30/2013  . Pulmonary embolism and infarction 05/30/2013  . TIA (transient ischemic attack) 05/30/2013  . Gout 05/30/2013   History  Substance Use Topics  . Smoking status: Never Smoker   .  Smokeless tobacco: Not on file  . Alcohol Use: No   family history includes Cancer in his sister; Heart disease in his father and mother.     Objective:   Physical Exam  well-developed older white male in no acute distress, accompanied by his wife blood pressure 134/70 pulse 76 height 5 foot 11 weight 212. HEENT; nontraumatic normocephalic EOMI PERRLA sclera anicteric, Supple ;no JVD, Cardiovascular; regular rate and rhythm with S1-S2 sternal incisional scar, Pulmonary; clear bilaterally, Abdomen; soft nontender nondistended bowel sounds are active no palpable mass or hepatosplenomegaly, Rectal; exam not done, Extremities; no clubbing cyanosis or edema skin warm and dry, Psych; mood and affect appropriate          Assessment & Plan:  #40  68 year old male with multiple comorbidities including coronary artery disease status post MI and CABG, aortic valve replacement, hypertension, severe sleep apnea, abdominal aortic aneurysm, and history of DVT and pulmonary embolism with infarction 2010. Patient maintained on chronic Coumadin. He is referred for colon neoplasia surveillance-is asymptomatic and of average risk for colon cancer  Plan; discussed colonoscopy at length with the patient and his wife including the need to stop his anticoagulation and perhaps bridge with Lovenox. We also discussed Colo Guard stool DNA testing as a non-invasive means for screening. I made it clear that this is not as definitive or accurate as colonoscopy but has good sensitivity for detection of colon cancers and adenomas. Patient would like to pursue the Cologuard DNA screening first understanding that if this returns positive he should have a colonoscopy which would be scheduled with Dr. Deatra Ina and likely require Lovenox bridging.

## 2014-02-25 NOTE — Progress Notes (Signed)
Reviewed and agree with management. Robert D. Kaplan, M.D., FACG  

## 2014-03-04 ENCOUNTER — Encounter: Payer: Self-pay | Admitting: Cardiovascular Disease

## 2014-03-04 ENCOUNTER — Ambulatory Visit (INDEPENDENT_AMBULATORY_CARE_PROVIDER_SITE_OTHER): Payer: Medicare Other | Admitting: Cardiovascular Disease

## 2014-03-04 VITALS — BP 127/61 | HR 52 | Ht 71.0 in | Wt 209.6 lb

## 2014-03-04 DIAGNOSIS — Z9989 Dependence on other enabling machines and devices: Secondary | ICD-10-CM

## 2014-03-04 DIAGNOSIS — I10 Essential (primary) hypertension: Secondary | ICD-10-CM

## 2014-03-04 DIAGNOSIS — I7121 Aneurysm of the ascending aorta, without rupture: Secondary | ICD-10-CM

## 2014-03-04 DIAGNOSIS — G4733 Obstructive sleep apnea (adult) (pediatric): Secondary | ICD-10-CM

## 2014-03-04 DIAGNOSIS — Z952 Presence of prosthetic heart valve: Secondary | ICD-10-CM

## 2014-03-04 DIAGNOSIS — I2510 Atherosclerotic heart disease of native coronary artery without angina pectoris: Secondary | ICD-10-CM

## 2014-03-04 DIAGNOSIS — Z954 Presence of other heart-valve replacement: Secondary | ICD-10-CM

## 2014-03-04 DIAGNOSIS — I719 Aortic aneurysm of unspecified site, without rupture: Secondary | ICD-10-CM

## 2014-03-04 DIAGNOSIS — I82409 Acute embolism and thrombosis of unspecified deep veins of unspecified lower extremity: Secondary | ICD-10-CM

## 2014-03-04 NOTE — Patient Instructions (Signed)
Your physician recommends that you schedule a follow-up appointment in a sleep clinic as needed. 6 months for cardiac care.

## 2014-03-05 ENCOUNTER — Telehealth: Payer: Self-pay | Admitting: Cardiovascular Disease

## 2014-03-05 NOTE — Telephone Encounter (Signed)
Pt needs an order to have his oxygen removed from his home please

## 2014-03-05 NOTE — Telephone Encounter (Signed)
Patient has appointment tomorrow. Will discuss at visit.

## 2014-03-05 NOTE — Telephone Encounter (Signed)
Sent to [facility] for review.

## 2014-03-19 NOTE — Telephone Encounter (Signed)
This encounter is closed. 

## 2014-03-21 NOTE — Telephone Encounter (Signed)
Is calling back to get the oxygen tank removed from his home, and [facility] states that they have not received anything and if we could fax the order over to them to remove the tank. The fax number is [phone]

## 2014-03-23 ENCOUNTER — Encounter: Payer: Self-pay | Admitting: Cardiovascular Disease

## 2014-03-23 DIAGNOSIS — Z9989 Dependence on other enabling machines and devices: Secondary | ICD-10-CM

## 2014-03-23 DIAGNOSIS — G4733 Obstructive sleep apnea (adult) (pediatric): Secondary | ICD-10-CM | POA: Insufficient documentation

## 2014-03-23 NOTE — Progress Notes (Signed)
Patient ID: Kenneth Scott, male   DOB: [birth year], 68 y.o.   MRN: [id number]        HPI: Kenneth Scott is a 68 y.o. male who presents for followup cardiology and sleep clinic evaluation.  Kenneth Scott has known CAD and suffered an anterior wall myocardial infarction treated with acute PTCA of his LAD for myocardial salvage in August 2005. Due to significant multivessel coronary artery disease his LAD was not stented and he underwent elective CABG surgery on 07/29/2004 by Dr. Cyndia Bent and had a LIMA placed to the LAD, vein to the diagonal, vein to the marginal, vein to the second diagonal, and vein to the PDA and PLA vessels. Because of significant aneurysmal dilatation of the ascending aorta he also underwent aortic valve resection with aortic valve replacement using a 25 mm homograft and reimplantation of his coronary arteries. Additional problems include a history of TIA, hypertension, mixed hyperlipidemia, a history of DVT with subsequent pulmonary embolism/infarction in 2010.  He is on chronic Coumadin anticoagulation, and has a history of gout.  In March 2014 laboratory done by his primary physician continued to show an atherogenic dyslipidemia pattern with triglycerides of 307 VLDL 61 despite an LDL cholesterol of 57.   In April 2014, and aortic ultrasound demonstrated a 3.5x3.4 cm distal abdominal aortic aneurysm just proximal to the iliac bifurcation this had not changed from prior study of June 2013. On 04/23/2013, a nuclear perfusion study revealed abnormality involving the apex which is fixed and most consistent with his previous infarction involving the antral apical septum. There also is a suggestion of mild reversibility in the inferior inferolateral and lateral these have not been significantly changed from his prior studies.  He was admitted on 08/30/2013  to [facility] when he presented with acute gout. While in the emergency room he was noted to have a low PO2 of approximately  50 and was admitted for observation. He was treated with colchicine and was given a dose of steroids in the emergency room. His hypoxia did improve. However, at night he was noted to have significant nocturnal hypoxia and was placed on 2 L nasal cannula his cardiac enzymes were negative. While hospitalized he did have venous duplex imaging which was negative for bilateral lower extremity DVT. He was advised to have an echo Doppler study.  I saw him following his hospitalization and was concerned that obstructive sleep apnea  was contributory to his nocturnal oxygen desaturation. An echo Doppler study which showed an ejection fraction of 50-55% with moderate hypokinesis. He had mild mitral regurgitation. Pulmonary artery pressure was 31 mm; other valvular structures were normal.   He underwent a sleep study and due to severe sleep apnea this was a split night protocol. The baseline portion confirmed severe sleep apnea with an AHI of 41.4 per hour but REM sleep this was 60 per hour. He dropped his oxygen saturation to 78% in non-REM sleep and 73% with REM sleep and had loud snoring. He was titrated with CPAP up to 11 cm with excellent result. When I saw him in December 2014 he had not been consistently using his CPAP therapy.  Since that time, his has significantly improved usage.  He is now going to bed at 10:30 to 11 PM and waking up at 5 AM.  He is set at a CPAP pressure of 11 cm with a S 9 Elite Resmed CPAP unit.  On his most recent download from 12/22/2011 through 01/19/2014 he is meeting Medicare  compliance standards with usage days, being 97%, and 90% of the day is greater than 4 hours.  On his current setting, AHI is excellent at 1.8/hr.  He is unaware of breakthrough snoring.  Oftentimes, however, he falls asleep in a recliner and does not immediately go to bed Of note, I did review his laboratory as noted below.   Past Medical History  Diagnosis Date  . Hyperlipidemia   . Hypertension 09/06/12      ECHO-EF 45%   . Coronary artery disease 04/23/13    NUC STRESS TEST-EF 52% High risk scan two separate perfusion defects and a suggestion of reveribility in the inferior-inferolateral wall.  Marland Kitchen AAA (abdominal aortic aneurysm) 03/04/13    aortic duplex dopler- when compared tp previous study ther is no change. aneurysmal dilation measuring 3.5 x 3.4 cemtimeters  . Anxiety   . PE (pulmonary embolism)     Past Surgical History  Procedure Laterality Date  . Coronary artery bypass graft  2005  . Aortic valve replacement      No Known Allergies  Current Outpatient Prescriptions  Medication Sig Dispense Refill  . allopurinol (ZYLOPRIM) 100 MG tablet Take 2 tablets by mouth daily.      Marland Kitchen aspirin 81 MG tablet Take 81 mg by mouth daily.      . colchicine 0.6 MG tablet Take 0.6 mg by mouth daily. Takes PRN      . escitalopram (LEXAPRO) 10 MG tablet Take 10 mg by mouth daily. Takes 1/2 tablet daily      . furosemide (LASIX) 20 MG tablet TAKE 1 TABLET EVERY DAY  30 tablet  5  . irbesartan-hydrochlorothiazide (AVALIDE) 150-12.5 MG per tablet Take 1 tablet by mouth as needed.      . metoprolol succinate (TOPROL-XL) 25 MG 24 hr tablet Take 25 mg by mouth 2 (two) times daily.       . Omega-3 Krill Oil 300 MG CAPS Take 1 capsule by mouth daily.      . predniSONE (DELTASONE) 10 MG tablet as needed.       . rosuvastatin (CRESTOR) 5 MG tablet Take 5 mg by mouth daily.      Marland Kitchen warfarin (COUMADIN) 5 MG tablet Take 5 mg by mouth daily. As directed per INR       No current facility-administered medications for this visit.    Socially he is married for 34 years. He has 2 children 2 grandchildren. He completed sixth grade. He is self employed.  ROS General: Negative; No fevers, chills, or night sweats HEENT: Negative; No changes in vision or hearing, sinus congestion, difficulty swallowing Pulmonary: Negative; No cough, wheezing, shortness of breath, hemoptysis Cardiovascular: Negative; No chest pain,  presyncope, syncope, palpatations GI: Negative; No nausea, vomiting, diarrhea, or abdominal pain GU: Negative; No dysuria, hematuria, or difficulty voiding Musculoskeletal: Negative; no myalgias, joint pain, or weakness Hematologic: Negative; no easy bruising, bleeding Endocrine: Negative; no heat/cold intolerance Neuro: Negative; no changes in balance, headaches Skin: Negative; No rashes or skin lesions Psychiatric: Negative; No behavioral problems, depression Sleep: Positive for mild residual daytime sleepiness; , negative for breakthrough snoring utilizing CPAP,no bruxism, restless legs, hypnogognic hallucinations, no cataplexy   PE BP 127/61  Pulse 52  Ht _0  (1.803 m)  Wt 209 lb 9.6 oz (95.074 kg)  BMI 29.25 kg/m2  General: Alert, oriented, no distress.  Skin: normal turgor, no rashes HEENT: Normocephalic, atraumatic. Pupils round and reactive; sclera anicteric;no lid lag.  Nose without nasal septal hypertrophy Mouth/Parynx  benign; Mallinpatti scale 3 Neck: No JVD, no carotid bruits .  Normal carotid upstroke Lungs: clear to ausculatation and percussion; no wheezing or rales Chest wall: Nontender to palpation Heart: RRR, s1 s2 normal 2/6 systolic murmur; no AR.  No diastolic murmur, rubs, thrills or heaves Abdomen: soft, nontender; no hepatosplenomehaly, BS+; abdominal aorta nontender and not dilated by palpation. Back: No CVA tenderness Pulses 2+ Extremities: Trace right ankle edema, no clubbing cyanosis , Homan's sign negative  Neurologic: grossly nonfocal Psychological: Normal affect and mood   Prior 10/29/13 ECG: sinus rhythm with PACs.    LABS:  BMET    Component Value Date/Time   NA 146* 09/04/2013 1309   K 4.3 09/04/2013 1309   CL 105 09/04/2013 1309   CO2 30 09/04/2013 1309   GLUCOSE 77 09/04/2013 1309   BUN 27* 09/04/2013 1309   CREATININE 1.17 09/04/2013 1309   CREATININE 1.15 04/21/2009 1250   CALCIUM 9.5 09/04/2013 1309   GFRNONAA >60 04/21/2009 1250    GFRAA  Value: >60        The eGFR has been calculated using the MDRD equation. This calculation has not been validated in all clinical situations. eGFR's persistently <60 mL/min signify possible Chronic Kidney Disease. 04/21/2009 1250     Hepatic Function Panel     Component Value Date/Time   PROT 6.9 09/04/2013 1309   ALBUMIN 3.8 09/04/2013 1309   AST 32 09/04/2013 1309   ALT 41 09/04/2013 1309   ALKPHOS 103 09/04/2013 1309   BILITOT 0.9 09/04/2013 1309     CBC    Component Value Date/Time   WBC 7.0 05/21/2013 0940   RBC 4.45 05/21/2013 0940   HGB 14.4 05/21/2013 0940   HCT 41.8 05/21/2013 0940   PLT 183 05/21/2013 0940   MCV 93.9 05/21/2013 0940   MCH 32.4 05/21/2013 0940   MCHC 34.4 05/21/2013 0940   RDW 13.9 05/21/2013 0940   LYMPHSABS 1.4 04/21/2009 1250   MONOABS 1.1* 04/21/2009 1250   EOSABS 0.2 04/21/2009 1250   BASOSABS 0.0 04/21/2009 1250     BNP No results found for this basename: probnp    Lipid Panel     Component Value Date/Time   CHOL 125 05/21/2013 0940   TRIG 186* 05/21/2013 0940   HDL 32* 05/21/2013 0940   CHOLHDL 3.9 05/21/2013 0940   VLDL 37 05/21/2013 0940   LDLCALC 56 05/21/2013 0940     RADIOLOGY: No results found.    ASSESSMENT AND PLAN: Kenneth Scott is 10 years status post his anterior wall myocardial infarction subsequent PTCA of his LAD. He did derive significant myocardial salvage ultimately underwent elective CABG revascularization surgery for severe multivessel CAD. At that time, due to the significant asscending aortic aneurysmal dilatation he required a homograph aortic valve replacement and reimplantation of his coronary arteries. He has a documented moderate abdominal aortic aneurysm which is unchanged from previously. A recent nuclear perfusion study revealed 2 areas of abnormality not significantly changed from previously.  I did review Kenneth Scott's laboratory with him in detail. His lipid status was excellent on his current dose of rosuvastatin.   However, he tells me that he went off his cholesterol medicine.  Several weeks ago due to some mild leg cramps.  I have recommended that he resume Crestor at 5 mg every third day and initially, and also use coenzyme Q10 200-300 mg daily.  If he is unable to tolerate resumption of statin therapy on this regimen.  We will then initiate  treatment with Zetia 10 mg.  Present weight, his blood pressure is well-controlled today.  A long discussion with him regarding his obstructive sleep apnea.  His most recent download from 12/21/2013 through 01/19/2014 is significantly improved from his prior one in November to early December.  He now is meeting Medicare compliance standards.  His AHI is excellent at 1.8.  He is averaging 6 hours and 39 minutes of usage.  I discussed with him the importance of not falling asleep.  Several hours on the recliner and then going to bed after this.  This may account for some of his residual daytime sleepiness, since she's not using his CPAP therapy for his entire night of sleep.  He does feel more energetic.  I answered all his questions with reference to sleep issues.  He will try to reinitiate lipid-lowering therapy.  His blood pressure today is stable at 122/68 when rechecked by me.  He is tolerating his other medications.  He's not having palpitations.  He denies bleeding on Coumadin anticoagulation. I will see him in 6 months for cardiology reevaluation.   Troy Sine, MD, [facility]  03/23/2014 9:31 AM

## 2014-03-24 ENCOUNTER — Telehealth: Payer: Self-pay | Admitting: *Deleted

## 2014-03-24 NOTE — Telephone Encounter (Signed)
Returned a call to Patient's significant other to inquire about requested O2 pick up. She informs me that he was ordered O2 while in the hospital approximately 1 year ago. The patient now is not using it therefore she would like for the service to be discontinued. She states that Dr. Claiborne Billings did not order the oxygen. When she called the hospital to have them to get it picked up they informed her that they do not "do that" she will need to have their MD call to D/C the order. I told her that I would look in to what I needed to do and take care of it for her.

## 2014-03-24 NOTE — Telephone Encounter (Signed)
Spoke with Betsy @ [facility] to see if she can track down the ordering MD for patient's O2. She said the order came from a Dr. In [place]. I informed Gwinda Passe that I can ask Dr. Claiborne Billings to D/C the order when he returns to the office at the end of the week, however I cannot guarantee that he will do it since he was not the ordering MD. Gwinda Passe said that she will call patient's PCP Dr. Edrick Oh to see if she can get a order from his office. I asked Gwinda Passe to call me back to inform me of the outcome.

## 2014-08-28 ENCOUNTER — Telehealth: Payer: Self-pay | Admitting: *Deleted

## 2014-08-28 NOTE — Telephone Encounter (Signed)
This patient saw Nicoletta Ba PA-C as a new patient referred to [place] by Dr. Dione Housekeeper.  We scheduled  The Cologuard test for this patient on 02-24-2014.   The kit was delivered to the patient 02-27-2014.  [employer] made several attempts by mail and phone and could not reach the patient.We got notification todady 08-28-2014 that the patient did not complete the test. I let Amy Esterwood PA-C know.

## 2014-09-25 ENCOUNTER — Telehealth: Payer: Self-pay | Admitting: *Deleted

## 2014-09-25 NOTE — Telephone Encounter (Signed)
Returned signed order for water chamber for humidifier on CPAP machine to [facility].

## 2014-12-18 ENCOUNTER — Encounter: Payer: Self-pay | Admitting: *Deleted

## 2014-12-31 ENCOUNTER — Encounter: Payer: Self-pay | Admitting: Cardiovascular Disease

## 2014-12-31 ENCOUNTER — Ambulatory Visit (INDEPENDENT_AMBULATORY_CARE_PROVIDER_SITE_OTHER): Payer: Medicare Other | Admitting: Cardiovascular Disease

## 2014-12-31 VITALS — BP 138/76 | HR 52 | Ht 71.0 in | Wt 221.5 lb

## 2014-12-31 DIAGNOSIS — I714 Abdominal aortic aneurysm, without rupture, unspecified: Secondary | ICD-10-CM

## 2014-12-31 DIAGNOSIS — I2510 Atherosclerotic heart disease of native coronary artery without angina pectoris: Secondary | ICD-10-CM

## 2014-12-31 DIAGNOSIS — Z79899 Other long term (current) drug therapy: Secondary | ICD-10-CM

## 2014-12-31 DIAGNOSIS — I10 Essential (primary) hypertension: Secondary | ICD-10-CM

## 2014-12-31 DIAGNOSIS — E785 Hyperlipidemia, unspecified: Secondary | ICD-10-CM

## 2014-12-31 NOTE — Progress Notes (Signed)
Patient ID: Kenneth Scott, male   DOB: 09-12-1946, 69 y.o.   MRN: [id number]     HPI: Kenneth Scott is a 69 y.o. male who presents for followup cardiology evaluation.  I last saw him in April 2015.  Kenneth Scott has known CAD and suffered an anterior wall myocardial infarction treated with acute PTCA of his LAD for myocardial salvage in August 2005. Due to significant multivessel coronary artery disease his LAD was not stented and he underwent elective CABG surgery on 07/29/2004 by Dr. Cyndia Bent with a LIMA placed to the LAD, vein to the diagonal, vein to the marginal, vein to the second diagonal, and vein to the PDA and PLA vessels. Because of significant aneurysmal dilatation of the ascending aorta he also underwent aortic root resection with aortic valve replacement using a 25 mm homograft and reimplantation of his coronary arteries. Additional problems include a history of TIA, hypertension, mixed hyperlipidemia, a history of DVT with subsequent pulmonary embolism/infarction in 2010.  He is on chronic Coumadin anticoagulation, and has a history of gout.  He has a history of hyperlipidemia with an atherogenic dyslipidemia pattern with triglycerides of 307 VLDL 61 despite an LDL cholesterol of 57.  In April 2014, and aortic ultrasound demonstrated a 3.5x3.4 cm distal abdominal aortic aneurysm just proximal to the iliac bifurcation this had not changed from prior study of June 2013. On 04/23/2013, a nuclear perfusion study revealed abnormality involving the apex which is fixed and most consistent with his previous infarction involving the antral apical septum. There was suggestion of mild reversibility in the inferior inferolateral and lateral these have not been significantly changed from his prior studies.  He was admitted on 08/30/2013  to [facility] when he presented with acute gout. While in the emergency room he was noted to have a low PO2 of approximately 50 and was admitted for observation.  He was treated with colchicine and was given a dose of steroids in the emergency room. His hypoxia did improve. However, at night he was noted to have significant nocturnal hypoxia and was placed on 2 L nasal cannula his cardiac enzymes were negative. While hospitalized he did have venous duplex imaging which was negative for bilateral lower extremity DVT. He was advised to have an echo Doppler study.  I saw him following his hospitalization and was concerned that obstructive sleep apnea  was contributory to his nocturnal oxygen desaturation. An echo Doppler study showed an ejection fraction of 50-55% with moderate hypokinesis. He had mild mitral regurgitation. Pulmonary artery pressure was 31 mm; other valvular structures were normal.   He underwent a sleep study and due to severe sleep apnea this was a split night protocol. The baseline portion confirmed severe sleep apnea with an AHI of 41.4 per hour but REM sleep this was 60 per hour. He dropped his oxygen saturation to 78% in non-REM sleep and 73% with REM sleep and had loud snoring. He was titrated with CPAP up to 11 cm with excellent result. When I saw him in December 2014 he had not been consistently using his CPAP therapy.  Since that time, his has significantly improved usage. He is set at a CPAP pressure of 11 cm with a S 9 Elite Resmed CPAP unit.  He now admits to good sleep.  However, at times, he still falls asleep on a recliner before going to bed.  Presently, he denies any exertional chest pain.  At times he notes left shoulder discomfort.  He denies  presyncope or syncope.  At times he admits to some fatigue.  He denies significant swelling.  He presents for evaluation.   Past Medical History  Diagnosis Date  . Hyperlipidemia   . Hypertension 09/06/12    ECHO-EF 45%   . Coronary artery disease 04/23/13    NUC STRESS TEST-EF 52% High risk scan two separate perfusion defects and a suggestion of reveribility in the inferior-inferolateral  wall.  Marland Kitchen AAA (abdominal aortic aneurysm) 03/04/13    aortic duplex dopler- when compared tp previous study ther is no change. aneurysmal dilation measuring 3.5 x 3.4 cemtimeters  . Anxiety   . PE (pulmonary embolism)   . TIA (transient ischemic attack)     Past Surgical History  Procedure Laterality Date  . Coronary artery bypass graft  2005    By Dr Cyndia Bent, He had a LIMA placed to the LAD, a vien to the diagonal, a vein to the marginal, a vein to the 2nd diagonal, a vein  to the PDA, PLA vessel.  . Aortic valve replacement      Using a 25 mm homograft with reimplanation of his coronary arteries    No Known Allergies  Current Outpatient Prescriptions  Medication Sig Dispense Refill  . allopurinol (ZYLOPRIM) 100 MG tablet Take 2 tablets by mouth daily.    Marland Kitchen aspirin 81 MG tablet Take 81 mg by mouth daily.    . colchicine 0.6 MG tablet Take 0.6 mg by mouth daily. Takes PRN    . escitalopram (LEXAPRO) 10 MG tablet Take 10 mg by mouth daily. Takes 1/2 tablet daily    . furosemide (LASIX) 20 MG tablet TAKE 1 TABLET EVERY DAY 30 tablet 5  . irbesartan-hydrochlorothiazide (AVALIDE) 150-12.5 MG per tablet Take 1 tablet by mouth as needed.    . metoprolol succinate (TOPROL-XL) 25 MG 24 hr tablet Take 25 mg by mouth 2 (two) times daily.     . Omega-3 Krill Oil 300 MG CAPS Take 1 capsule by mouth daily.    . predniSONE (DELTASONE) 10 MG tablet as needed.     . rosuvastatin (CRESTOR) 5 MG tablet Take 5 mg by mouth daily.    Marland Kitchen warfarin (COUMADIN) 5 MG tablet Take 5 mg by mouth daily. As directed per INR     No current facility-administered medications for this visit.    Socially he is married for 34 years. He has 2 children 2 grandchildren. He completed sixth grade. He is self employed.  ROS General: Negative; No fevers, chills, or night sweats HEENT: Negative; No changes in vision or hearing, sinus congestion, difficulty swallowing Pulmonary: Negative; No cough, wheezing, shortness of  breath, hemoptysis Cardiovascular: Negative; No chest pain, presyncope, syncope, palpatations GI: Negative; No nausea, vomiting, diarrhea, or abdominal pain GU: Negative; No dysuria, hematuria, or difficulty voiding Musculoskeletal: Positive for left shoulder discomfort Hematologic: Negative; no easy bruising, bleeding Endocrine: Negative; no heat/cold intolerance Neuro: Negative; no changes in balance, headaches Skin: Negative; No rashes or skin lesions Psychiatric: Negative; No behavioral problems, depression Sleep: Positive for mild residual daytime sleepiness;  negative for breakthrough snoring utilizing CPAP,no bruxism, restless legs, hypnogognic hallucinations, no cataplexy   PE BP 138/76 mmHg  Pulse 52  Ht '5\' 11"'  (1.803 m)  Wt 221 lb 8 oz (100.472 kg)  BMI 30.91 kg/m2  General: Alert, oriented, no distress.  Skin: normal turgor, no rashes HEENT: Normocephalic, atraumatic. Pupils round and reactive; sclera anicteric;no lid lag.  Nose without nasal septal hypertrophy Mouth/Parynx benign; Mallinpatti scale 3  Neck: No JVD, no carotid bruits .  Normal carotid upstroke Lungs: clear to ausculatation and percussion; no wheezing or rales Chest wall: Nontender to palpation Heart: RRR, s1 s2 normal 2/6 systolic murmur; no AR.  No diastolic murmur, rubs, thrills or heaves Abdomen: soft, nontender; no hepatosplenomehaly, BS+; abdominal aorta nontender and not dilated by palpation. Back: No CVA tenderness Pulses 2+ Extremities: Trace  ankle edema, no clubbing cyanosis , Homan's sign negative  Neurologic: grossly nonfocal Psychological: Normal affect and mood  ECG (independently read by me): Sinus bradycardia 52 bpm.  Mild T wave abnormality in aVL.  Otherwise no significant abnormalities.   Prior 10/29/13 ECG: sinus rhythm with PACs.    LABS:  BMET    Component Value Date/Time   NA 146* 09/04/2013 1309   K 4.3 09/04/2013 1309   CL 105 09/04/2013 1309   CO2 30 09/04/2013 1309    GLUCOSE 77 09/04/2013 1309   BUN 27* 09/04/2013 1309   CREATININE 1.17 09/04/2013 1309   CREATININE 1.15 04/21/2009 1250   CALCIUM 9.5 09/04/2013 1309   GFRNONAA >60 04/21/2009 1250   GFRAA  04/21/2009 1250    >60        The eGFR has been calculated using the MDRD equation. This calculation has not been validated in all clinical situations. eGFR's persistently <60 mL/min signify possible Chronic Kidney Disease.     Hepatic Function Panel     Component Value Date/Time   PROT 6.9 09/04/2013 1309   ALBUMIN 3.8 09/04/2013 1309   AST 32 09/04/2013 1309   ALT 41 09/04/2013 1309   ALKPHOS 103 09/04/2013 1309   BILITOT 0.9 09/04/2013 1309     CBC    Component Value Date/Time   WBC 7.0 05/21/2013 0940   RBC 4.45 05/21/2013 0940   HGB 14.4 05/21/2013 0940   HCT 41.8 05/21/2013 0940   PLT 183 05/21/2013 0940   MCV 93.9 05/21/2013 0940   MCH 32.4 05/21/2013 0940   MCHC 34.4 05/21/2013 0940   RDW 13.9 05/21/2013 0940   LYMPHSABS 1.4 04/21/2009 1250   MONOABS 1.1* 04/21/2009 1250   EOSABS 0.2 04/21/2009 1250   BASOSABS 0.0 04/21/2009 1250     BNP No results found for: PROBNP  Lipid Panel     Component Value Date/Time   CHOL 125 05/21/2013 0940   TRIG 186* 05/21/2013 0940   HDL 32* 05/21/2013 0940   CHOLHDL 3.9 05/21/2013 0940   VLDL 37 05/21/2013 0940   LDLCALC 56 05/21/2013 0940     RADIOLOGY: No results found.    ASSESSMENT AND PLAN: Mr. Endsley is 10 1/2 years status post his anterior wall myocardial infarction and PTCA of his LAD. He derived significant myocardial salvage ultimately underwent elective CABG revascularization surgery for severe multivessel CAD. At that time, due to the significant asscending aortic aneurysmal dilatation he required a homograph aortic valve replacement and reimplantation of his coronary arteries. He has a documented moderate abdominal aortic aneurysm which is unchanged from previously.His last nuclear perfusion study in  May 2014 revealed 2 areas of abnormality not significantly changed from previously.  Currently, his blood pressure is controlled and on repeat by me was 124/70 on his current therapy consisting of Avalide 150/12.5, Toprol-XL, which he has been taking 50 mg daily in addition to furosemide 20 mg.  There is only trivial lower extremity edema.  He is bradycardic today with a resting pulse of 52.  I recommended slight reduction of his Toprol-XL dose to 37.5 mg daily.  He is on Crestor 5 mg daily for hyperlipidemia.  He is on Coumadin anticoagulation, which is in his INR is followed by Dr. Truman Hayward.  He has a history of gout but denies recent episodes and is continuing on value.  I'll and colchicine.  He has sleep apnea and admits to 100% compliance with reference to use.  At times he still falls asleep in the evening while sitting in a recliner watching television.  I have recommended fasting laboratory be obtained consisting of CBC, chemistry profile, thyroid studies, and lipid studies.  In approximately 3-4 months.  He will have a follow-up abdominal ultrasound to reassess his abdominal aortic aneurysm.  We will also schedule him for a follow-up echo Doppler study to reassess his systolic and diastolic function.  I will see him in approximately 5-6 months for follow up evaluation and further recommendations will be made at that time.  Troy Sine, MD, [facility]  12/31/2014 4:38 PM

## 2014-12-31 NOTE — Patient Instructions (Addendum)
Your physician has requested that you have an echocardiogram. Echocardiography is a painless test that uses sound waves to create images of your heart. It provides your doctor with information about the size and shape of your heart and how well your heart's chambers and valves are working. This procedure takes approximately one hour. There are no restrictions for this procedure.  Your physician has requested that you have an abdominal aorta duplex. During this test, an ultrasound is used to evaluate the aorta. Allow 30 minutes for this exam. Do not eat after midnight the day before and avoid carbonated beverages  These studies swill be done around  April or May.  Your physician wants you to follow-up in: July. You will receive a reminder letter in the mail two months in advance. If you don't receive a letter, please call our office to schedule the follow-up appointment.  Your physician recommends that you return for lab work FASTING.  Your physician has recommended you make the following change in your medication: decrease the metoprolol succ to 1 & 1/2 tablet daily. Take them at the same time.

## 2015-01-01 ENCOUNTER — Encounter: Payer: Self-pay | Admitting: Cardiovascular Disease

## 2015-01-26 ENCOUNTER — Telehealth: Payer: Self-pay | Admitting: Cardiovascular Disease

## 2015-01-26 MED ORDER — NITROGLYCERIN 0.4 MG SL SUBL: 0.4000 mg | SUBLINGUAL_TABLET | SUBLINGUAL | Status: DC | PRN

## 2015-01-26 NOTE — Telephone Encounter (Signed)
Called patient. He has been on medication since 2013. No chest pain, just needs refill.  Rx(s) sent to pharmacy electronically.

## 2015-01-26 NOTE — Telephone Encounter (Signed)
°  1. Which medications need to be refilled? Nitrostat2. Which pharmacy is medication to be sent to?[phone]  3. Do they need a 30 day or 90 day supply? 30 and refills  4. Would they like a call back once the medication has been sent to the pharmacy? no

## 2015-01-27 ENCOUNTER — Encounter: Payer: Self-pay | Admitting: *Deleted

## 2015-05-13 ENCOUNTER — Telehealth (HOSPITAL_COMMUNITY): Payer: Self-pay | Admitting: *Deleted

## 2015-05-14 ENCOUNTER — Telehealth: Payer: Self-pay | Admitting: Cardiovascular Disease

## 2015-05-14 NOTE — Telephone Encounter (Signed)
Closed encounter °

## 2015-05-19 ENCOUNTER — Other Ambulatory Visit: Payer: Self-pay | Admitting: Cardiovascular Disease

## 2015-05-19 DIAGNOSIS — I714 Abdominal aortic aneurysm, without rupture, unspecified: Secondary | ICD-10-CM

## 2015-06-15 ENCOUNTER — Ambulatory Visit (HOSPITAL_BASED_OUTPATIENT_CLINIC_OR_DEPARTMENT_OTHER)
Admission: RE | Admit: 2015-06-15 | Discharge: 2015-06-15 | Disposition: A | Discharge status: Home / Self Care | Payer: Medicare Other | Source: Ambulatory Visit | Attending: Cardiology | Admitting: Cardiology

## 2015-06-15 ENCOUNTER — Ambulatory Visit (HOSPITAL_COMMUNITY)
Admission: RE | Admit: 2015-06-15 | Discharge: 2015-06-15 | Disposition: A | Discharge status: Home / Self Care | Payer: Medicare Other | Source: Ambulatory Visit | Attending: Cardiology | Admitting: Cardiology

## 2015-06-15 DIAGNOSIS — G4730 Sleep apnea, unspecified: Secondary | ICD-10-CM | POA: Diagnosis not present

## 2015-06-15 DIAGNOSIS — I2510 Atherosclerotic heart disease of native coronary artery without angina pectoris: Secondary | ICD-10-CM | POA: Insufficient documentation

## 2015-06-15 DIAGNOSIS — I10 Essential (primary) hypertension: Secondary | ICD-10-CM | POA: Diagnosis not present

## 2015-06-15 DIAGNOSIS — I714 Abdominal aortic aneurysm, without rupture, unspecified: Secondary | ICD-10-CM

## 2015-06-15 DIAGNOSIS — I340 Nonrheumatic mitral (valve) insufficiency: Secondary | ICD-10-CM | POA: Diagnosis not present

## 2015-06-15 DIAGNOSIS — E785 Hyperlipidemia, unspecified: Secondary | ICD-10-CM | POA: Diagnosis not present

## 2015-06-15 DIAGNOSIS — I723 Aneurysm of iliac artery: Secondary | ICD-10-CM | POA: Insufficient documentation

## 2015-06-15 NOTE — Progress Notes (Signed)
AAA Completed.  Oda Cogan, BS, RDMS, RVT

## 2015-08-14 ENCOUNTER — Ambulatory Visit (INDEPENDENT_AMBULATORY_CARE_PROVIDER_SITE_OTHER): Payer: Medicare Other | Admitting: Cardiovascular Disease

## 2015-08-14 VITALS — BP 108/80 | HR 55 | Ht 71.0 in | Wt 217.0 lb

## 2015-08-14 DIAGNOSIS — E782 Mixed hyperlipidemia: Secondary | ICD-10-CM

## 2015-08-14 DIAGNOSIS — I25810 Atherosclerosis of coronary artery bypass graft(s) without angina pectoris: Secondary | ICD-10-CM

## 2015-08-14 DIAGNOSIS — Z952 Presence of prosthetic heart valve: Secondary | ICD-10-CM

## 2015-08-14 DIAGNOSIS — G4733 Obstructive sleep apnea (adult) (pediatric): Secondary | ICD-10-CM

## 2015-08-14 DIAGNOSIS — I2510 Atherosclerotic heart disease of native coronary artery without angina pectoris: Secondary | ICD-10-CM

## 2015-08-14 DIAGNOSIS — Z79899 Other long term (current) drug therapy: Secondary | ICD-10-CM

## 2015-08-14 DIAGNOSIS — M109 Gout, unspecified: Secondary | ICD-10-CM | POA: Diagnosis not present

## 2015-08-14 DIAGNOSIS — Z954 Presence of other heart-valve replacement: Secondary | ICD-10-CM

## 2015-08-14 DIAGNOSIS — Z9989 Dependence on other enabling machines and devices: Secondary | ICD-10-CM

## 2015-08-14 LAB — COMPREHENSIVE METABOLIC PANEL
ALT: 44 U/L (ref 9–46)
AST: 37 U/L — AB (ref 10–35)
Albumin: 4.6 g/dL (ref 3.6–5.1)
Alkaline Phosphatase: 99 U/L (ref 40–115)
BILIRUBIN TOTAL: 0.8 mg/dL (ref 0.2–1.2)
BUN: 27 mg/dL — ABNORMAL HIGH (ref 7–25)
CO2: 30 mmol/L (ref 20–31)
CREATININE: 1.32 mg/dL — AB (ref 0.70–1.18)
Calcium: 9.4 mg/dL (ref 8.6–10.3)
Chloride: 104 mmol/L (ref 98–110)
GLUCOSE: 85 mg/dL (ref 65–99)
Potassium: 3.8 mmol/L (ref 3.5–5.3)
SODIUM: 145 mmol/L (ref 135–146)
Total Protein: 6.9 g/dL (ref 6.1–8.1)

## 2015-08-14 LAB — URIC ACID: Uric Acid, Serum: 6.2 mg/dL (ref 4.0–7.8)

## 2015-08-14 LAB — LIPID PANEL
Cholesterol: 123 mg/dL — ABNORMAL LOW (ref 125–200)
HDL: 31 mg/dL — ABNORMAL LOW (ref >=40)
LDL Cholesterol: 56 mg/dL (ref <130)
Total CHOL/HDL Ratio: 4 Ratio (ref <=5.0)
Triglycerides: 181 mg/dL — ABNORMAL HIGH (ref <150)
VLDL: 36 mg/dL — ABNORMAL HIGH (ref <30)

## 2015-08-14 LAB — CBC
HCT: 44.9 % (ref 39.0–52.0)
Hemoglobin: 15.5 g/dL (ref 13.0–17.0)
MCH: 34.2 pg — ABNORMAL HIGH (ref 26.0–34.0)
MCHC: 34.5 g/dL (ref 30.0–36.0)
MCV: 99.1 fL (ref 78.0–100.0)
MPV: 11.8 fL (ref 8.6–12.4)
Platelets: 163 10*3/uL (ref 150–400)
RBC: 4.53 MIL/uL (ref 4.22–5.81)
RDW: 14.3 % (ref 11.5–15.5)
WBC: 7.2 10*3/uL (ref 4.0–10.5)

## 2015-08-14 NOTE — Patient Instructions (Signed)
Your physician recommends that you return for lab work fasting.,  Your physician wants you to follow-up in: 6 months or sooner if needed. You will receive a reminder letter in the mail two months in advance. If you don't receive a letter, please call our office to schedule the follow-up appointment.

## 2015-08-15 LAB — TSH: TSH: 2.675 u[IU]/mL (ref 0.350–4.500)

## 2015-08-17 ENCOUNTER — Encounter: Payer: Self-pay | Admitting: Cardiovascular Disease

## 2015-08-17 NOTE — Progress Notes (Signed)
Patient ID: Kenneth Scott, male   DOB: 1946/02/13, 69 y.o.   MRN: [id number]     HPI: Kenneth Scott is a 69 y.o. male who presents for a 7 month followup cardiology evaluation.    Kenneth Scott has known CAD and suffered an anterior wall myocardial infarction treated with acute PTCA of his LAD for myocardial salvage in August 2005. Due to significant multivessel coronary artery disease his LAD was not stented and Kenneth Scott underwent elective CABG surgery on 07/29/2004 by Dr. Cyndia Bent with a LIMA placed to the LAD, vein to the diagonal, vein to the marginal, vein to the second diagonal, and vein to the PDA and PLA vessels. Because of significant aneurysmal dilatation of the ascending aorta Kenneth Scott also underwent aortic root resection with aortic valve replacement using a 25 mm homograft and reimplantation of his coronary arteries. Additional problems include a history of TIA, hypertension, mixed hyperlipidemia, a history of DVT with subsequent pulmonary embolism/infarction in 2010.  Kenneth Scott is on chronic Coumadin anticoagulation, and has a history of gout.  Kenneth Scott has a history of hyperlipidemia with an atherogenic dyslipidemia pattern with triglycerides of 307 VLDL 61 despite an LDL cholesterol of 57.  In April 2014, and aortic ultrasound demonstrated a 3.5x3.4 cm distal abdominal aortic aneurysm just proximal to the iliac bifurcation this had not changed from prior study of June 2013. On 04/23/2013, a nuclear perfusion study revealed abnormality involving the apex which is fixed and most consistent with his previous infarction involving the antral apical septum. There was suggestion of mild reversibility in the inferior inferolateral and lateral these have not been significantly changed from his prior studies.  Kenneth Scott was admitted to [facility] when Kenneth Scott presented with acute gout on 08/30/2013 and while in the emergency room Kenneth Scott was noted to have a low PO2 of approximately 50 and was admitted for observation. Kenneth Scott was treated  with colchicine and was given a dose of steroids in the emergency room. His hypoxia did improve. However, at night Kenneth Scott was noted to have significant nocturnal hypoxia and was placed on 2 L nasal cannula his cardiac enzymes were negative. While hospitalized Kenneth Scott did have venous duplex imaging which was negative for bilateral lower extremity DVT. Kenneth Scott was advised to have an echo Doppler study.  I saw him following his hospitalization and was concerned that obstructive sleep apnea  was contributory to his nocturnal oxygen desaturation. An echo Doppler study showed an ejection fraction of 50-55% with moderate hypokinesis. Kenneth Scott had mild mitral regurgitation. Pulmonary artery pressure was 31 mm; other valvular structures were normal.   Kenneth Scott underwent a sleep study and due to severe sleep apnea this was a split night protocol. The baseline portion confirmed severe sleep apnea with an AHI of 41.4 per hour but REM sleep this was 60 per hour. Kenneth Scott dropped his oxygen saturation to 78% in non-REM sleep and 73% with REM sleep and had loud snoring. Kenneth Scott was titrated with CPAP up to 11 cm with excellent result. When I saw him in December 2014 Kenneth Scott had not been consistently using his CPAP therapy.  Since that time, his has significantly improved usage. Kenneth Scott is set at a CPAP pressure of 11 cm with a S 9 Elite Resmed CPAP unit.  Kenneth Scott now admits to good sleep.  However, at times, Kenneth Scott still falls asleep on a recliner before going to bed.  Since I last saw him 6 months ago, Kenneth Scott underwent a follow-up echo Doppler study on 06/15/2015.  This revealed an  ejection fraction of 45-50%.  There was akinesis of the mid anteroseptal and distal septal walls.  No mention was made in the report of his aortic valve replacement. Kenneth Scott continues to work long hours in [employer] business at least 6 days per week from a least 5:30 morning until 6 PM at night. Kenneth Scott denies any chest pain. Kenneth Scott has been on Avalide 150/12.5 and furosemide 20 mg in addition to Toprol-XL 37.5 mg  for blood pressure. Kenneth Scott is on warfarin anticoagulation.  Kenneth Scott is on Crestor 4.  Hyperlipidemia and continues to take albuterol/culture seen for his gout history. Kenneth Scott presents for evaluation.  Past Medical History  Diagnosis Date  . Hyperlipidemia   . Hypertension 09/06/12    ECHO-EF 45%   . Coronary artery disease 04/23/13    NUC STRESS TEST-EF 52% High risk scan two separate perfusion defects and a suggestion of reveribility in the inferior-inferolateral wall.  Marland Kitchen AAA (abdominal aortic aneurysm) 03/04/13    aortic duplex dopler- when compared tp previous study ther is no change. aneurysmal dilation measuring 3.5 x 3.4 cemtimeters  . Anxiety   . PE (pulmonary embolism)   . TIA (transient ischemic attack)     Past Surgical History  Procedure Laterality Date  . Coronary artery bypass graft  2005    By Dr Cyndia Bent, Kenneth Scott had a LIMA placed to the LAD, a vien to the diagonal, a vein to the marginal, a vein to the 2nd diagonal, a vein  to the PDA, PLA vessel.  . Aortic valve replacement      Using a 25 mm homograft with reimplanation of his coronary arteries    No Known Allergies  Current Outpatient Prescriptions  Medication Sig Dispense Refill  . allopurinol (ZYLOPRIM) 100 MG tablet Take 2 tablets by mouth daily.    Marland Kitchen aspirin 81 MG tablet Take 81 mg by mouth daily.    . colchicine 0.6 MG tablet Take 0.6 mg by mouth daily. Takes PRN    . escitalopram (LEXAPRO) 10 MG tablet Take 10 mg by mouth daily. Takes 1/2 tablet daily    . furosemide (LASIX) 20 MG tablet TAKE 1 TABLET EVERY DAY 30 tablet 5  . irbesartan-hydrochlorothiazide (AVALIDE) 150-12.5 MG per tablet Take 1 tablet by mouth as needed.    . metoprolol succinate (TOPROL-XL) 25 MG 24 hr tablet Take 37.5 mg by mouth daily.    . nitroGLYCERIN (NITROSTAT) 0.4 MG SL tablet Place 1 tablet (0.4 mg total) under the tongue every 5 (five) minutes as needed for chest pain. 25 tablet 2  . Omega-3 Krill Oil 300 MG CAPS Take 1 capsule by mouth daily.      . predniSONE (DELTASONE) 10 MG tablet as needed.     . rosuvastatin (CRESTOR) 5 MG tablet Take 5 mg by mouth daily.    Marland Kitchen warfarin (COUMADIN) 5 MG tablet Take 5 mg by mouth daily. As directed per INR     No current facility-administered medications for this visit.    Socially Kenneth Scott is married for 34 years. Kenneth Scott has 2 children 2 grandchildren. Kenneth Scott completed sixth grade. 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  ROS General: Negative; No fevers, chills, or night sweats HEENT: Negative; No changes in vision or hearing, sinus congestion, difficulty swallowing Pulmonary: Negative; No cough, wheezing, shortness of breath, hemoptysis Cardiovascular: Negative; No chest pain, presyncope, syncope, palpatations GI: Negative; No nausea, vomiting, diarrhea, or abdominal pain GU: Negative; No dysuria, hematuria, or difficulty voiding Musculoskeletal: Positive for left shoulder discomfort Rheumatologic: history of  gout Hematologic: Negative; no easy bruising, bleeding Endocrine: Negative; no heat/cold intolerance Neuro: Negative; no changes in balance, headaches Skin: Negative; No rashes or skin lesions Psychiatric: Negative; No behavioral problems, depression Sleep: Positive for mild residual daytime sleepiness;  negative for breakthrough snoring utilizing CPAP,no bruxism, restless legs, hypnogognic hallucinations, no cataplexy   PE BP 108/80 mmHg  Pulse 55  Ht '5\' 11"'  (1.803 m)  Wt 217 lb (98.431 kg)  BMI 30.28 kg/m2   Wt Readings from Last 3 Encounters:  08/14/15 217 lb (98.431 kg)  12/31/14 221 lb 8 oz (100.472 kg)  03/04/14 209 lb 9.6 oz (95.074 kg)   General: Alert, oriented, no distress.  Skin: normal turgor, no rashes HEENT: Normocephalic, atraumatic. Pupils round and reactive; sclera anicteric;no lid lag.  Nose without nasal septal hypertrophy Mouth/Parynx benign; Mallinpatti scale 3 Neck: No JVD, no carotid bruits .  Normal carotid upstroke Lungs: clear to ausculatation and percussion; no wheezing or  rales Chest wall: Nontender to palpation Heart: RRR, s1 s2 normal 2/6 systolic murmur; no AR.  No diastolic murmur, rubs, thrills or heaves Abdomen: soft, nontender; no hepatosplenomehaly, BS+; abdominal aorta nontender and not dilated by palpation. Back: No CVA tenderness Pulses 2+ Extremities: Trivial ankle edema, no clubbing cyanosis , Homan's sign negative  Neurologic: grossly nonfocal Psychological: Normal affect and mood  ECG (independently read by me):  Sinus bradycardia at 55 bpm with sinus arrhythmia.  No significant ST changes.   February 2000 16ECG (independently read by me): Sinus bradycardia 52 bpm.  Mild T wave abnormality in aVL.  Otherwise no significant abnormalities.   Prior 10/29/13 ECG: sinus rhythm with PACs.  LABS: BMP Latest Ref Rng 08/14/2015 09/04/2013 05/21/2013  Glucose 65 - 99 mg/dL 85 77 92  BUN 7 - 25 mg/dL 27(H) 27(H) 21  Creatinine 0.70 - 1.18 mg/dL 1.32(H) 1.17 1.34  Sodium 135 - 146 mmol/L 145 146(H) 145  Potassium 3.5 - 5.3 mmol/L 3.8 4.3 4.2  Chloride 98 - 110 mmol/L 104 105 105  CO2 20 - 31 mmol/L '30 30 30  ' Calcium 8.6 - 10.3 mg/dL 9.4 9.5 9.5   Hepatic Function Latest Ref Rng 08/14/2015 09/04/2013 05/21/2013  Total Protein 6.1 - 8.1 g/dL 6.9 6.9 7.0  Albumin 3.6 - 5.1 g/dL 4.6 3.8 4.2  AST 10 - 35 U/L 37(H) 32 25  ALT 9 - 46 U/L 44 41 26  Alk Phosphatase 40 - 115 U/L 99 103 90  Total Bilirubin 0.2 - 1.2 mg/dL 0.8 0.9 0.6   CBC Latest Ref Rng 08/14/2015 05/21/2013 04/21/2009  WBC 4.0 - 10.5 K/uL 7.2 7.0 14.5(H)  Hemoglobin 13.0 - 17.0 g/dL 15.5 14.4 12.0(L)  Hematocrit 39.0 - 52.0 % 44.9 41.8 35.0(L)  Platelets 150 - 400 K/uL 163 183 372   Lab Results  Component Value Date   MCV 99.1 08/14/2015   MCV 93.9 05/21/2013   MCV 97.0 04/21/2009   Lab Results  Component Value Date   TSH 2.675 08/14/2015   Lab Results  Component Value Date   HGBA1C  04/06/2009    5.4 (NOTE) The ADA recommends the following therapeutic goal for glycemic control  related to Hgb A1c measurement: Goal of therapy: <6.5 Hgb A1c  Reference: American Diabetes Association: Clinical Practice Recommendations 2010, Diabetes Care, 2010, 33: (Suppl  1).     RADIOLOGY: No results found.    ASSESSMENT AND PLAN: Kenneth Scott is a 69 year old Caucasian male who is 11 years status post his anterior wall myocardial infarction and  PTCA of his LAD. Kenneth Scott derived significant myocardial salvage ultimately underwent elective CABG revascularization surgery for severe multivessel CAD. At that time, due to the significant asscending aortic aneurysmal dilatation Kenneth Scott required a homograph aortic valve replacement and reimplantation of his coronary arteries. Kenneth Scott has a documented moderate abdominal aortic aneurysm which is unchanged from previously.His last nuclear perfusion study in May 2014 revealed 2 areas of abnormality not significantly changed from previously.  His blood pressure is controlled and on repeat by me was 122/78 on  Avalide 150/12.5, Toprol-XL, which Kenneth Scott has been taking 37.5 mg daily in addition to furosemide 20 mg.   When I last saw him, Kenneth Scott was bradycardic with heart rates in the low 50s and I reduced his Toprol from 50 mg to 37.5 mg daily.  Kenneth Scott is on Crestor 5 mg daily for hyperlipidemia.  Kenneth Scott is on Coumadin anticoagulation, which is in his INR is followed by Dr. Edrick Oh.   Kenneth Scott continues to be on Allopurinol and colchicine for gout and has not had recent recurrence. I am recommending fasting laboratory be obtained and will also include a uric acid level. Kenneth Scott will continue his current therapy.  Kenneth Scott continues to use CPAP with 100% compliance and is unaware of any breakthrough snoring.  Kenneth Scott denies residual daytime sleepiness.  I will see him in 6 months for reevaluation or sooner if problems arise.   Time spent: 25 minutes Troy Sine, MD, [facility]  08/17/2015 4:46 PM

## 2015-09-11 ENCOUNTER — Encounter: Payer: Self-pay | Admitting: *Deleted

## 2015-09-11 ENCOUNTER — Telehealth: Payer: Self-pay | Admitting: Cardiovascular Disease

## 2015-09-11 NOTE — Telephone Encounter (Signed)
Kenneth Scott returned call. Informed her I was looking for Mr. Flagg as their is no DPR on file stating I can speak with her about patient's PHI. When I asked her relation, she did not state this and instead reported that she always brings him to his appointments. Informed Kenneth Scott that the echo was done in July and she then realized it was lab work he had done. 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 Letter with lab results and DPR form mailed to patient.

## 2015-09-11 NOTE — Telephone Encounter (Signed)
LM for patient to call back.  No DPR on file to speak with Teryl Lucy - and unsure of relation.   Of note, echo was done in July:  Result Notes    Notes Recorded by Tressa Busman, CMA on 06/18/2015 at 5:01 PM Echo results called and given to wife. Voiced understanding. ------  Notes Recorded by Troy Sine, MD on 06/16/2015 at 5:36 PM EF 45 - 50% with WMA of mid distal anteroseptal/septal walls

## 2015-09-11 NOTE — Telephone Encounter (Signed)
Pt would like his echo results please.

## 2016-07-26 ENCOUNTER — Other Ambulatory Visit: Payer: Self-pay | Admitting: Cardiovascular Disease

## 2016-07-29 ENCOUNTER — Encounter: Payer: Self-pay | Admitting: Cardiovascular Disease

## 2016-07-29 ENCOUNTER — Ambulatory Visit (INDEPENDENT_AMBULATORY_CARE_PROVIDER_SITE_OTHER): Payer: Medicare Other | Admitting: Cardiovascular Disease

## 2016-07-29 VITALS — BP 98/70 | HR 47 | Ht 71.0 in | Wt 213.2 lb

## 2016-07-29 DIAGNOSIS — I2510 Atherosclerotic heart disease of native coronary artery without angina pectoris: Secondary | ICD-10-CM

## 2016-07-29 DIAGNOSIS — I714 Abdominal aortic aneurysm, without rupture, unspecified: Secondary | ICD-10-CM

## 2016-07-29 DIAGNOSIS — I7121 Aneurysm of the ascending aorta, without rupture: Secondary | ICD-10-CM

## 2016-07-29 DIAGNOSIS — Z954 Presence of other heart-valve replacement: Secondary | ICD-10-CM

## 2016-07-29 DIAGNOSIS — I719 Aortic aneurysm of unspecified site, without rupture: Secondary | ICD-10-CM

## 2016-07-29 DIAGNOSIS — E785 Hyperlipidemia, unspecified: Secondary | ICD-10-CM

## 2016-07-29 DIAGNOSIS — I10 Essential (primary) hypertension: Secondary | ICD-10-CM

## 2016-07-29 DIAGNOSIS — Z952 Presence of prosthetic heart valve: Secondary | ICD-10-CM

## 2016-07-29 DIAGNOSIS — G4733 Obstructive sleep apnea (adult) (pediatric): Secondary | ICD-10-CM

## 2016-07-29 LAB — CBC
HEMATOCRIT: 47.1 % (ref 38.5–50.0)
HEMOGLOBIN: 15.9 g/dL (ref 13.2–17.1)
MCH: 33.5 pg — ABNORMAL HIGH (ref 27.0–33.0)
MCHC: 33.8 g/dL (ref 32.0–36.0)
MCV: 99.4 fL (ref 80.0–100.0)
MPV: 12 fL (ref 7.5–12.5)
Platelets: 177 10*3/uL (ref 140–400)
RBC: 4.74 MIL/uL (ref 4.20–5.80)
RDW: 14.2 % (ref 11.0–15.0)
WBC: 6.7 10*3/uL (ref 3.8–10.8)

## 2016-07-29 LAB — COMPREHENSIVE METABOLIC PANEL
ALBUMIN: 4.5 g/dL (ref 3.6–5.1)
ALK PHOS: 94 U/L (ref 40–115)
ALT: 64 U/L — AB (ref 9–46)
AST: 57 U/L — AB (ref 10–35)
BILIRUBIN TOTAL: 0.8 mg/dL (ref 0.2–1.2)
BUN: 22 mg/dL (ref 7–25)
CALCIUM: 9.4 mg/dL (ref 8.6–10.3)
CO2: 33 mmol/L — AB (ref 20–31)
Chloride: 98 mmol/L (ref 98–110)
Creat: 1.38 mg/dL — ABNORMAL HIGH (ref 0.70–1.18)
Glucose, Bld: 96 mg/dL (ref 65–99)
POTASSIUM: 4.8 mmol/L (ref 3.5–5.3)
Sodium: 138 mmol/L (ref 135–146)
Total Protein: 7.2 g/dL (ref 6.1–8.1)

## 2016-07-29 LAB — LIPID PANEL
CHOL/HDL RATIO: 4.6 ratio (ref <=5.0)
Cholesterol: 157 mg/dL (ref 125–200)
HDL: 34 mg/dL — AB (ref >=40)
LDL Cholesterol: 80 mg/dL (ref <130)
TRIGLYCERIDES: 217 mg/dL — AB (ref <150)
VLDL: 43 mg/dL — ABNORMAL HIGH (ref <30)

## 2016-07-29 NOTE — Patient Instructions (Signed)
Your physician has requested that you have an echocardiogram. Echocardiography is a painless test that uses sound waves to create images of your heart. It provides your doctor with information about the size and shape of your heart and how well your heart's chambers and valves are working. This procedure takes approximately one hour. There are no restrictions for this procedure.  Your physician recommends that you return for lab work TODAY.  Your physician has requested that you have an abdominal aorta duplex. During this test, an ultrasound is used to evaluate the aorta. Allow 30 minutes for this exam. Do not eat after midnight the day before and avoid carbonated beverages  Your physician wants you to follow-up in:6 months or sooner if needed. You will receive a reminder letter in the mail two months in advance. If you don't receive a letter, please call our office to schedule the follow-up appointment.

## 2016-07-30 LAB — TSH: TSH: 3.3 mIU/L (ref 0.40–4.50)

## 2016-07-31 NOTE — Progress Notes (Signed)
Cardiology Office Note    Date:  07/31/2016   ID:  Kenneth Scott, DOB November 14, 1946, MRN [id number]  PCP:  Sherrie Mustache, MD  Cardiologist:  Shelva Majestic, MD   Chief Complaint  Patient presents with  . Follow-up    no chest pain, has edema in legs, has ocassional dizziness    History of Present Illness:  Kenneth Scott is a 70 y.o. male who has known CAD and suffered an anterior wall myocardial infarction treated with acute PTCA of his LAD for myocardial salvage in August 2005. Due to significant multivessel coronary artery disease his LAD was not stented and he underwent elective CABG surgery on 07/29/2004 by Dr. Cyndia Bent with a LIMA placed to the LAD, vein to the diagonal, vein to the marginal, vein to the second diagonal, and vein to the PDA and PLA vessels. Because of significant aneurysmal dilatation of the ascending aorta he also underwent aortic root resection with aortic valve replacement using a 25 mm homograft and reimplantation of his coronary arteries. Additional problems include a history of TIA, hypertension, mixed hyperlipidemia, a history of DVT with subsequent pulmonary embolism/infarction in 2010.  He is on chronic Coumadin anticoagulation, and has a history of gout.   He has a history of hyperlipidemia with an atherogenic dyslipidemia pattern with triglycerides of 307 VLDL 61 despite an LDL cholesterol of 57.  In April 2014, and aortic ultrasound demonstrated a 3.5x3.4 cm distal abdominal aortic aneurysm just proximal to the iliac bifurcation this had not changed from prior study of June 2013. On 04/23/2013, a nuclear perfusion study revealed abnormality involving the apex which is fixed and most consistent with his previous infarction involving the antral apical septum. There was suggestion of mild reversibility in the inferior inferolateral and lateral these have not been significantly changed from his prior studies.  He was admitted to [facility] when he  presented with acute gout on 08/30/2013 and while in the emergency room he was noted to have a low PO2 of approximately 50 and was admitted for observation. He was treated with colchicine and was given a dose of steroids in the emergency room. His hypoxia did improve. However, at night he was noted to have significant nocturnal hypoxia and was placed on 2 L nasal cannula his cardiac enzymes were negative. While hospitalized he did have venous duplex imaging which was negative for bilateral lower extremity DVT. He was advised to have an echo Doppler study.  I saw him following his hospitalization and was concerned that obstructive sleep apnea  was contributory to his nocturnal oxygen desaturation. An echo Doppler study showed an ejection fraction of 50-55% with moderate hypokinesis. He had mild mitral regurgitation. Pulmonary artery pressure was 31 mm; other valvular structures were normal.   He underwent a sleep study and due to severe sleep apnea this was a split night protocol. The baseline portion confirmed severe sleep apnea with an AHI of 41.4 per hour but REM sleep this was 60 per hour. He dropped his oxygen saturation to 78% in non-REM sleep and 73% with REM sleep and had loud snoring. He was titrated with CPAP up to 11 cm with excellent result. When I saw him in December 2014 he had not been consistently using his CPAP therapy.  Since that time, his has significantly improved usage. He is set at a CPAP pressure of 11 cm with a S 9 Elite Resmed CPAP unit.  He now admits to good sleep.  However, at times, he  still falls asleep on a recliner before going to bed.  A follow-up echo Doppler study on 06/15/2015 revealed an ejection fraction of 45-50%.  There was akinesis of the mid anteroseptal and distal septal walls.  No mention was made in the report of his aortic valve replacement.   Since I last saw him, he continues to do well.  He denies chest pain or shortness of breath.  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 He denies any chest pain. He has been on Avalide 150/12.5 and furosemide 20 mg in addition to Toprol-XL 37.5 mg for blood pressure. He is on warfarin anticoagulation.  He is on Crestor for hyperlipidemia and continues to take allupurinol and colchicine seen for his gout history.    Past Medical History:  Diagnosis Date  . AAA (abdominal aortic aneurysm) ([place]) 03/04/13   aortic duplex dopler- when compared tp previous study ther is no change. aneurysmal dilation measuring 3.5 x 3.4 cemtimeters  . Anxiety   . Coronary artery disease 04/23/13   NUC STRESS TEST-EF 52% High risk scan two separate perfusion defects and a suggestion of reveribility in the inferior-inferolateral wall.  . Hyperlipidemia   . Hypertension 09/06/12   ECHO-EF 45%   . PE (pulmonary embolism)   . TIA (transient ischemic attack)     Past Surgical History:  Procedure Laterality Date  . AORTIC VALVE REPLACEMENT     Using a 25 mm homograft with reimplanation of his coronary arteries  . CORONARY ARTERY BYPASS GRAFT  2005   By Dr Cyndia Bent, He had a LIMA placed to the LAD, a vien to the diagonal, a vein to the marginal, a vein to the 2nd diagonal, a vein  to the PDA, PLA vessel.    Current Medications: Outpatient Medications Prior to Visit  Medication Sig Dispense Refill  . aspirin 81 MG tablet Take 81 mg by mouth daily.    . colchicine 0.6 MG tablet Take 0.6 mg by mouth daily. Takes PRN    . escitalopram (LEXAPRO) 10 MG tablet Take 10 mg by mouth daily. Takes 1/2 tablet daily    . furosemide (LASIX) 20 MG tablet TAKE 1 TABLET EVERY DAY 30 tablet 5  . irbesartan-hydrochlorothiazide (AVALIDE) 150-12.5 MG per tablet Take 1 tablet by mouth as needed.    . metoprolol succinate (TOPROL-XL) 25 MG 24 hr tablet Take 25 mg by mouth daily.    Marland Kitchen NITROSTAT 0.4 MG SL tablet PLACE 1 TABLET (0.4 MG TOTAL) UNDER THE TONGUE  EVERY 5 (FIVE) MINUTES AS NEEDED FOR CHEST PAIN. 25 tablet 1  . Omega-3 Krill Oil 300 MG CAPS Take 1 capsule by mouth daily.    . predniSONE (DELTASONE) 10 MG tablet as needed.     . warfarin (COUMADIN) 5 MG tablet Take 5 mg by mouth daily. As directed per INR    . allopurinol (ZYLOPRIM) 100 MG tablet Take 2 tablets by mouth daily.    . rosuvastatin (CRESTOR) 5 MG tablet Take 5 mg by mouth daily.     No facility-administered medications prior to visit.      Allergies:   Review of patient's allergies indicates no known allergies.   Social History   Social History  . Marital status: Divorced    Spouse name: N/A  . Number of children: 2  . Years of education: N/A   Social History Main  Topics  . Smoking status: Never Smoker  . Smokeless tobacco: Never Used  . Alcohol use No  . Drug use: No  . Sexual activity: Not Asked   Other Topics Concern  . None   Social History Narrative  . None    Social  history is notable that he is married for 35 years.  He has 2 children and 2 grandchildren.  He completed 6 grade.  He is self-employed Academic librarian.   Family History:  The patient's family history includes Cancer in his sister; Heart disease in his father and mother.   ROS General: Negative; No fevers, chills, or night sweats;  HEENT: Negative; No changes in vision or hearing, sinus congestion, difficulty swallowing Pulmonary: Negative; No cough, wheezing, shortness of breath, hemoptysis Cardiovascular: Negative; No chest pain, presyncope, syncope, palpitations GI: Negative; No nausea, vomiting, diarrhea, or abdominal pain GU: Negative; No dysuria, hematuria, or difficulty voiding Musculoskeletal: Negative; no myalgias, joint pain, or weakness Hematologic/Oncology: Negative; no easy bruising, bleeding Endocrine: Negative; no heat/cold intolerance; no diabetes Neuro: Negative; no changes in balance, headaches Skin: Negative; No rashes or skin lesions Psychiatric: Negative; No  behavioral problems, depression Sleep: Negative; No snoring, daytime sleepiness, hypersomnolence, bruxism, restless legs, hypnogognic hallucinations, no cataplexy Other comprehensive 14 point system review is negative.   PHYSICAL EXAM:   VS:  BP 98/70 (BP Location: Left Arm)   Pulse (!) 47   Ht _0  (1.803 m)   Wt 213 lb 4 oz (96.7 kg)   BMI 29.74 kg/m    Wt Readings from Last 3 Encounters:  07/29/16 213 lb 4 oz (96.7 kg)  08/14/15 217 lb (98.4 kg)  12/31/14 221 lb 8 oz (100.5 kg)    General: Alert, oriented, no distress.  Skin: normal turgor, no rashes HEENT: Normocephalic, atraumatic. Pupils round and reactive; sclera anicteric;no lid lag.  Nose without nasal septal hypertrophy Mouth/Parynx benign; Mallinpatti scale 3 Neck: No JVD, no carotid bruits .  Normal carotid upstroke Lungs: clear to ausculatation and percussion; no wheezing or rales Chest wall: Nontender to palpation Heart: RRR, s1 s2 normal 2/6 systolic murmur; no AR.  No diastolic murmur, rubs, thrills or heaves Abdomen: soft, nontender; no hepatosplenomehaly, BS+; abdominal aorta nontender and not dilated by palpation. Back: No CVA tenderness Pulses 2+ Extremities: Trivial ankle edema, no clubbing cyanosis , Homan's sign negative  Neurologic: grossly nonfocal Psychological: Normal affect and mood    Studies/Labs Reviewed:   ECG (independently read by me): SB at 47; normal intervals.  07/2016 ECG (independently read by me):  Sinus bradycardia at 55 bpm with sinus arrhythmia.  No significant ST changes.  February 2000 16ECG (independently read by me): Sinus bradycardia 52 bpm.  Mild T wave abnormality in aVL.  Otherwise no significant abnormalities.  10/29/13 ECG: sinus rhythm with PACs.   Recent Labs: BMP Latest Ref Rng & Units 07/29/2016 08/14/2015 09/04/2013  Glucose 65 - 99 mg/dL 96 85 77  BUN 7 - 25 mg/dL 22 27(H) 27(H)  Creatinine 0.70 - 1.18 mg/dL 1.38(H) 1.32(H) 1.17  Sodium 135 - 146 mmol/L 138  145 146(H)  Potassium 3.5 - 5.3 mmol/L 4.8 3.8 4.3  Chloride 98 - 110 mmol/L 98 104 105  CO2 20 - 31 mmol/L 33(H) 30 30  Calcium 8.6 - 10.3 mg/dL 9.4 9.4 9.5     Hepatic Function Latest Ref Rng & Units 07/29/2016 08/14/2015 09/04/2013  Total Protein 6.1 - 8.1 g/dL 7.2 6.9 6.9  Albumin 3.6 - 5.1 g/dL 4.5 4.6  3.8  AST 10 - 35 U/L 57(H) 37(H) 32  ALT 9 - 46 U/L 64(H) 44 41  Alk Phosphatase 40 - 115 U/L 94 99 103  Total Bilirubin 0.2 - 1.2 mg/dL 0.8 0.8 0.9    CBC Latest Ref Rng & Units 07/29/2016 08/14/2015 05/21/2013  WBC 3.8 - 10.8 K/uL 6.7 7.2 7.0  Hemoglobin 13.2 - 17.1 g/dL 15.9 15.5 14.4  Hematocrit 38.5 - 50.0 % 47.1 44.9 41.8  Platelets 140 - 400 K/uL 177 163 183   Lab Results  Component Value Date   MCV 99.4 07/29/2016   MCV 99.1 08/14/2015   MCV 93.9 05/21/2013   Lab Results  Component Value Date   TSH 3.30 07/29/2016   Lab Results  Component Value Date   HGBA1C  04/06/2009    5.4 (NOTE) The ADA recommends the following therapeutic goal for glycemic control related to Hgb A1c measurement: Goal of therapy: <6.5 Hgb A1c  Reference: American Diabetes Association: Clinical Practice Recommendations 2010, Diabetes Care, 2010, 33: (Suppl  1).     BNP No results found for: BNP  ProBNP No results found for: PROBNP   Lipid Panel     Component Value Date/Time   CHOL 157 07/29/2016 0845   TRIG 217 (H) 07/29/2016 0845   HDL 34 (L) 07/29/2016 0845   CHOLHDL 4.6 07/29/2016 0845   VLDL 43 (H) 07/29/2016 0845   LDLCALC 80 07/29/2016 0845     RADIOLOGY: No results found.      ASSESSMENT:    1. CAD in native artery   2. Essential hypertension   3. 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 Hyperlipemia   6. Abdominal aortic aneurysm (AAA) without rupture ([place])   7.     Obstructive sleep apnea   PLAN:  Kenneth Scott is a 70 year old Caucasian male who is 12 years status post his anterior wall myocardial infarction and PTCA of his LAD. He derived significant  myocardial salvage ultimately underwent elective CABG revascularization surgery for severe multivessel CAD. At that time, due to the significant asscending aortic aneurysmal dilatation he required a homograph aortic valve replacement and reimplantation of his coronary arteries. He has a documented moderate abdominal aortic aneurysm which is unchanged from previously.His last nuclear perfusion study in May 2014 revealed 2 areas of abnormality not significantly changed from previously.  His blood pressure Is somewhat low and he is bradycardic.  He has been on Toprol-XL 37.5 mg, irbesartan HCT 150/12.5, and also has been taking furosemide 20 mg.  I have suggested that he reduce his Toprol to 25 mg.  He will change is furosemide to as needed.   He is on Crestor 5 mg daily for hyperlipidemia.  He was fasting today, repeat blood work was checked.  He is on Coumadin anticoagulation, which is in his INR is followed by Dr. Edrick Oh.   He continues to be on Allopurinol and colchicine for gout and has not had recent recurrence.   He continues to use CPAP with 100% compliance and is unaware of any breakthrough snoring.  He denies residual daytime sleepiness.  Have recommended that he undergo a follow-up echo Doppler study for reevaluation of his AVR.  He will also be scheduled for follow-up abdominal aortic aneurysm assessment.   I will see him in 6 months for reevaluation or sooner if problems arise.   Time spent: 25 minutes   Medication Adjustments/Labs and Tests Ordered: Current medicines are reviewed at length with the patient  today.  Concerns regarding medicines are outlined above.  Medication changes, Labs and Tests ordered today are listed in the Patient Instructions below. Patient Instructions  Your physician has requested that you have an echocardiogram. Echocardiography is a painless test that uses sound waves to create images of your heart. It provides your doctor with information about the size and shape of  your heart and how well your heart's chambers and valves are working. This procedure takes approximately one hour. There are no restrictions for this procedure.  Your physician recommends that you return for lab work TODAY.  Your physician has requested that you have an abdominal aorta duplex. During this test, an ultrasound is used to evaluate the aorta. Allow 30 minutes for this exam. Do not eat after midnight the day before and avoid carbonated beverages  Your physician wants you to follow-up in:6 months or sooner if needed. You will receive a reminder letter in the mail two months in advance. If you don't receive a letter, please call our office to schedule the follow-up appointment.     Signed, Shelva Majestic, MD, [facility]  07/31/2016 12:24 PM    [place] [address] [place], [place], [place]  [address] Phone: [phone]

## 2016-08-10 ENCOUNTER — Ambulatory Visit (HOSPITAL_COMMUNITY)
Admission: RE | Admit: 2016-08-10 | Discharge: 2016-08-10 | Disposition: A | Discharge status: Home / Self Care | Payer: Medicare Other | Source: Ambulatory Visit | Attending: Cardiovascular Disease | Admitting: Cardiovascular Disease

## 2016-08-10 DIAGNOSIS — I708 Atherosclerosis of other arteries: Secondary | ICD-10-CM | POA: Diagnosis not present

## 2016-08-10 DIAGNOSIS — I700 Atherosclerosis of aorta: Secondary | ICD-10-CM | POA: Diagnosis not present

## 2016-08-10 DIAGNOSIS — Z8673 Personal history of transient ischemic attack (TIA), and cerebral infarction without residual deficits: Secondary | ICD-10-CM | POA: Insufficient documentation

## 2016-08-10 DIAGNOSIS — I714 Abdominal aortic aneurysm, without rupture, unspecified: Secondary | ICD-10-CM

## 2016-08-10 DIAGNOSIS — I10 Essential (primary) hypertension: Secondary | ICD-10-CM | POA: Diagnosis not present

## 2016-08-10 DIAGNOSIS — I2510 Atherosclerotic heart disease of native coronary artery without angina pectoris: Secondary | ICD-10-CM | POA: Insufficient documentation

## 2016-08-10 DIAGNOSIS — E785 Hyperlipidemia, unspecified: Secondary | ICD-10-CM | POA: Insufficient documentation

## 2016-08-12 ENCOUNTER — Other Ambulatory Visit: Payer: Self-pay

## 2016-08-12 ENCOUNTER — Ambulatory Visit (HOSPITAL_COMMUNITY): Payer: Medicare Other | Attending: Cardiology

## 2016-08-12 DIAGNOSIS — Z951 Presence of aortocoronary bypass graft: Secondary | ICD-10-CM | POA: Insufficient documentation

## 2016-08-12 DIAGNOSIS — Z954 Presence of other heart-valve replacement: Secondary | ICD-10-CM | POA: Diagnosis not present

## 2016-08-12 DIAGNOSIS — I517 Cardiomegaly: Secondary | ICD-10-CM | POA: Insufficient documentation

## 2016-08-12 DIAGNOSIS — Z952 Presence of prosthetic heart valve: Secondary | ICD-10-CM

## 2016-08-12 DIAGNOSIS — I5030 Unspecified diastolic (congestive) heart failure: Secondary | ICD-10-CM | POA: Insufficient documentation

## 2016-08-17 ENCOUNTER — Encounter: Payer: Self-pay | Admitting: *Deleted

## 2016-09-28 ENCOUNTER — Other Ambulatory Visit: Payer: Self-pay | Admitting: *Deleted

## 2016-09-28 ENCOUNTER — Encounter: Payer: Self-pay | Admitting: *Deleted

## 2016-09-28 DIAGNOSIS — R7989 Other specified abnormal findings of blood chemistry: Secondary | ICD-10-CM

## 2016-09-28 DIAGNOSIS — R945 Abnormal results of liver function studies: Principal | ICD-10-CM

## 2017-03-17 ENCOUNTER — Other Ambulatory Visit: Payer: Self-pay | Admitting: Cardiovascular Disease

## 2017-03-17 DIAGNOSIS — I714 Abdominal aortic aneurysm, without rupture, unspecified: Secondary | ICD-10-CM

## 2017-03-22 ENCOUNTER — Ambulatory Visit (HOSPITAL_COMMUNITY)
Admission: RE | Admit: 2017-03-22 | Discharge: 2017-03-22 | Disposition: A | Discharge status: Home / Self Care | Payer: Medicare Other | Source: Ambulatory Visit | Attending: Cardiovascular Disease | Admitting: Cardiovascular Disease

## 2017-03-22 DIAGNOSIS — I714 Abdominal aortic aneurysm, without rupture, unspecified: Secondary | ICD-10-CM

## 2017-03-22 DIAGNOSIS — I700 Atherosclerosis of aorta: Secondary | ICD-10-CM | POA: Diagnosis not present

## 2017-04-11 ENCOUNTER — Encounter: Payer: Self-pay | Admitting: *Deleted

## 2017-04-25 ENCOUNTER — Encounter: Payer: Self-pay | Admitting: Cardiovascular Disease

## 2017-04-25 ENCOUNTER — Ambulatory Visit (INDEPENDENT_AMBULATORY_CARE_PROVIDER_SITE_OTHER): Payer: Medicare Other | Admitting: Cardiovascular Disease

## 2017-04-25 VITALS — BP 110/78 | HR 49 | Ht 71.0 in | Wt 215.0 lb

## 2017-04-25 DIAGNOSIS — I2510 Atherosclerotic heart disease of native coronary artery without angina pectoris: Secondary | ICD-10-CM | POA: Diagnosis not present

## 2017-04-25 DIAGNOSIS — I714 Abdominal aortic aneurysm, without rupture, unspecified: Secondary | ICD-10-CM

## 2017-04-25 DIAGNOSIS — E782 Mixed hyperlipidemia: Secondary | ICD-10-CM

## 2017-04-25 DIAGNOSIS — Q2543 Congenital aneurysm of aorta: Secondary | ICD-10-CM

## 2017-04-25 DIAGNOSIS — Z952 Presence of prosthetic heart valve: Secondary | ICD-10-CM | POA: Diagnosis not present

## 2017-04-25 DIAGNOSIS — I10 Essential (primary) hypertension: Secondary | ICD-10-CM | POA: Diagnosis not present

## 2017-04-25 DIAGNOSIS — I708 Atherosclerosis of other arteries: Secondary | ICD-10-CM

## 2017-04-25 DIAGNOSIS — I719 Aortic aneurysm of unspecified site, without rupture: Secondary | ICD-10-CM | POA: Diagnosis not present

## 2017-04-25 DIAGNOSIS — I700 Atherosclerosis of aorta: Secondary | ICD-10-CM

## 2017-04-25 DIAGNOSIS — I723 Aneurysm of iliac artery: Secondary | ICD-10-CM

## 2017-04-25 DIAGNOSIS — Z7901 Long term (current) use of anticoagulants: Secondary | ICD-10-CM

## 2017-04-25 DIAGNOSIS — G4733 Obstructive sleep apnea (adult) (pediatric): Secondary | ICD-10-CM

## 2017-04-25 DIAGNOSIS — I7121 Aneurysm of the ascending aorta, without rupture: Secondary | ICD-10-CM

## 2017-04-25 NOTE — Patient Instructions (Signed)
Your physician wants you to follow-up in: 6 months or sooner if needed. You will receive a reminder letter in the mail two months in advance. If you don't receive a letter, please call our office to schedule the follow-up appointment.   If you need a refill on your cardiac medications before your next appointment, please call your pharmacy. 

## 2017-04-25 NOTE — Progress Notes (Signed)
Cardiology Office Note    Date:  04/27/2017   ID:  Kenneth Scott, Kenneth Scott [birth year], MRN [id number]  PCP:  Dione Housekeeper, MD  Cardiologist:  Shelva Majestic, MD   Chief Complaint  Patient presents with  . Follow-up    6 months  . Chest Pain    At times.  . Shortness of Breath    Ankles.    History of Present Illness:  Kenneth Scott is a 70 y.o. male who presents for an 8 month follow-up cardiology evaluation.    Kenneth Scott has known CAD and suffered an anterior wall myocardial infarction treated with acute PTCA of his LAD for myocardial salvage in August 2005. Due to significant multivessel coronary artery disease his LAD was not stented and he underwent elective CABG surgery on 07/29/2004 by Dr. Cyndia Bent with a LIMA placed to the LAD, vein to the diagonal, vein to the marginal, vein to the second diagonal, and vein to the PDA and PLA vessels. Because of significant aneurysmal dilatation of the ascending aorta he also underwent aortic root resection with aortic valve replacement using a 25 mm homograft and reimplantation of his coronary arteries. Additional problems include a history of TIA, hypertension, mixed hyperlipidemia, a history of DVT with subsequent pulmonary embolism/infarction in 2010.  He is on chronic Coumadin anticoagulation, and has a history of gout.   He has a history of hyperlipidemia with an atherogenic dyslipidemia pattern with triglycerides of 307 VLDL 61 despite an LDL cholesterol of 57.  In April 2014, and aortic ultrasound demonstrated a 3.5x3.4 cm distal abdominal aortic aneurysm just proximal to the iliac bifurcation this had not changed from prior study of June 2013. On 04/23/2013, a nuclear perfusion study revealed abnormality involving the apex which is fixed and most consistent with his previous infarction involving the antral apical septum. There was suggestion of mild reversibility in the inferior inferolateral and lateral these have not been  significantly changed from his prior studies.  He was admitted to [facility] when he presented with acute gout on 08/30/2013 and while in the emergency room he was noted to have a low PO2 of approximately 50 and was admitted for observation. He was treated with colchicine and was given a dose of steroids in the emergency room. His hypoxia did improve. However, at night he was noted to have significant nocturnal hypoxia and was placed on 2 L nasal cannula his cardiac enzymes were negative. While hospitalized he did have venous duplex imaging which was negative for bilateral lower extremity DVT. He was advised to have an echo Doppler study.  I saw him following his hospitalization and was concerned that obstructive sleep apnea  was contributory to his nocturnal oxygen desaturation. An echo Doppler study showed an ejection fraction of 50-55% with moderate hypokinesis. He had mild mitral regurgitation. Pulmonary artery pressure was 31 mm; other valvular structures were normal.   He underwent a sleep study and due to severe sleep apnea this was a split night protocol. The baseline portion confirmed severe sleep apnea with an AHI of 41.4 per hour but REM sleep this was 60 per hour. He dropped his oxygen saturation to 78% in non-REM sleep and 73% with REM sleep and had loud snoring. He was titrated with CPAP up to 11 cm with excellent result. When I saw him in December 2014 he had not been consistently using his CPAP therapy.  Since that time, his has significantly improved usage. He is set at a CPAP  pressure of 11 cm with a S 9 Elite Resmed CPAP unit.  He now admits to good sleep.  However, at times, he still falls asleep on a recliner before going to bed.  A follow-up echo Doppler study on 06/15/2015 revealed an ejection fraction of 45-50%.  There was akinesis of the mid anteroseptal and distal septal walls.  No mention was made in the report of his aortic valve replacement.   When I last saw him  in September 2017 he was continuing to do well.  An echo Doppler study on 08/12/2016 showed an EF of 50-55%.  There was mild LVH.  There was grade 2 diastolic dysfunction.  There was akinesis of the distal septum with overall preserved LV function and grade 2 diastolic dysfunction.  He status post aVR with mild AR and mild LAE.  An aortic root homograft was present.  Over the past 8 months, he denies any episodes of chest pain, or shortness of breath.  He continues to work long hours in [employer] and often may work 6-7 days per week.  He underwent a follow-up abdominal aortic ultrasound to assess his aortic aneurysm on 03/22/2017.  This showed a stable infrarenal fusiform aneurysm in the distal aorta measuring 4.3 x 4.2 cm.  There was a left common iliac artery aneurysm measuring 2.02.1 cm which has increased from prior study.  There was aortoiliac atherosclerosis without stenosis.  He had lab work done 1 month ago by his primary physician, Dr. Edrick Oh. He has been on Avalide 150/12.5 , but he states he has been cutting this in half, and furosemide one half of 20 mg on as-needed basis in addition to Toprol-XL 25 mg for blood pressure. He is on warfarin anticoagulation.  He is on Crestor for hyperlipidemia and continues to take allupurinol and colchicine seen for his gout history.  He presents for evaluation   Past Medical History:  Diagnosis Date  . AAA (abdominal aortic aneurysm) ([place]) 03/04/13   aortic duplex dopler- when compared tp previous study ther is no change. aneurysmal dilation measuring 3.5 x 3.4 cemtimeters  . Anxiety   . Coronary artery disease 04/23/13   NUC STRESS TEST-EF 52% High risk scan two separate perfusion defects and a suggestion of reveribility in the inferior-inferolateral wall.  . Hyperlipidemia   . Hypertension 09/06/12   ECHO-EF 45%   . PE (pulmonary embolism)   . TIA (transient ischemic attack)     Past Surgical History:  Procedure Laterality Date  . AORTIC  VALVE REPLACEMENT     Using a 25 mm homograft with reimplanation of his coronary arteries  . CORONARY ARTERY BYPASS GRAFT  2005   By Dr Cyndia Bent, He had a LIMA placed to the LAD, a vien to the diagonal, a vein to the marginal, a vein to the 2nd diagonal, a vein  to the PDA, PLA vessel.    Current Medications: Outpatient Medications Prior to Visit  Medication Sig Dispense Refill  . allopurinol (ZYLOPRIM) 300 MG tablet Take 1 tablet by mouth daily.  4  . aspirin 81 MG tablet Take 81 mg by mouth daily.    . colchicine 0.6 MG tablet Take 0.6 mg by mouth daily. Takes PRN    . escitalopram (LEXAPRO) 10 MG tablet Take 10 mg by mouth daily. Takes 1/2 tablet daily    . furosemide (LASIX) 20 MG tablet TAKE 1 TABLET EVERY DAY 30 tablet 5  . furosemide (LASIX) 40 MG tablet Take 1 tablet by mouth  daily as needed.  2  . irbesartan-hydrochlorothiazide (AVALIDE) 150-12.5 MG per tablet Take 1 tablet by mouth as needed.    . metoprolol succinate (TOPROL-XL) 25 MG 24 hr tablet Take 25 mg by mouth daily.    Marland Kitchen NITROSTAT 0.4 MG SL tablet PLACE 1 TABLET (0.4 MG TOTAL) UNDER THE TONGUE EVERY 5 (FIVE) MINUTES AS NEEDED FOR CHEST PAIN. 25 tablet 1  . Omega-3 Krill Oil 300 MG CAPS Take 1 capsule by mouth daily.    . pravastatin (PRAVACHOL) 20 MG tablet Take 1 tablet by mouth daily.  4  . predniSONE (DELTASONE) 10 MG tablet as needed.     . warfarin (COUMADIN) 5 MG tablet Take 5 mg by mouth daily. As directed per INR     No facility-administered medications prior to visit.      Allergies:   Patient has no known allergies.   Social History   Social History  . Marital status: Divorced    Spouse name: N/A  . Number of children: 2  . Years of education: N/A   Social History Main Topics  . Smoking status: Never Smoker  . Smokeless tobacco: Never Used  . Alcohol use No  . Drug use: No  . Sexual activity: Not Asked   Other Topics Concern  . None   Social History Narrative  . None    Social  history is  notable that he is married for 36 years.  He has 2 children and 2 grandchildren.  He completed 6 grade.  He is self-employed Academic librarian.   Family History:  The patient's family history includes Cancer in his sister; Heart disease in his father and mother.   ROS General: Negative; No fevers, chills, or night sweats;  HEENT: Negative; No changes in vision or hearing, sinus congestion, difficulty swallowing Pulmonary: Negative; No cough, wheezing, shortness of breath, hemoptysis Cardiovascular: Negative; No chest pain, presyncope, syncope, palpitations GI: Negative; No nausea, vomiting, diarrhea, or abdominal pain GU: Negative; No dysuria, hematuria, or difficulty voiding Musculoskeletal: Negative; no myalgias, joint pain, or weakness Hematologic/Oncology: Negative; no easy bruising, bleeding Endocrine: Negative; no heat/cold intolerance; no diabetes Neuro: Negative; no changes in balance, headaches Skin: Negative; No rashes or skin lesions Psychiatric: Negative; No behavioral problems, depression Sleep: Positive for OSA on CPAP therapy with 100% compliance; No snoring, daytime sleepiness, hypersomnolence, bruxism, restless legs, hypnogognic hallucinations, no cataplexy Other comprehensive 14 point system review is negative.   PHYSICAL EXAM:   VS:  BP 110/78   Pulse (!) 49   Ht _0  (1.803 m)   Wt 215 lb (97.5 kg)   BMI 29.99 kg/m     Wt Readings from Last 3 Encounters:  04/25/17 215 lb (97.5 kg)  07/29/16 213 lb 4 oz (96.7 kg)  08/14/15 217 lb (98.4 kg)    General: Alert, oriented, no distress.  Skin: normal turgor, no rashes, warm and dry HEENT: Normocephalic, atraumatic. Pupils equal round and reactive to light; sclera anicteric; extraocular muscles intact;  Nose without nasal septal hypertrophy Mouth/Parynx benign; Mallinpatti scale 3 Neck: No JVD, no carotid bruits; normal carotid upstroke Lungs: clear to ausculatation and percussion; no wheezing or rales Chest  wall: without tenderness to palpitation Heart: PMI not displaced, RRR, s1 s2 normal, 2/6 systolic murmur in the aortic area without aortic insufficiency, no rubs, gallops, thrills, or heaves Abdomen: soft, nontender; no hepatosplenomehaly, BS+; abdominal aorta nontender and not dilated by palpation. Back: no CVA tenderness Pulses 2+ Musculoskeletal: full range of motion, normal  strength, no joint deformities Extremities: no clubbing cyanosis or edema, Homan's sign negative  Neurologic: grossly nonfocal; Cranial nerves grossly wnl Psychologic: Normal mood and affec    Studies/Labs Reviewed:   ECG (independently read by me): Sinus bradycardia 49 bpm, sinus arrhythmia.  QTc interval 420 ms.  September 2017 ECG (independently read by me): SB at 47; normal intervals.  September 2016 ECG (independently read by me):  Sinus bradycardia at 55 bpm with sinus arrhythmia.  No significant ST changes.  February 2016 ECG (independently read by me): Sinus bradycardia 52 bpm.  Mild T wave abnormality in aVL.  Otherwise no significant abnormalities.  10/29/13 ECG: sinus rhythm with PACs.   Recent Labs: BMP Latest Ref Rng & Units 07/29/2016 08/14/2015 09/04/2013  Glucose 65 - 99 mg/dL 96 85 77  BUN 7 - 25 mg/dL 22 27(H) 27(H)  Creatinine 0.70 - 1.18 mg/dL 1.38(H) 1.32(H) 1.17  Sodium 135 - 146 mmol/L 138 145 146(H)  Potassium 3.5 - 5.3 mmol/L 4.8 3.8 4.3  Chloride 98 - 110 mmol/L 98 104 105  CO2 20 - 31 mmol/L 33(H) 30 30  Calcium 8.6 - 10.3 mg/dL 9.4 9.4 9.5     Hepatic Function Latest Ref Rng & Units 07/29/2016 08/14/2015 09/04/2013  Total Protein 6.1 - 8.1 g/dL 7.2 6.9 6.9  Albumin 3.6 - 5.1 g/dL 4.5 4.6 3.8  AST 10 - 35 U/L 57(H) 37(H) 32  ALT 9 - 46 U/L 64(H) 44 41  Alk Phosphatase 40 - 115 U/L 94 99 103  Total Bilirubin 0.2 - 1.2 mg/dL 0.8 0.8 0.9    CBC Latest Ref Rng & Units 07/29/2016 08/14/2015 05/21/2013  WBC 3.8 - 10.8 K/uL 6.7 7.2 7.0  Hemoglobin 13.2 - 17.1 g/dL 15.9 15.5 14.4    Hematocrit 38.5 - 50.0 % 47.1 44.9 41.8  Platelets 140 - 400 K/uL 177 163 183   Lab Results  Component Value Date   MCV 99.4 07/29/2016   MCV 99.1 08/14/2015   MCV 93.9 05/21/2013   Lab Results  Component Value Date   TSH 3.30 07/29/2016   Lab Results  Component Value Date   HGBA1C  04/06/2009    5.4 (NOTE) The ADA recommends the following therapeutic goal for glycemic control related to Hgb A1c measurement: Goal of therapy: <6.5 Hgb A1c  Reference: American Diabetes Association: Clinical Practice Recommendations 2010, Diabetes Care, 2010, 33: (Suppl  1).     BNP No results found for: BNP  ProBNP No results found for: PROBNP   Lipid Panel     Component Value Date/Time   CHOL 157 07/29/2016 0845   TRIG 217 (H) 07/29/2016 0845   HDL 34 (L) 07/29/2016 0845   CHOLHDL 4.6 07/29/2016 0845   VLDL 43 (H) 07/29/2016 0845   LDLCALC 80 07/29/2016 0845     RADIOLOGY: No results found.      ASSESSMENT:    1. CAD in native artery   2. Essential hypertension   3. 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 Abdominal aortic aneurysm (AAA) without rupture ([place])   6. OSA (obstructive sleep apnea)   7. Mixed hyperlipidemia   8. Warfarin anticoagulation   9. 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 Atherosclerosis of aortic bifurcation and common iliac arteries [facility])      PLAN:  Kenneth Scott is a 71 year old Caucasian male who is 13 years status post his anterior wall myocardial infarction and PTCA of his LAD. He derived significant  myocardial salvage ultimately underwent elective CABG revascularization surgery for severe multivessel CAD. At that time, due to the significant asscending aortic aneurysmal dilatation he required a homograph aortic valve replacement and reimplantation of his coronary arteries. He has a documented moderate abdominal aortic aneurysm which is unchanged from previously.His last nuclear perfusion study in May 2014 revealed 2 areas of  abnormality not significantly changed from previously. When I last saw him, his blood pressure was low and he was bradycardic.  I reduced his Toprol dose to 25 mg and changed his furosemide to as needed.  His blood pressure today is controlled on his current therapy and has only been taking one half of irbesartan HCT pill daily and furosemide 20 mg on a when necessary basis.  I reviewed his echo Doppler data from September 2017 which showed a stable aortic valve replacement and aortic root homograft.  He is mild LVH with grade 2 diastolic dysfunction and akinesis of the distal septum.  He is on warfarin anticoagulation with INR followed by Dr. Edrick Oh.  I reviewed his abdominal ultrasound which showed stable 4.3 x 4.2 cm aortic aneurysm, but this has increased over the past 5 years.  There was slight progression of his left common iliac artery aneurysm.  There is evidence for aortoiliac atherosclerosis.  He continues to be on lipid-lowering therapy and is now on pravastatin 20 mg daily with target LDL less than 70.  I will try to obtain the results of his recent blood work from his primary physician.  If LDL is increased titration of medication will be necessary.  He continues to use CPAP for his obstructive sleep apnea and admits to 100% compliance.  I will see him in 6 months for reevaluation  Time spent: 25 minutes   Medication Adjustments/Labs and Tests Ordered: Current medicines are reviewed at length with the patient today.  Concerns regarding medicines are outlined above.  Medication changes, Labs and Tests ordered today are listed in the Patient Instructions below. Patient Instructions  Your physician wants you to follow-up in: 6 months or sooner if needed. You will receive a reminder letter in the mail two months in advance. If you don't receive a letter, please call our office to schedule the follow-up appointment.  If you need a refill on your cardiac medications before your next appointment,  please call your pharmacy.      Signed, Shelva Majestic, MD, [facility]  04/27/2017 12:48 PM    [place] [address], [place], [place], [place]  [address] Phone: [phone]

## 2017-04-26 ENCOUNTER — Ambulatory Visit: Payer: Medicare Other | Admitting: Cardiovascular Disease

## 2017-10-08 ENCOUNTER — Other Ambulatory Visit: Payer: Self-pay | Admitting: Cardiovascular Disease

## 2017-10-09 NOTE — Telephone Encounter (Signed)
REFILL 

## 2017-10-16 ENCOUNTER — Other Ambulatory Visit: Payer: Self-pay

## 2017-10-16 MED ORDER — NITROGLYCERIN 0.4 MG SL SUBL: SUBLINGUAL_TABLET | SUBLINGUAL | 3 refills | Status: DC

## 2017-10-17 ENCOUNTER — Other Ambulatory Visit: Payer: Self-pay | Admitting: *Deleted

## 2017-10-17 MED ORDER — NITROGLYCERIN 0.4 MG SL SUBL: SUBLINGUAL_TABLET | SUBLINGUAL | 6 refills | Status: AC

## 2018-03-21 ENCOUNTER — Other Ambulatory Visit: Payer: Self-pay | Admitting: Cardiovascular Disease

## 2018-03-21 DIAGNOSIS — I714 Abdominal aortic aneurysm, without rupture, unspecified: Secondary | ICD-10-CM

## 2018-03-23 ENCOUNTER — Ambulatory Visit (HOSPITAL_COMMUNITY)
Admission: RE | Admit: 2018-03-23 | Discharge: 2018-03-23 | Disposition: A | Discharge status: Home / Self Care | Payer: Medicare Other | Source: Ambulatory Visit | Attending: Internal Medicine | Admitting: Internal Medicine

## 2018-03-23 DIAGNOSIS — I714 Abdominal aortic aneurysm, without rupture, unspecified: Secondary | ICD-10-CM

## 2018-04-09 ENCOUNTER — Other Ambulatory Visit: Payer: Self-pay | Admitting: *Deleted

## 2018-04-09 DIAGNOSIS — I714 Abdominal aortic aneurysm, without rupture, unspecified: Secondary | ICD-10-CM

## 2018-05-28 ENCOUNTER — Encounter: Payer: Self-pay | Admitting: Surgery

## 2018-05-28 ENCOUNTER — Ambulatory Visit (INDEPENDENT_AMBULATORY_CARE_PROVIDER_SITE_OTHER): Payer: Medicare Other | Admitting: Surgery

## 2018-05-28 ENCOUNTER — Other Ambulatory Visit: Payer: Self-pay

## 2018-05-28 VITALS — BP 132/75 | HR 44 | Temp 99.5°F | Resp 16 | Ht 71.0 in | Wt 215.0 lb

## 2018-05-28 DIAGNOSIS — I714 Abdominal aortic aneurysm, without rupture, unspecified: Secondary | ICD-10-CM

## 2018-05-28 NOTE — Progress Notes (Signed)
[facility] [place]  Patient name: Kenneth Scott MRN: [id number] DOB: 1946-01-23 Sex: male   REQUESTING PROVIDER:    Dr. Debara Pickett   REASON FOR CONSULT:    AAA  HISTORY OF PRESENT ILLNESS:   Kenneth Scott is a 72 y.o. male, who is referred for evaluation of an abdominal aortic aneurysm that measured 4.8 cm on ultrasound.  He is without abdominal pain.  Patient has a history of coronary artery disease, status post myocardial infarction with PCI of his LAD in 2005.  He later went on to CABG.  He had valve replacement with aortic root repair for a sending aortic aneurysm.  The patient has a history of DVT and pulmonary embolism in 2010.  He is on chronic anticoagulation.  He also has a history of a TIA.  His hypercholesterolemia is managed with a statin.  His hypertension is medically managed as well.  He is a non-smoker.  PAST MEDICAL HISTORY    Past Medical History:  Diagnosis Date  . AAA (abdominal aortic aneurysm) ([place]) 03/04/13   aortic duplex dopler- when compared tp previous study ther is no change. aneurysmal dilation measuring 3.5 x 3.4 cemtimeters  . Anxiety   . Coronary artery disease 04/23/13   NUC STRESS TEST-EF 52% High risk scan two separate perfusion defects and a suggestion of reveribility in the inferior-inferolateral wall.  . Hyperlipidemia   . Hypertension 09/06/12   ECHO-EF 45%   . PE (pulmonary embolism)   . TIA (transient ischemic attack)      FAMILY HISTORY   Family History  Problem Relation Age of Onset  . Heart disease Mother   . Heart disease Father   . Cancer Sister     SOCIAL HISTORY:   Social History   Socioeconomic History  . Marital status: Divorced    Spouse name: Not on file  . Number of children: 2  . Years of education: Not on file  . Highest education level: Not on file  Occupational History  . Not on file  Social Needs  . Financial resource strain: Not on file  .  Food insecurity:    Worry: Not on file    Inability: Not on file  . Transportation needs:    Medical: Not on file    Non-medical: Not on file  Tobacco Use  . Smoking status: Never Smoker  . Smokeless tobacco: Never Used  Substance and Sexual Activity  . Alcohol use: No  . Drug use: No  . Sexual activity: Not on file  Lifestyle  . Physical activity:    Days per week: Not on file    Minutes per session: Not on file  . Stress: Not on file  Relationships  . Social connections:    Talks on phone: Not on file    Gets together: Not on file    Attends religious service: Not on file    Active member of club or organization: Not on file    Attends meetings of clubs or organizations: Not on file    Relationship status: Not on file  . Intimate partner violence:    Fear of current or ex partner: Not on file    Emotionally abused: Not on file    Physically abused: Not on file    Forced sexual activity: Not on file  Other Topics Concern  . Not on file  Social History Narrative  . Not on file    ALLERGIES:    No  Known Allergies  CURRENT MEDICATIONS:    Current Outpatient Medications  Medication Sig Dispense Refill  . allopurinol (ZYLOPRIM) 300 MG tablet Take 1 tablet by mouth daily.  4  . aspirin 81 MG tablet Take 81 mg by mouth daily.    . colchicine 0.6 MG tablet Take 0.6 mg by mouth daily. Takes PRN    . escitalopram (LEXAPRO) 10 MG tablet Take 10 mg by mouth daily. Takes 1/2 tablet daily    . furosemide (LASIX) 20 MG tablet TAKE 1 TABLET EVERY DAY 30 tablet 5  . furosemide (LASIX) 40 MG tablet Take 1 tablet by mouth daily as needed.  2  . irbesartan-hydrochlorothiazide (AVALIDE) 150-12.5 MG per tablet Take 1 tablet by mouth as needed.    . metoprolol succinate (TOPROL-XL) 25 MG 24 hr tablet Take 25 mg by mouth daily.    . nitroGLYCERIN (NITROSTAT) 0.4 MG SL tablet PLACE 1 TABLET (0.4 MG TOTAL) UNDER THE TONGUE EVERY 5 (FIVE) MINUTES AS NEEDED FOR CHEST PAIN. 25 tablet 6  .  Omega-3 Krill Oil 300 MG CAPS Take 1 capsule by mouth daily.    . pravastatin (PRAVACHOL) 20 MG tablet Take 1 tablet by mouth daily.  4  . predniSONE (DELTASONE) 10 MG tablet as needed.     . warfarin (COUMADIN) 5 MG tablet Take 5 mg by mouth daily. As directed per INR     No current facility-administered medications for this visit.     REVIEW OF SYSTEMS:   [X]  denotes positive finding, [ ]  denotes negative finding Cardiac  Comments:  Chest pain or chest pressure:    Shortness of breath upon exertion:    Short of breath when lying flat:    Irregular heart rhythm:        Vascular    Pain in calf, thigh, or hip brought on by ambulation:    Pain in feet at night that wakes you up from your sleep:     Blood clot in your veins:    Leg swelling:         Pulmonary    Oxygen at home:    Productive cough:     Wheezing:         Neurologic    Sudden weakness in arms or legs:     Sudden numbness in arms or legs:     Sudden onset of difficulty speaking or slurred speech:    Temporary loss of vision in one eye:     Problems with dizziness:         Gastrointestinal    Blood in stool:      Vomited blood:         Genitourinary    Burning when urinating:     Blood in urine:        Psychiatric    Major depression:         Hematologic    Bleeding problems:    Problems with blood clotting too easily:        Skin    Rashes or ulcers:        Constitutional    Fever or chills:     PHYSICAL EXAM:   Vitals:   05/28/18 0917  BP: 132/75  Pulse: (!) 44  Resp: 16  Temp: 99.5 F (37.5 C)  TempSrc: Oral  SpO2: 92%  Weight: 215 lb (97.5 kg)  Height: 5\' 11"  (1.803 m)    GENERAL: The patient is a well-nourished male, in no acute distress. The  vital signs are documented above. CARDIAC: There is a regular rate and rhythm.  VASCULAR: No carotid bruits.  Pedal pulses are not palpable. PULMONARY: Nonlabored respirations ABDOMEN: Soft and non-tender with normal pitched bowel  sounds.  MUSCULOSKELETAL: There are no major deformities or cyanosis. NEUROLOGIC: No focal weakness or paresthesias are detected. SKIN: There are no ulcers or rashes noted. PSYCHIATRIC: The patient has a normal affect.  STUDIES:   I have reviewed his ultrasound which shows a 4.8 cm infrarenal abdominal aortic aneurysm  ASSESSMENT and PLAN   AAA: I discussed with the patient that the indications for repair would be a aneurysm greater than 5 cm.  He does not yet meet criteria.  I would like for him to come back with a CT angiogram in October so that I can review the anatomy of his aneurysm to see if he is a candidate for endovascular repair, and to also get a accurate diameter measurement.  When he returns I will also get preoperative studies to include carotid duplex, ABIs and lower extremity arterial evaluation.  All of his questions were answered today.   Annamarie Major, MD [facility] [phone] Pager [phone]

## 2018-06-04 ENCOUNTER — Other Ambulatory Visit: Payer: Self-pay

## 2018-06-04 DIAGNOSIS — I714 Abdominal aortic aneurysm, without rupture, unspecified: Secondary | ICD-10-CM

## 2018-06-04 DIAGNOSIS — I739 Peripheral vascular disease, unspecified: Secondary | ICD-10-CM

## 2018-06-04 DIAGNOSIS — I719 Aortic aneurysm of unspecified site, without rupture: Secondary | ICD-10-CM

## 2018-06-04 DIAGNOSIS — G459 Transient cerebral ischemic attack, unspecified: Secondary | ICD-10-CM

## 2018-06-04 DIAGNOSIS — I7121 Aneurysm of the ascending aorta, without rupture: Secondary | ICD-10-CM

## 2018-09-11 ENCOUNTER — Emergency Department (HOSPITAL_COMMUNITY)
Admission: EM | Admit: 2018-09-11 | Discharge: 2018-09-11 | Disposition: A | Discharge status: Home / Self Care | Payer: Medicare Other | Attending: Emergency Medicine | Admitting: Emergency Medicine

## 2018-09-11 ENCOUNTER — Encounter (HOSPITAL_COMMUNITY): Payer: Self-pay | Admitting: Emergency Medicine

## 2018-09-11 ENCOUNTER — Emergency Department (HOSPITAL_COMMUNITY): Payer: Medicare Other

## 2018-09-11 DIAGNOSIS — S0093XA Contusion of unspecified part of head, initial encounter: Secondary | ICD-10-CM

## 2018-09-11 DIAGNOSIS — Y9389 Activity, other specified: Secondary | ICD-10-CM | POA: Insufficient documentation

## 2018-09-11 DIAGNOSIS — Z951 Presence of aortocoronary bypass graft: Secondary | ICD-10-CM | POA: Diagnosis not present

## 2018-09-11 DIAGNOSIS — Y92410 Unspecified street and highway as the place of occurrence of the external cause: Secondary | ICD-10-CM | POA: Diagnosis not present

## 2018-09-11 DIAGNOSIS — Z79899 Other long term (current) drug therapy: Secondary | ICD-10-CM | POA: Insufficient documentation

## 2018-09-11 DIAGNOSIS — S0083XA Contusion of other part of head, initial encounter: Secondary | ICD-10-CM | POA: Diagnosis not present

## 2018-09-11 DIAGNOSIS — I2510 Atherosclerotic heart disease of native coronary artery without angina pectoris: Secondary | ICD-10-CM | POA: Insufficient documentation

## 2018-09-11 DIAGNOSIS — Z7982 Long term (current) use of aspirin: Secondary | ICD-10-CM | POA: Diagnosis not present

## 2018-09-11 DIAGNOSIS — Z7901 Long term (current) use of anticoagulants: Secondary | ICD-10-CM | POA: Diagnosis not present

## 2018-09-11 DIAGNOSIS — Y999 Unspecified external cause status: Secondary | ICD-10-CM | POA: Diagnosis not present

## 2018-09-11 DIAGNOSIS — S8002XA Contusion of left knee, initial encounter: Secondary | ICD-10-CM | POA: Insufficient documentation

## 2018-09-11 DIAGNOSIS — I10 Essential (primary) hypertension: Secondary | ICD-10-CM | POA: Insufficient documentation

## 2018-09-11 DIAGNOSIS — S8992XA Unspecified injury of left lower leg, initial encounter: Secondary | ICD-10-CM | POA: Diagnosis present

## 2018-09-11 LAB — PROTIME-INR
INR: 1.79
Prothrombin Time: 20.6 seconds — ABNORMAL HIGH (ref 11.4–15.2)

## 2018-09-11 NOTE — ED Notes (Signed)
Pt verbalized understanding of discharge paperwork. Discharged home with wife.

## 2018-09-11 NOTE — ED Notes (Signed)
Patient transported to CT 

## 2018-09-11 NOTE — Discharge Instructions (Addendum)
It was our pleasure to provide your ER care today - we hope that you feel better.  Ice/coldpack to sore, swollen knee.   Your coumadin level/INR is 1.8 today - contact your doctor today to see if they recommend making any adjustment to coumadin dose.   Return to ER if worse, new or severe pain, other concern.
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
## 2018-09-11 NOTE — ED Notes (Signed)
Pt verbalized understanding of discharge paperwork and follow-up care.  °
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
## 2018-09-11 NOTE — ED Notes (Signed)
Patient transported to X-ray 

## 2018-12-03 ENCOUNTER — Ambulatory Visit: Payer: Medicare Other | Admitting: Surgery

## 2018-12-11 ENCOUNTER — Ambulatory Visit
Admission: RE | Admit: 2018-12-11 | Discharge: 2018-12-11 | Disposition: A | Discharge status: Home / Self Care | Payer: Medicare Other | Source: Ambulatory Visit | Attending: Surgery | Admitting: Surgery

## 2018-12-11 DIAGNOSIS — I714 Abdominal aortic aneurysm, without rupture, unspecified: Secondary | ICD-10-CM

## 2018-12-11 DIAGNOSIS — I7121 Aneurysm of the ascending aorta, without rupture: Secondary | ICD-10-CM

## 2018-12-11 DIAGNOSIS — I719 Aortic aneurysm of unspecified site, without rupture: Secondary | ICD-10-CM

## 2018-12-11 MED ORDER — IOPAMIDOL (ISOVUE-370) INJECTION 76%
75.0000 mL | Freq: Once | INTRAVENOUS | Status: AC | PRN
  Administered 2018-12-11: 75 mL via INTRAVENOUS

## 2018-12-17 ENCOUNTER — Ambulatory Visit (HOSPITAL_COMMUNITY)
Admission: RE | Admit: 2018-12-17 | Discharge: 2018-12-17 | Disposition: A | Discharge status: Home / Self Care | Payer: Medicare Other | Source: Ambulatory Visit | Attending: Surgery | Admitting: Surgery

## 2018-12-17 ENCOUNTER — Ambulatory Visit (INDEPENDENT_AMBULATORY_CARE_PROVIDER_SITE_OTHER)
Admission: RE | Admit: 2018-12-17 | Discharge: 2018-12-17 | Disposition: A | Discharge status: Home / Self Care | Payer: Medicare Other | Source: Ambulatory Visit | Attending: Surgery | Admitting: Surgery

## 2018-12-17 ENCOUNTER — Encounter: Payer: Self-pay | Admitting: *Deleted

## 2018-12-17 ENCOUNTER — Encounter: Payer: Self-pay | Admitting: Surgery

## 2018-12-17 ENCOUNTER — Ambulatory Visit (INDEPENDENT_AMBULATORY_CARE_PROVIDER_SITE_OTHER): Payer: Medicare Other | Admitting: Surgery

## 2018-12-17 ENCOUNTER — Telehealth: Payer: Self-pay | Admitting: *Deleted

## 2018-12-17 ENCOUNTER — Other Ambulatory Visit: Payer: Self-pay

## 2018-12-17 VITALS — BP 139/69 | HR 45 | Temp 97.6°F | Resp 16 | Ht 71.0 in | Wt 215.0 lb

## 2018-12-17 DIAGNOSIS — I739 Peripheral vascular disease, unspecified: Secondary | ICD-10-CM | POA: Insufficient documentation

## 2018-12-17 DIAGNOSIS — I714 Abdominal aortic aneurysm, without rupture, unspecified: Secondary | ICD-10-CM

## 2018-12-17 DIAGNOSIS — G459 Transient cerebral ischemic attack, unspecified: Secondary | ICD-10-CM | POA: Diagnosis not present

## 2018-12-17 NOTE — Telephone Encounter (Signed)
Request for Surgical Clearance  1. What type of surgery is being performed?  ENDOVASCULAR AORTIC STENT GRAFT     2. When is this surgery scheduled? 01/02/2019  3. What type of clearance is required (medical clearance vs. Pharmacy clearance to hold med vs. Both)? CARDIAC CLEARANCE AND PHARMACY CLEARANCE     4. Are there any medications that need to be held prior to surgery and how long?  COUMADIN X 5 DAYS     5. Practice name and name of physician performing surgery? DR. Trula Slade   [facility]    6.  What is your office phone number? [phone]    7. What is your office fax number? (Be sure to include anyone who it needs to go Attn to) [phone] ATTN. [email or web address]    8. Anesthesia type (None, local, MAC, general)? GENERAL  REMINDER TO USER: Remember to please route this message to P [facility] PREOP in a phone note.

## 2018-12-17 NOTE — Progress Notes (Signed)
[facility]  Patient name: Kenneth Scott MRN: [id number] DOB: 1946-08-31 Sex: male   REASON FOR VISIT:    Follow up  HISOTRY OF PRESENT ILLNESS:   Kenneth Scott is a 73 y.o. male, who is referred for evaluation of an abdominal aortic aneurysm that measured 4.8 cm on ultrasound.  He is without abdominal pain.  Patient has a history of coronary artery disease, status post myocardial infarction with PCI of his LAD in 2005.  He later went on to CABG.  He had valve replacement with aortic root repair for a sending aortic aneurysm.  The patient has a history of DVT and pulmonary embolism in 2010.  He is on chronic anticoagulation.  He also has a history of a TIA.  His hypercholesterolemia is managed with a statin.  His hypertension is medically managed as well.  He is a non-smoker.   PAST MEDICAL HISTORY:   Past Medical History:  Diagnosis Date  . AAA (abdominal aortic aneurysm) ([place]) 03/04/13   aortic duplex dopler- when compared tp previous study ther is no change. aneurysmal dilation measuring 3.5 x 3.4 cemtimeters  . Anxiety   . Coronary artery disease 04/23/13   NUC STRESS TEST-EF 52% High risk scan two separate perfusion defects and a suggestion of reveribility in the inferior-inferolateral wall.  . Hyperlipidemia   . Hypertension 09/06/12   ECHO-EF 45%   . PE (pulmonary embolism)   . TIA (transient ischemic attack)      FAMILY HISTORY:   Family History  Problem Relation Age of Onset  . Heart disease Mother   . Heart disease Father   . Cancer Sister     SOCIAL HISTORY:   Social History   Tobacco Use  . Smoking status: Never Smoker  . Smokeless tobacco: Never Used  Substance Use Topics  . Alcohol use: No     ALLERGIES:   No Known Allergies   CURRENT MEDICATIONS:   Current Outpatient Medications  Medication Sig Dispense Refill  . allopurinol (ZYLOPRIM) 300 MG tablet Take 1 tablet by  mouth daily.  4  . aspirin 81 MG tablet Take 81 mg by mouth daily.    . cetirizine (ZYRTEC) 10 MG tablet Take 10 mg by mouth daily.    . colchicine 0.6 MG tablet Take 0.6 mg by mouth daily as needed (gout).     Marland Kitchen escitalopram (LEXAPRO) 10 MG tablet Take 5 mg by mouth daily.     . furosemide (LASIX) 20 MG tablet TAKE 1 TABLET EVERY DAY (Patient taking differently: Take 10 mg by mouth daily. ) 30 tablet 5  . irbesartan-hydrochlorothiazide (AVALIDE) 150-12.5 MG per tablet Take 0.5 tablets by mouth daily.     . metoprolol succinate (TOPROL-XL) 25 MG 24 hr tablet Take 25 mg by mouth daily.    . nitroGLYCERIN (NITROSTAT) 0.4 MG SL tablet PLACE 1 TABLET (0.4 MG TOTAL) UNDER THE TONGUE EVERY 5 (FIVE) MINUTES AS NEEDED FOR CHEST PAIN. (Patient taking differently: Place 0.4 mg under the tongue every 5 (five) minutes as needed for chest pain. ) 25 tablet 6  . Omega-3 Krill Oil 300 MG CAPS Take 1 capsule by mouth daily.    . pravastatin (PRAVACHOL) 20 MG tablet Take 1 tablet by mouth daily.  4  . predniSONE (DELTASONE) 10 MG tablet Take 10 mg by mouth as needed (gout flare up).     . warfarin (COUMADIN) 5 MG tablet Take 5 mg by mouth daily. As directed  per INR     No current facility-administered medications for this visit.     REVIEW OF SYSTEMS:   [X]  denotes positive finding, [ ]  denotes negative finding Cardiac  Comments:  Chest pain or chest pressure:    Shortness of breath upon exertion:    Short of breath when lying flat:    Irregular heart rhythm:        Vascular    Pain in calf, thigh, or hip brought on by ambulation:    Pain in feet at night that wakes you up from your sleep:     Blood clot in your veins:    Leg swelling:         Pulmonary    Oxygen at home:    Productive cough:     Wheezing:         Neurologic    Sudden weakness in arms or legs:     Sudden numbness in arms or legs:     Sudden onset of difficulty speaking or slurred speech:    Temporary loss of vision in one  eye:     Problems with dizziness:         Gastrointestinal    Blood in stool:     Vomited blood:         Genitourinary    Burning when urinating:     Blood in urine:        Psychiatric    Major depression:         Hematologic    Bleeding problems:    Problems with blood clotting too easily:        Skin    Rashes or ulcers:        Constitutional    Fever or chills:      PHYSICAL EXAM:   Vitals:   12/17/18 1016 12/17/18 1021  BP: 140/72 139/69  Pulse: (!) 45   Resp: 16   Temp: 97.6 F (36.4 C)   TempSrc: Oral   SpO2: 93%   Weight: 215 lb (97.5 kg)   Height: 5\' 11"  (1.803 m)     GENERAL: The patient is a well-nourished male, in no acute distress. The vital signs are documented above. CARDIAC: There is a regular rate and rhythm. PULMONARY: Non-labored respirations ABDOMEN: Soft and non-tender with normal pitched bowel sounds.  MUSCULOSKELETAL: There are no major deformities or cyanosis. NEUROLOGIC: No focal weakness or paresthesias are detected. SKIN: There are no ulcers or rashes noted. PSYCHIATRIC: The patient has a normal affect.  STUDIES:   I have reviewed his CTA with the following findings:  Vascular:  Ascending aortic graft repair is stable in appearance without evidence of complication or pseudoaneurysm.  Maximal diameter of the aortic arch is 3.6. Recommend annual imaging followup by CTA or MRA. This recommendation follows 2010 ACCF/AHA/AATS/ACR/ASA/SCA/SCAI/SIR/STS/SVM Guidelines for the Diagnosis and Management of Patients with Thoracic Aortic Disease. Circulation.2010; 121: Z610-R604. Aortic aneurysm NOS (ICD10-I71.9) cm.  Abdominal aortic aneurysm has increased from a maximal diameter of 3.4 cm on the prior study to 5.2 cm on today's study.  Significant narrowing in the left renal artery.  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 This portion is associated with a prominent penetrating atherosclerotic  ulcer.  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  Stable left common iliac artery aneurysm at 2.2 cm in maximal caliber.  Nonvascular:  **An incidental finding of potential clinical significance has been found. ** New  1.9 cm enhancing mass at the lower pole of the right kidney. Renal cell carcinoma is not excluded. MRI of the kidneys with contrast is recommended.  Scarring at the right lung base.  Cholelithiasis.  Carotid Duplex:  1-39% bilateral stenosis  ABI:   ABI/TBIToday's ABIToday's TBIPrevious ABIPrevious TBI +-------+-----------+-----------+------------+------------+ Right  1.21       0.91                                +-------+-----------+-----------+------------+------------+ Left   1.33       0.99                                +-------+-----------+-----------+------------+------------+  Duplex:  Right: No evidence of aneurysm.  Left: No evidence of aneurysm.   MEDICAL ISSUES:   Renal lesion:  MRI will be ordered  AAA: We discussed proceeding with repair.  I feel he is a candidate for endovascular repair.  I discussed the risk benefits the operation including the risk of death, renal injury, bleeding, intestinal ischemia, lower extremity ischemia, and endoleak.  All of his questions were answered.  I am going to send him for cardiology clearance as well as assistance in management of his Coumadin.  I have him scheduled for Wednesday, February 5    Annamarie Major, MD [facility] [phone] Pager [phone]

## 2018-12-17 NOTE — H&P (View-Only) (Signed)
[facility]  Patient name: Kenneth Scott MRN: [id number] DOB: 1946-07-16 Sex: male   REASON FOR VISIT:    Follow up  HISOTRY OF PRESENT ILLNESS:   Kenneth Scott is a 73 y.o. male, who is referred for evaluation of an abdominal aortic aneurysm that measured 4.8 cm on ultrasound.  He is without abdominal pain.  Patient has a history of coronary artery disease, status post myocardial infarction with PCI of his LAD in 2005.  He later went on to CABG.  He had valve replacement with aortic root repair for a sending aortic aneurysm.  The patient has a history of DVT and pulmonary embolism in 2010.  He is on chronic anticoagulation.  He also has a history of a TIA.  His hypercholesterolemia is managed with a statin.  His hypertension is medically managed as well.  He is a non-smoker.   PAST MEDICAL HISTORY:   Past Medical History:  Diagnosis Date  . AAA (abdominal aortic aneurysm) ([place]) 03/04/13   aortic duplex dopler- when compared tp previous study ther is no change. aneurysmal dilation measuring 3.5 x 3.4 cemtimeters  . Anxiety   . Coronary artery disease 04/23/13   NUC STRESS TEST-EF 52% High risk scan two separate perfusion defects and a suggestion of reveribility in the inferior-inferolateral wall.  . Hyperlipidemia   . Hypertension 09/06/12   ECHO-EF 45%   . PE (pulmonary embolism)   . TIA (transient ischemic attack)      FAMILY HISTORY:   Family History  Problem Relation Age of Onset  . Heart disease Mother   . Heart disease Father   . Cancer Sister     SOCIAL HISTORY:   Social History   Tobacco Use  . Smoking status: Never Smoker  . Smokeless tobacco: Never Used  Substance Use Topics  . Alcohol use: No     ALLERGIES:   No Known Allergies   CURRENT MEDICATIONS:   Current Outpatient Medications  Medication Sig Dispense Refill  . allopurinol (ZYLOPRIM) 300 MG tablet Take 1 tablet by  mouth daily.  4  . aspirin 81 MG tablet Take 81 mg by mouth daily.    . cetirizine (ZYRTEC) 10 MG tablet Take 10 mg by mouth daily.    . colchicine 0.6 MG tablet Take 0.6 mg by mouth daily as needed (gout).     Marland Kitchen escitalopram (LEXAPRO) 10 MG tablet Take 5 mg by mouth daily.     . furosemide (LASIX) 20 MG tablet TAKE 1 TABLET EVERY DAY (Patient taking differently: Take 10 mg by mouth daily. ) 30 tablet 5  . irbesartan-hydrochlorothiazide (AVALIDE) 150-12.5 MG per tablet Take 0.5 tablets by mouth daily.     . metoprolol succinate (TOPROL-XL) 25 MG 24 hr tablet Take 25 mg by mouth daily.    . nitroGLYCERIN (NITROSTAT) 0.4 MG SL tablet PLACE 1 TABLET (0.4 MG TOTAL) UNDER THE TONGUE EVERY 5 (FIVE) MINUTES AS NEEDED FOR CHEST PAIN. (Patient taking differently: Place 0.4 mg under the tongue every 5 (five) minutes as needed for chest pain. ) 25 tablet 6  . Omega-3 Krill Oil 300 MG CAPS Take 1 capsule by mouth daily.    . pravastatin (PRAVACHOL) 20 MG tablet Take 1 tablet by mouth daily.  4  . predniSONE (DELTASONE) 10 MG tablet Take 10 mg by mouth as needed (gout flare up).     . warfarin (COUMADIN) 5 MG tablet Take 5 mg by mouth daily. As directed  per INR     No current facility-administered medications for this visit.     REVIEW OF SYSTEMS:   [X]  denotes positive finding, [ ]  denotes negative finding Cardiac  Comments:  Chest pain or chest pressure:    Shortness of breath upon exertion:    Short of breath when lying flat:    Irregular heart rhythm:        Vascular    Pain in calf, thigh, or hip brought on by ambulation:    Pain in feet at night that wakes you up from your sleep:     Blood clot in your veins:    Leg swelling:         Pulmonary    Oxygen at home:    Productive cough:     Wheezing:         Neurologic    Sudden weakness in arms or legs:     Sudden numbness in arms or legs:     Sudden onset of difficulty speaking or slurred speech:    Temporary loss of vision in one  eye:     Problems with dizziness:         Gastrointestinal    Blood in stool:     Vomited blood:         Genitourinary    Burning when urinating:     Blood in urine:        Psychiatric    Major depression:         Hematologic    Bleeding problems:    Problems with blood clotting too easily:        Skin    Rashes or ulcers:        Constitutional    Fever or chills:      PHYSICAL EXAM:   Vitals:   12/17/18 1016 12/17/18 1021  BP: 140/72 139/69  Pulse: (!) 45   Resp: 16   Temp: 97.6 F (36.4 C)   TempSrc: Oral   SpO2: 93%   Weight: 215 lb (97.5 kg)   Height: 5\' 11"  (1.803 m)     GENERAL: The patient is a well-nourished male, in no acute distress. The vital signs are documented above. CARDIAC: There is a regular rate and rhythm. PULMONARY: Non-labored respirations ABDOMEN: Soft and non-tender with normal pitched bowel sounds.  MUSCULOSKELETAL: There are no major deformities or cyanosis. NEUROLOGIC: No focal weakness or paresthesias are detected. SKIN: There are no ulcers or rashes noted. PSYCHIATRIC: The patient has a normal affect.  STUDIES:   I have reviewed his CTA with the following findings:  Vascular:  Ascending aortic graft repair is stable in appearance without evidence of complication or pseudoaneurysm.  Maximal diameter of the aortic arch is 3.6. Recommend annual imaging followup by CTA or MRA. This recommendation follows 2010 ACCF/AHA/AATS/ACR/ASA/SCA/SCAI/SIR/STS/SVM Guidelines for the Diagnosis and Management of Patients with Thoracic Aortic Disease. Circulation.2010; 121: Z169-C789. Aortic aneurysm NOS (ICD10-I71.9) cm.  Abdominal aortic aneurysm has increased from a maximal diameter of 3.4 cm on the prior study to 5.2 cm on today's study.  Significant narrowing in the left renal artery.  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 This portion is associated with a prominent penetrating atherosclerotic  ulcer.  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  Stable left common iliac artery aneurysm at 2.2 cm in maximal caliber.  Nonvascular:  **An incidental finding of potential clinical significance has been found. ** New  1.9 cm enhancing mass at the lower pole of the right kidney. Renal cell carcinoma is not excluded. MRI of the kidneys with contrast is recommended.  Scarring at the right lung base.  Cholelithiasis.  Carotid Duplex:  1-39% bilateral stenosis  ABI:   ABI/TBIToday's ABIToday's TBIPrevious ABIPrevious TBI +-------+-----------+-----------+------------+------------+ Right  1.21       0.91                                +-------+-----------+-----------+------------+------------+ Left   1.33       0.99                                +-------+-----------+-----------+------------+------------+  Duplex:  Right: No evidence of aneurysm.  Left: No evidence of aneurysm.   MEDICAL ISSUES:   Renal lesion:  MRI will be ordered  AAA: We discussed proceeding with repair.  I feel he is a candidate for endovascular repair.  I discussed the risk benefits the operation including the risk of death, renal injury, bleeding, intestinal ischemia, lower extremity ischemia, and endoleak.  All of his questions were answered.  I am going to send him for cardiology clearance as well as assistance in management of his Coumadin.  I have him scheduled for Wednesday, February 5    Annamarie Major, MD [facility] [phone] Pager [phone]

## 2018-12-18 ENCOUNTER — Other Ambulatory Visit: Payer: Self-pay

## 2018-12-18 DIAGNOSIS — N2889 Other specified disorders of kidney and ureter: Secondary | ICD-10-CM

## 2018-12-19 ENCOUNTER — Telehealth: Payer: Self-pay | Admitting: *Deleted

## 2018-12-19 ENCOUNTER — Other Ambulatory Visit: Payer: Self-pay | Admitting: *Deleted

## 2018-12-19 NOTE — Telephone Encounter (Signed)
Call to patient and instructed to be at [facility] admitting at 8:15 am on 01/02/2019 for surgery. All other instructions per letter of 12/17/2018.

## 2018-12-20 ENCOUNTER — Encounter: Payer: Self-pay | Admitting: Surgery

## 2018-12-20 ENCOUNTER — Telehealth: Payer: Self-pay | Admitting: *Deleted

## 2018-12-20 ENCOUNTER — Telehealth: Payer: Self-pay

## 2018-12-20 NOTE — Telephone Encounter (Addendum)
   [facility] Pre-operative Risk Assessment    Request for surgical clearance:  1. What type of surgery is being performed? EVAR  2. When is this surgery scheduled? 01/02/19  What type of clearance is required (medical clearance vs. Pharmacy clearance to hold med vs. Both)? Both 3. Are there any medications that need to be held prior to surgery and how long? Coumadin   4. Practice name and name of physician performing surgery? [facility]  Dr.Brabham  5. What is your office phone number [phone]   7.   What is your office fax number [phone]  8.   Anesthesia type General   Kathyrn Lass 12/20/2018, 12:59 PM  _________________________________________________________________   (provider comments below)

## 2018-12-20 NOTE — Telephone Encounter (Signed)
Request for Surgical Clearance   THIS IS A DUPLICATE FOR CORRECTION   1. What type of surgery is being performed?  EVAR    2. When is this surgery scheduled? 01/02/2019  3. What type of clearance is required (medical clearance vs. Pharmacy clearance to hold med vs. Both)? CARDIAC CLEARANCE AND PHARMACY CLEARANCE    4. Are there any medications that need to be held prior to surgery and how long?  COUMADIN ONLY FOR 5 DAY HOLD PATIENT TO STAY ON ASPIRIN    5. Practice name and name of physician performing surgery?   [facility] DR. Trula Slade     6.  What is your office phone number? [phone]    7. What is your office fax number? (Be sure to include anyone who it needs to go Attn to) [place]    8. Anesthesia type (None, local, MAC, general)? GENERAL   REMINDER TO USER: Remember to please route this message to P CV DIV PREOP in a phone note.

## 2018-12-24 NOTE — Telephone Encounter (Signed)
Pt takes warfarin for history of DVT and PE in 2010, also with history bioprosthetic AVR and possible TIA in 2013 (pt should have been on warfarin then however not clear due to lack of records during this time). PCP manages warfarin, pt has not been seen by Dr Claiborne Billings since mid 2018.   Will defer to Dr Claiborne Billings regarding potential need for Lovenox bridge.

## 2018-12-25 ENCOUNTER — Other Ambulatory Visit: Payer: Self-pay | Admitting: *Deleted

## 2018-12-25 NOTE — Telephone Encounter (Signed)
Reviewed with Dr. Claiborne Billings.  Patient was last seen by Dr. Claiborne Billings in May 2018 and was to follow up in 6 months.  We cannot clear this patient for surgery unless he can be seen by one of our providers.

## 2018-12-26 ENCOUNTER — Telehealth: Payer: Self-pay | Admitting: *Deleted

## 2018-12-26 NOTE — Telephone Encounter (Signed)
Spoke with Avaleeta @ Dr. Evette Georges office who informed me they can schedule patient for a 9 am appointment on 12/28/2018 with Cardiology. First request for cardiac clearance was sent on 12/17/2018. Asked her to please call patient right away with this appointment instruction  Call to [facility] patient's sig. Other and informed her to reschedule 12/28/2018 8 am  Pre-op visit at hospital so that Leeum can get in first to see cardiology. Made it clear to her that patient has to make the cardiology appt. and be cleared before EVAR surgery.

## 2018-12-27 ENCOUNTER — Other Ambulatory Visit (HOSPITAL_COMMUNITY): Payer: Medicare Other

## 2018-12-27 NOTE — Telephone Encounter (Signed)
   Primary Cardiologist:Thomas Claiborne Billings, MD  Chart reviewed as part of pre-operative protocol coverage. Because of Kenneth Scott's past medical history and time since last visit, he/she will require a follow-up visit in order to better assess preoperative cardiovascular risk.    Pre-op covering staff: - Please schedule appointment and call patient to inform them. - Please contact requesting surgeon's office via preferred method (i.e, phone, fax) to inform them of need for appointment prior to surgery.   Abigail Butts, PA-C  12/27/2018, 2:55 PM

## 2018-12-28 ENCOUNTER — Inpatient Hospital Stay (HOSPITAL_COMMUNITY): Admission: RE | Admit: 2018-12-28 | Payer: Medicare Other | Source: Ambulatory Visit

## 2018-12-28 ENCOUNTER — Other Ambulatory Visit: Payer: Medicare Other

## 2018-12-28 ENCOUNTER — Telehealth (HOSPITAL_COMMUNITY): Payer: Self-pay | Admitting: Radiology

## 2018-12-28 ENCOUNTER — Ambulatory Visit (INDEPENDENT_AMBULATORY_CARE_PROVIDER_SITE_OTHER): Payer: Medicare Other | Admitting: Physician Assistant

## 2018-12-28 ENCOUNTER — Encounter: Payer: Self-pay | Admitting: Physician Assistant

## 2018-12-28 VITALS — BP 132/62 | HR 47 | Ht 71.0 in | Wt 215.2 lb

## 2018-12-28 DIAGNOSIS — I25810 Atherosclerosis of coronary artery bypass graft(s) without angina pectoris: Secondary | ICD-10-CM | POA: Diagnosis not present

## 2018-12-28 DIAGNOSIS — I714 Abdominal aortic aneurysm, without rupture, unspecified: Secondary | ICD-10-CM

## 2018-12-28 DIAGNOSIS — E785 Hyperlipidemia, unspecified: Secondary | ICD-10-CM

## 2018-12-28 DIAGNOSIS — Z01810 Encounter for preprocedural cardiovascular examination: Secondary | ICD-10-CM

## 2018-12-28 DIAGNOSIS — I10 Essential (primary) hypertension: Secondary | ICD-10-CM | POA: Diagnosis not present

## 2018-12-28 DIAGNOSIS — Z952 Presence of prosthetic heart valve: Secondary | ICD-10-CM

## 2018-12-28 DIAGNOSIS — I8291 Chronic embolism and thrombosis of unspecified vein: Secondary | ICD-10-CM

## 2018-12-28 DIAGNOSIS — N2889 Other specified disorders of kidney and ureter: Secondary | ICD-10-CM

## 2018-12-28 MED ORDER — METOPROLOL SUCCINATE ER 25 MG PO TB24: 12.5000 mg | ORAL_TABLET | Freq: Every day | ORAL | 1 refills | Status: DC

## 2018-12-28 NOTE — Telephone Encounter (Signed)
Patient given detailed instructions per Myocardial Perfusion Study Information Sheet for the test on 12/31/2018 at 10:15. Patient notified to arrive 15 minutes early and that it is imperative to arrive on time for appointment to keep from having the test rescheduled.  If you need to cancel or reschedule your appointment, please call the office within 24 hours of your appointment. . Patient verbalized understanding.EHK

## 2018-12-28 NOTE — Progress Notes (Addendum)
Cardiology Office Note    Date:  12/30/2018   ID:  Holman, Bonsignore 28-Dec-1945, MRN [id number]  PCP:  Dione Housekeeper, MD  Cardiologist:  Dr. Claiborne Billings   Chief Complaint  Patient presents with  . Follow-up    seen for Dr. Claiborne Billings.     History of Present Illness:  Kenneth Scott is a 73 y.o. 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  Myoview email 2014 showed a fixed defect in the apex area of most consistent with his previous MI.  There was suggestion of mild reversibility in the inferior, inferolateral, and the lateral area.  This has not changed significantly from previous study.  Last echocardiogram obtained on 08/12/2016 showed EF 50 to 55%, akinesis of the mid apical anteroseptal myocardium, grade 2 DD, mild LAE, mild AI.  Patient has been followed by vascular surgery.  Recent carotid Doppler obtained on 12/17/2018 showed a mild carotid disease bilaterally.  Bilateral ABI was normal as well.  Patient was recently seen by Dr. Trula Slade on the same day.  Heart rate was 45 at the time.  Recent CT angiogram of the abdomen showed the previous abdominal aortic aneurysm has increased from 3.4 cm on the previous study to 5.2 cm.  Given the size of his AAA, was decided to proceed with endovascular repair.   Patient presents today for cardiology office visit.  He denies any recent chest pain.  He has been having some dyspnea with exertion, however this has not changed recently.  His surgery is a high risk procedure, I would recommend a Lexiscan stress test on Monday prior to the surgery.  Unfortunately, patient was not told that he need to hold Coumadin.  Since there is only 5 days between now and his upcoming surgery, I asked him to start holding the Coumadin today.  However he will need to call his PCP's Coumadin clinic today to  figure out if he needs Lovenox bridge.  He is also due for MRI of the abdomen to make sure there is no renal cancer as recent CT of abdomen showed a 1.9 cm enhancing mass in the lower pole of the right kidney.   Past Medical History:  Diagnosis Date  . AAA (abdominal aortic aneurysm) ([place]) 03/04/13   aortic duplex dopler- when compared tp previous study ther is no change. aneurysmal dilation measuring 3.5 x 3.4 cemtimeters  . Anxiety   . Coronary artery disease 04/23/13   NUC STRESS TEST-EF 52% High risk scan two separate perfusion defects and a suggestion of reveribility in the inferior-inferolateral wall.  . Hyperlipidemia   . Hypertension 09/06/12   ECHO-EF 45%   . PE (pulmonary embolism)   . TIA (transient ischemic attack)     Past Surgical History:  Procedure Laterality Date  . AORTIC VALVE REPLACEMENT     Using a 25 mm homograft with reimplanation of his coronary arteries  . CORONARY ARTERY BYPASS GRAFT  2005   By Dr Cyndia Bent, He had a LIMA placed to the LAD, a vien to the diagonal, a vein to the marginal, a vein to the 2nd diagonal, a vein  to the PDA, PLA vessel.    Current Medications: Outpatient Medications Prior to Visit  Medication Sig Dispense Refill  . allopurinol (ZYLOPRIM) 300 MG tablet Take 300 mg by  mouth daily.   4  . aspirin 81 MG tablet Take 81 mg by mouth daily.    . Coenzyme Q10 (CO Q-10) 200 MG CAPS Take 200 mg by mouth daily.    . colchicine 0.6 MG tablet Take 0.6 mg by mouth daily as needed (gout).     Marland Kitchen escitalopram (LEXAPRO) 10 MG tablet Take 5 mg by mouth daily.     . furosemide (LASIX) 20 MG tablet TAKE 1 TABLET EVERY DAY 30 tablet 5  . furosemide (LASIX) 40 MG tablet Take 20 mg by mouth daily.    . irbesartan-hydrochlorothiazide (AVALIDE) 150-12.5 MG per tablet Take 0.5 tablets by mouth daily.     . pravastatin (PRAVACHOL) 20 MG tablet Take 20 mg by mouth daily.   4  . predniSONE (DELTASONE) 10 MG tablet Take 10 mg by mouth as needed (gout flare up).      . warfarin (COUMADIN) 5 MG tablet Take 5 mg by mouth daily.     . metoprolol succinate (TOPROL-XL) 25 MG 24 hr tablet Take 25 mg by mouth daily.    . nitroGLYCERIN (NITROSTAT) 0.4 MG SL tablet PLACE 1 TABLET (0.4 MG TOTAL) UNDER THE TONGUE EVERY 5 (FIVE) MINUTES AS NEEDED FOR CHEST PAIN. (Patient not taking: Reported on 12/28/2018) 25 tablet 6   No facility-administered medications prior to visit.      Allergies:   Patient has no known allergies.   Social History   Socioeconomic History  . Marital status: Divorced    Spouse name: Not on file  . Number of children: 2  . Years of education: Not on file  . Highest education level: Not on file  Occupational History  . Not on file  Social Needs  . Financial resource strain: Not on file  . Food insecurity:    Worry: Not on file    Inability: Not on file  . Transportation needs:    Medical: Not on file    Non-medical: Not on file  Tobacco Use  . Smoking status: Never Smoker  . Smokeless tobacco: Never Used  Substance and Sexual Activity  . Alcohol use: No  . Drug use: No  . Sexual activity: Not on file  Lifestyle  . Physical activity:    Days per week: Not on file    Minutes per session: Not on file  . Stress: Not on file  Relationships  . Social connections:    Talks on phone: Not on file    Gets together: Not on file    Attends religious service: Not on file    Active member of club or organization: Not on file    Attends meetings of clubs or organizations: Not on file    Relationship status: Not on file  Other Topics Concern  . Not on file  Social History Narrative  . Not on file     Family History:  The patient's family history includes Cancer in his sister; Heart disease in his father and mother.   ROS:   Please see the history of present illness.    ROS All other systems reviewed and are negative.   PHYSICAL EXAM:   VS:  BP 132/62   Pulse (!) 47   Ht 5\' 11"  (1.803 m)   Wt 215 lb 3.2 oz (97.6 kg)   BMI  30.01 kg/m    GEN: Well nourished, well developed, in no acute distress  HEENT: normal  Neck: no JVD, carotid bruits, or masses Cardiac: RRR; no  murmurs, rubs, or gallops,no edema  Respiratory:  clear to auscultation bilaterally, normal work of breathing GI: soft, nontender, nondistended, + BS MS: no deformity or atrophy  Skin: warm and dry, no rash Neuro:  Alert and Oriented x 3, Strength and sensation are intact Psych: euthymic mood, full affect  Wt Readings from Last 3 Encounters:  12/28/18 215 lb 3.2 oz (97.6 kg)  12/17/18 215 lb (97.5 kg)  05/28/18 215 lb (97.5 kg)      Studies/Labs Reviewed:   EKG:  EKG is ordered today.  The ekg ordered today demonstrates sinus bradycardia.  Recent Labs: No results found for requested labs within last 8760 hours.   Lipid Panel    Component Value Date/Time   CHOL 157 07/29/2016 0845   TRIG 217 (H) 07/29/2016 0845   HDL 34 (L) 07/29/2016 0845   CHOLHDL 4.6 07/29/2016 0845   VLDL 43 (H) 07/29/2016 0845   LDLCALC 80 07/29/2016 0845    Additional studies/ records that were reviewed today include:   Echo 08/12/2016 LV EF: 50% -   55% Study Conclusions  - Left ventricle: The cavity size was normal. Wall thickness was   increased in a pattern of mild LVH. Systolic function was normal.   The estimated ejection fraction was in the range of 50% to 55%.   There is akinesis of the mid-apicalanteroseptal myocardium.   Features are consistent with a pseudonormal left ventricular   filling pattern, with concomitant abnormal relaxation and   increased filling pressure (grade 2 diastolic dysfunction). - Aortic valve: An aortic root homograft was present. There was   mild regurgitation. - Left atrium: The atrium was mildly dilated.  Impressions:  - Akinesis of the distal septum with overall preserved LV function;   grade 2 diastolic dysfunction; mild LVH; s/p AVR with mild AI;   mild LAE.    ASSESSMENT:    1. Preop  cardiovascular exam   2. Essential hypertension   3. Coronary artery disease involving coronary bypass graft of native heart without angina pectoris   4. Preoperative cardiovascular examination   5. Abdominal aortic aneurysm (AAA) without rupture ([place])   6. Hyperlipidemia LDL goal <70   7. S/P AVR (aortic valve replacement) and aortoplasty   8. 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 Right kidney mass      PLAN:  In order of problems listed above:  1. Preoperative clearance: Pending upcoming AAA endovascular repair.  This is considered a high risk procedure.  I plan to obtain Myoview next Monday prior to final clearance.  His PCP is Coumadin clinic, however patient is unaware of the fact that he will need to hold Coumadin prior to surgery, since there is only 5 days between now and his upcoming surgery, I asked him to hold Coumadin starting today.  He as soon as he leaves the office, he will contact his PCPs Coumadin clinic to verify whether or not he will need Lovenox bridge.  2. History of DVT/PE: On Coumadin, managed by the Coumadin clinic at his PCPs office  3. History of thoracic aortic aneurysm status post aortic resection and AVR: No obvious issue  4. CAD s/p CABG: Denies any recent chest discomfort.  5. Hypertension: Blood pressure stable on current therapy.  I reduced his Toprol-XL dosage given his bradycardia.  6. AAA: Recent abdominal CT showed increase in size, planning for AAA endovascular repair  7. Hyperlipidemia: Continue pravastatin  8. Right renal mass: Also seen on recent  abdominal CT.  Pending further image.    Medication Adjustments/Labs and Tests Ordered: Current medicines are reviewed at length with the patient today.  Concerns regarding medicines are outlined above.  Medication changes, Labs and Tests ordered today are listed in the Patient Instructions below. Patient Instructions  Medication Instructions:  Decrease Metoprolol to 12.5  mg daily.  HOLD Coumadin starting today.  Please contact your primary care coumadin clinic to discuss whether you will need lovenox bridging for your procedure. You will need to see them on Monday afternoon after your stress test.  If you need a refill on your cardiac medications before your next appointment, please call your pharmacy.   Testing/Procedures: You have been scheduled for a Lexiscan Myoview at our [facility] on Monday at 10:15 AM.   Follow-Up: At [facility], you and your health needs are our priority.  As part of our continuing mission to provide you with exceptional heart care, we have created designated Provider Care Teams.  These Care Teams include your primary Cardiologist (physician) and Advanced Practice Providers (APPs -  Physician Assistants and Nurse Practitioners) who all work together to provide you with the care you need, when you need it. You will need a follow up appointment in 6 months with Dr. Claiborne Billings.  Please call our office 2 months in advance to schedule this appointment.   Advanced Practice Providers on your designated Care Team: Kenneth Scott, [place] . Fabian Sharp, PA-C  Any Other Special Instructions Will Be Listed Below (If Applicable). None.      Kenneth Scott, [place]  12/30/2018 11:26 PM    [facility] [address], [place], [place]  [address] Phone: [phone]; Fax: [phone]

## 2018-12-28 NOTE — Patient Instructions (Signed)
Medication Instructions:  Decrease Metoprolol to 12.5 mg daily.  HOLD Coumadin starting today.  Please contact your primary care coumadin clinic to discuss whether you will need lovenox bridging for your procedure. You will need to see them on Monday afternoon after your stress test.  If you need a refill on your cardiac medications before your next appointment, please call your pharmacy.   Testing/Procedures: You have been scheduled for a Lexiscan Myoview at our [facility] on Monday at 10:15 AM.   Follow-Up: At [facility], you and your health needs are our priority.  As part of our continuing mission to provide you with exceptional heart care, we have created designated Provider Care Teams.  These Care Teams include your primary Cardiologist (physician) and Advanced Practice Providers (APPs -  Physician Assistants and Nurse Practitioners) who all work together to provide you with the care you need, when you need it. You will need a follow up appointment in 6 months with Dr. Claiborne Billings.  Please call our office 2 months in advance to schedule this appointment.   Advanced Practice Providers on your designated Care Team: [place], [place] . Fabian Sharp, PA-C  Any Other Special Instructions Will Be Listed Below (If Applicable). None.

## 2018-12-28 NOTE — Telephone Encounter (Signed)
Pt has an appointment schedule for today (1/31) with Almyra Deforest at 9:30 for a surgical clearance . Will manually fax over clearance notes to requesting surgeon's office.

## 2018-12-29 ENCOUNTER — Ambulatory Visit
Admission: RE | Admit: 2018-12-29 | Discharge: 2018-12-29 | Disposition: A | Discharge status: Home / Self Care | Payer: Medicare Other | Source: Ambulatory Visit | Attending: Surgery | Admitting: Surgery

## 2018-12-29 ENCOUNTER — Inpatient Hospital Stay: Admission: RE | Admit: 2018-12-29 | Payer: Medicare Other | Source: Ambulatory Visit

## 2018-12-29 DIAGNOSIS — N2889 Other specified disorders of kidney and ureter: Secondary | ICD-10-CM

## 2018-12-29 MED ORDER — GADOBENATE DIMEGLUMINE 529 MG/ML IV SOLN
20.0000 mL | Freq: Once | INTRAVENOUS | Status: AC | PRN
  Administered 2018-12-29: 20 mL via INTRAVENOUS

## 2018-12-30 ENCOUNTER — Encounter: Payer: Self-pay | Admitting: Physician Assistant

## 2018-12-31 ENCOUNTER — Ambulatory Visit (HOSPITAL_BASED_OUTPATIENT_CLINIC_OR_DEPARTMENT_OTHER): Payer: Medicare Other

## 2018-12-31 ENCOUNTER — Telehealth: Payer: Self-pay | Admitting: *Deleted

## 2018-12-31 ENCOUNTER — Telehealth: Payer: Self-pay | Admitting: Cardiovascular Disease

## 2018-12-31 DIAGNOSIS — Z01810 Encounter for preprocedural cardiovascular examination: Secondary | ICD-10-CM

## 2018-12-31 DIAGNOSIS — I25810 Atherosclerosis of coronary artery bypass graft(s) without angina pectoris: Secondary | ICD-10-CM | POA: Insufficient documentation

## 2018-12-31 LAB — MYOCARDIAL PERFUSION IMAGING
LV dias vol: 102 mL (ref 62–150)
LV sys vol: 46 mL
Peak HR: 85 {beats}/min
Rest HR: 52 {beats}/min
SDS: 2
SRS: 3
SSS: 5
TID: 0.96

## 2018-12-31 MED ORDER — TECHNETIUM TC 99M TETROFOSMIN IV KIT
10.1000 | PACK | Freq: Once | INTRAVENOUS | Status: AC | PRN
  Administered 2018-12-31: 10.1 via INTRAVENOUS
  Filled 2018-12-31: qty 11

## 2018-12-31 MED ORDER — REGADENOSON 0.4 MG/5ML IV SOLN
0.4000 mg | Freq: Once | INTRAVENOUS | Status: AC
  Administered 2018-12-31: 0.4 mg via INTRAVENOUS

## 2018-12-31 MED ORDER — TECHNETIUM TC 99M TETROFOSMIN IV KIT
31.2000 | PACK | Freq: Once | INTRAVENOUS | Status: AC | PRN
  Administered 2018-12-31: 31.2 via INTRAVENOUS
  Filled 2018-12-31: qty 32

## 2018-12-31 NOTE — Telephone Encounter (Signed)
Dr Ellyn Hack spoke with Dr Leonarda Salon regarding patient.

## 2018-12-31 NOTE — Telephone Encounter (Signed)
Left voice mail x 3 for patient to call this office regarding Lovenox Rx.

## 2018-12-31 NOTE — Telephone Encounter (Signed)
Informed patient's significant other that Rx. For Lovenox was called into their pharmacy and needs to be started today.Coumadin on hold since 12/28/2018. Verbalized understanding.

## 2018-12-31 NOTE — Telephone Encounter (Signed)
Per Dr. Trula Slade. Lovenox injection 90 mg BID starting today x 10 doses. Hold am dose morning of surgery. [facility] She state they can give instructions to patient regarding how to inject. [facility] starting on 12/28/2018.

## 2019-01-01 ENCOUNTER — Other Ambulatory Visit: Payer: Self-pay

## 2019-01-01 ENCOUNTER — Encounter (HOSPITAL_COMMUNITY): Payer: Self-pay

## 2019-01-01 ENCOUNTER — Encounter: Payer: Self-pay | Admitting: Physician Assistant

## 2019-01-01 ENCOUNTER — Encounter: Payer: Self-pay | Admitting: Surgery

## 2019-01-01 ENCOUNTER — Encounter (HOSPITAL_COMMUNITY)
Admission: RE | Admit: 2019-01-01 | Discharge: 2019-01-01 | Disposition: A | Discharge status: Home / Self Care | Payer: Medicare Other | Source: Ambulatory Visit | Attending: Surgery | Admitting: Surgery

## 2019-01-01 ENCOUNTER — Telehealth: Payer: Self-pay | Admitting: *Deleted

## 2019-01-01 DIAGNOSIS — E785 Hyperlipidemia, unspecified: Secondary | ICD-10-CM | POA: Insufficient documentation

## 2019-01-01 DIAGNOSIS — I2510 Atherosclerotic heart disease of native coronary artery without angina pectoris: Secondary | ICD-10-CM | POA: Insufficient documentation

## 2019-01-01 DIAGNOSIS — Z01818 Encounter for other preprocedural examination: Secondary | ICD-10-CM

## 2019-01-01 DIAGNOSIS — Z7901 Long term (current) use of anticoagulants: Secondary | ICD-10-CM

## 2019-01-01 DIAGNOSIS — I10 Essential (primary) hypertension: Secondary | ICD-10-CM | POA: Insufficient documentation

## 2019-01-01 DIAGNOSIS — Z86718 Personal history of other venous thrombosis and embolism: Secondary | ICD-10-CM | POA: Insufficient documentation

## 2019-01-01 DIAGNOSIS — G4733 Obstructive sleep apnea (adult) (pediatric): Secondary | ICD-10-CM

## 2019-01-01 DIAGNOSIS — Z7952 Long term (current) use of systemic steroids: Secondary | ICD-10-CM | POA: Insufficient documentation

## 2019-01-01 DIAGNOSIS — Z951 Presence of aortocoronary bypass graft: Secondary | ICD-10-CM

## 2019-01-01 DIAGNOSIS — I714 Abdominal aortic aneurysm, without rupture: Secondary | ICD-10-CM | POA: Insufficient documentation

## 2019-01-01 DIAGNOSIS — F419 Anxiety disorder, unspecified: Secondary | ICD-10-CM

## 2019-01-01 DIAGNOSIS — Z86711 Personal history of pulmonary embolism: Secondary | ICD-10-CM

## 2019-01-01 DIAGNOSIS — Z79899 Other long term (current) drug therapy: Secondary | ICD-10-CM | POA: Insufficient documentation

## 2019-01-01 DIAGNOSIS — I252 Old myocardial infarction: Secondary | ICD-10-CM | POA: Insufficient documentation

## 2019-01-01 DIAGNOSIS — Z7982 Long term (current) use of aspirin: Secondary | ICD-10-CM | POA: Insufficient documentation

## 2019-01-01 DIAGNOSIS — Z8673 Personal history of transient ischemic attack (TIA), and cerebral infarction without residual deficits: Secondary | ICD-10-CM | POA: Insufficient documentation

## 2019-01-01 DIAGNOSIS — Z87442 Personal history of urinary calculi: Secondary | ICD-10-CM

## 2019-01-01 DIAGNOSIS — Z952 Presence of prosthetic heart valve: Secondary | ICD-10-CM

## 2019-01-01 HISTORY — DX: Sleep apnea, unspecified: G47.30

## 2019-01-01 HISTORY — DX: Personal history of urinary calculi: Z87.442

## 2019-01-01 LAB — COMPREHENSIVE METABOLIC PANEL
ALT: 36 U/L (ref 0–44)
AST: 32 U/L (ref 15–41)
Albumin: 4.3 g/dL (ref 3.5–5.0)
Alkaline Phosphatase: 84 U/L (ref 38–126)
Anion gap: 11 (ref 5–15)
BUN: 21 mg/dL (ref 8–23)
CO2: 27 mmol/L (ref 22–32)
Calcium: 9.7 mg/dL (ref 8.9–10.3)
Chloride: 105 mmol/L (ref 98–111)
Creatinine, Ser: 1.24 mg/dL (ref 0.61–1.24)
GFR calc Af Amer: >60 mL/min (ref >60)
GFR calc non Af Amer: 57 mL/min — ABNORMAL LOW (ref >60)
Glucose, Bld: 87 mg/dL (ref 70–99)
Potassium: 3.5 mmol/L (ref 3.5–5.1)
Sodium: 143 mmol/L (ref 135–145)
Total Bilirubin: 0.8 mg/dL (ref 0.3–1.2)
Total Protein: 7.6 g/dL (ref 6.5–8.1)

## 2019-01-01 LAB — PROTIME-INR
INR: 1.22
Prothrombin Time: 15.3 seconds — ABNORMAL HIGH (ref 11.4–15.2)

## 2019-01-01 LAB — APTT: aPTT: 31 seconds (ref 24–36)

## 2019-01-01 LAB — URINALYSIS, ROUTINE W REFLEX MICROSCOPIC
BILIRUBIN URINE: NEGATIVE
Bacteria, UA: NONE SEEN
Glucose, UA: NEGATIVE mg/dL
Ketones, ur: NEGATIVE mg/dL
Leukocytes, UA: NEGATIVE
Nitrite: NEGATIVE
PH: 5 (ref 5.0–8.0)
Protein, ur: NEGATIVE mg/dL
Specific Gravity, Urine: 1.008 (ref 1.005–1.030)

## 2019-01-01 LAB — SURGICAL PCR SCREEN
MRSA, PCR: NEGATIVE
Staphylococcus aureus: NEGATIVE

## 2019-01-01 LAB — CBC
HCT: 48.4 % (ref 39.0–52.0)
HEMOGLOBIN: 16 g/dL (ref 13.0–17.0)
MCH: 33.4 pg (ref 26.0–34.0)
MCHC: 33.1 g/dL (ref 30.0–36.0)
MCV: 101 fL — ABNORMAL HIGH (ref 80.0–100.0)
NRBC: 0 % (ref 0.0–0.2)
Platelets: 178 10*3/uL (ref 150–400)
RBC: 4.79 MIL/uL (ref 4.22–5.81)
RDW: 12.8 % (ref 11.5–15.5)
WBC: 8.9 10*3/uL (ref 4.0–10.5)

## 2019-01-01 LAB — ABO/RH: ABO/RH(D): O POS

## 2019-01-01 NOTE — Pre-Procedure Instructions (Signed)
Kenneth Scott  01/01/2019      [id number] [place] [place] [address] [place] [address] Phone: [phone] Fax: [phone]    Your procedure is scheduled on January 02, 2019.  Report to [facility] Admitting at 830 AM.  Call this number if you have problems the morning of surgery:  [phone]   Remember:  Do not eat or drink after midnight.    Take these medicines the morning of surgery with A SIP OF WATER  Allopurinol (zyloprim) Metoprolol succinate (Toprol XL) Colchicine Escitalopram (lexapro) Prednisone (deltasone)-if needed  Follow your surgeon's instructions on when to hold/resume aspirin.  If no instructions were given call the office to determine how they would like to you take aspirin  You should have had your Coumadin on hold since 12/28/2018.  Take lovenox as instructed by your surgeon-hold morning dose tomorrow morning (day of procedure)  Beginning now, STOP taking any Aleve, Naproxen, Ibuprofen, Motrin, Advil, Goody's, BC's, all herbal medications, fish oil, and all vitamins    Do not wear jewelry  Do not wear lotions, powders, or colognes, or deodorant.  Men may shave face and neck.  Do not bring valuables to the hospital.  [facility] is not responsible for any belongings or valuables.  Contacts, dentures or bridgework may not be worn into surgery.  Leave your suitcase in the car.  After surgery it may be brought to your room.  For patients admitted to the hospital, discharge time will be determined by your treatment team.  Patients discharged the day of surgery will not be allowed to drive home.    [facility]- Preparing For Surgery  Before surgery, you can play an important role. Because skin is not sterile, your skin needs to be as free of germs as possible. You can reduce the number of germs on your skin by washing with CHG (chlorahexidine gluconate) Soap before surgery.  CHG is an  antiseptic cleaner which kills germs and bonds with the skin to continue killing germs even after washing.    Oral Hygiene is also important to reduce your risk of infection.  Remember - BRUSH YOUR TEETH THE MORNING OF SURGERY WITH YOUR REGULAR TOOTHPASTE  Please do not use if you have an allergy to CHG or antibacterial soaps. If your skin becomes reddened/irritated stop using the CHG.  Do not shave (including legs and underarms) for at least 48 hours prior to first CHG shower. It is OK to shave your face.  Please follow these instructions carefully.   1. Shower the NIGHT BEFORE SURGERY and the MORNING OF SURGERY with CHG.   2. If you chose to wash your hair, wash your hair first as usual with your normal shampoo.  3. After you shampoo, rinse your hair and body thoroughly to remove the shampoo.  4. Use CHG as you would any other liquid soap. You can apply CHG directly to the skin and wash gently with a scrungie or a clean washcloth.   5. Apply the CHG Soap to your body ONLY FROM THE NECK DOWN.  Do not use on open wounds or open sores. Avoid contact with your eyes, ears, mouth and genitals (private parts). Wash Face and genitals (private parts)  with your normal soap.  6. Wash thoroughly, paying special attention to the area where your surgery will be performed.  7. Thoroughly rinse your body with warm water from the neck down.  8. DO  NOT shower/wash with your normal soap after using and rinsing off the CHG Soap.  9. Pat yourself dry with a CLEAN TOWEL.  10. Wear CLEAN PAJAMAS to bed the night before surgery, wear comfortable clothes the morning of surgery  11. Place CLEAN SHEETS on your bed the night of your first shower and DO NOT SLEEP WITH PETS.  Day of Surgery:  Do not apply any deodorants/lotions.  Please wear clean clothes to the hospital/surgery center.   Remember to brush your teeth WITH YOUR REGULAR TOOTHPASTE.   Please read over the following fact sheets that you were  given. Pain Booklet, Coughing and Deep Breathing, MRSA Information and Surgical Site Infection Prevention

## 2019-01-01 NOTE — Telephone Encounter (Signed)
Fax to Dr. Murrell Redden office for medication clearance for Coumadin hold for surgery and no Lovenox indicated.

## 2019-01-01 NOTE — Progress Notes (Addendum)
PCP - Dr. Leonarda Salon  Cardiologist - Dr. Claiborne Billings  Chest x-ray - Denies  EKG - 12/28/18 (E)  Stress Test - 12/31/18 (E)   ECHO - 07/2016 (E)  Cardiac Cath - Denies  AICD- na PM- na LOOP- na  Sleep Study - Yes- Positive CPAP - None  LABS- 01/01/2019: CBC, CMP, PT, PTT, T/S, UA, PCR  ASA- LD- 2/4 Coumadin- LD- 1/31   Anesthesia- Yes- cardiac history- PA Ebony Hail made aware.  Pt denies having chest pain, sob, or fever at this time. All instructions explained to the pt, with a verbal understanding of the material. Pt agrees to go over the instructions while at home for a better understanding. The opportunity to ask questions was provided.

## 2019-01-01 NOTE — Anesthesia Preprocedure Evaluation (Addendum)
Anesthesia Evaluation  Patient identified by MRN, date of birth, ID band Patient awake    Reviewed: Allergy & Precautions, H&P , NPO status , Patient's Chart, lab work & pertinent test results, reviewed documented beta blocker date and time   Airway Mallampati: II  TM Distance: >3 FB Neck ROM: Full    Dental no notable dental hx. (+) Teeth Intact, Dental Advisory Given   Pulmonary sleep apnea ,    Pulmonary exam normal breath sounds clear to auscultation       Cardiovascular hypertension, Pt. on medications and Pt. on home beta blockers + CAD, + CABG and + Peripheral Vascular Disease   Rhythm:Regular Rate:Normal     Neuro/Psych Anxiety negative neurological ROS     GI/Hepatic negative GI ROS, Neg liver ROS,   Endo/Other  negative endocrine ROS  Renal/GU negative Renal ROS  negative genitourinary   Musculoskeletal   Abdominal   Peds  Hematology negative hematology ROS (+)   Anesthesia Other Findings   Reproductive/Obstetrics negative OB ROS                           Anesthesia Physical Anesthesia Plan  ASA: III  Anesthesia Plan: General   Post-op Pain Management:    Induction: Intravenous  PONV Risk Score and Plan: 3 and Ondansetron, Dexamethasone and Midazolam  Airway Management Planned: Oral ETT  Additional Equipment: Arterial line  Intra-op Plan:   Post-operative Plan: Extubation in OR  Informed Consent: I have reviewed the patients History and Physical, chart, labs and discussed the procedure including the risks, benefits and alternatives for the proposed anesthesia with the patient or authorized representative who has indicated his/her understanding and acceptance.     Dental advisory given  Plan Discussed with: CRNA  Anesthesia Plan Comments: (PAT note written 01/01/2019 by Myra Gianotti, PA-C. )       Anesthesia Quick Evaluation

## 2019-01-01 NOTE — Progress Notes (Signed)
Anesthesia Chart Review:  Case:  [id number] Date/Time:  01/02/19 1015   Procedure:  ABDOMINAL AORTIC ENDOVASCULAR STENT GRAFT (N/A )   Anesthesia type:  General   Pre-op diagnosis:  ABDOMINAL AORTIC ANEURYSM   Location:  [facility] OR ROOM 16 / [facility]   Surgeon:  Serafina Mitchell, MD      DISCUSSION: Patient is a 73 year old male scheduled for the above procedure.  History includes AAA, CAD (anterior MI, s/p CABG: LIMA-LAD, SVG-D1-OM, SVG-D2, SVG-PDA-PLA, resection of ascending TAA and replacement of AV using 25 mm Homograft valve conduit with reimplantation of coronary arteries 07/29/04), HTN, HLD, DVT/PE 2010, TIA, OSA (no CPAP), never smoker.  - Recent MRI abdomen 12/29/18 (ordered by Dr. Trula Slade) for further evaluation of right kidney lesion showed findings concerning for right renal cell carcinoma and well as recommended imaging follow-up for probable left renal cyst and pancreatic cyst. (Dr. Stephens Shire notes do not yet outline his plan for further evaluation of these findings.)   Per 12/31/18 notation by Almyra Deforest, PA, "No ischemia noted on stress test, previous scar noted on the bottom side of the heart, nothing to reverse. Patient is cleared for surgery tomorrow. I will type up clearance letter and we will manually fax it to Dr. Stephens Shire office since his surgery is tomorrow. Coumadin managed by PCP."  (Per notation by Domenic Moras, RN, medication clearance from Dr. Edrick Oh indicated no Lovenox bridge needed who warfarin on hold for surgery.)  Based on available information, I would anticipate that he can proceed as planned.   VS: BP 140/64   Pulse (!) 51   Temp 36.9 C (Oral)   Resp 16   Ht 5\' 11"  (1.803 m)   Wt 96.4 kg   SpO2 96%   BMI 29.64 kg/m   PROVIDERS: Dione Housekeeper, MD is PCP Shelva Majestic, MD is cardiologist. Last visit 12/28/18 by Almyra Deforest, [place].   LABS: Labs reviewed: Acceptable for surgery. (all labs ordered are listed, but only abnormal results are displayed)  Labs  Reviewed  CBC - Abnormal; Notable for the following components:      Result Value   MCV 101.0 (*)    All other components within normal limits  COMPREHENSIVE METABOLIC PANEL - Abnormal; Notable for the following components:   GFR calc non Af Amer 57 (*)    All other components within normal limits  PROTIME-INR - Abnormal; Notable for the following components:   Prothrombin Time 15.3 (*)    All other components within normal limits  URINALYSIS, ROUTINE W REFLEX MICROSCOPIC - Abnormal; Notable for the following components:   Color, Urine STRAW (*)    Hgb urine dipstick SMALL (*)    All other components within normal limits  SURGICAL PCR SCREEN  APTT  TYPE AND SCREEN    IMAGES: MR abdomen 12/29/18: IMPRESSION: 1. 2.0 x 2.4 x 1.6 cm heterogeneously enhancing lesion extreme lower pole right kidney is compatible with renal cell carcinoma. No evidence for renal vein involvement. No local/regional lymphadenopathy. 2. 4.6 cm complex lesion upper pole left kidney with hemorrhagic/proteinaceous components. This is probably a hemorrhagic/proteinaceous cyst but a degree of irregular enhancement inferiorly in the lesion is not excluded. As such, follow-up MRI without with contrast in 3-6 months is recommended to re-evaluate. 3. 5.2 cm abdominal aortic aneurysm. ADDENDUM: As noted in the body of the report, there is a 6 mm simple appearing cyst in the tail of pancreas. Follow-up MRI in 12 months recommended. This recommendation follows ACR  consensus guidelines: Management of Incidental Pancreatic Cysts: A White Paper of the ACR Incidental Findings Committee. [place] 8250;53:976-734.  CTA chest/abd/pelvis 12/11/18: IMPRESSION: Vascular: - Ascending aortic graft repair is stable in appearance without evidence of complication or pseudoaneurysm. - Maximal diameter of the aortic arch is 3.6. Recommend annual imaging followup by CTA or MRA. This recommendation follows 2010  ACCF/AHA/AATS/ACR/ASA/SCA/SCAI/SIR/STS/SVM Guidelines for the Diagnosis and Management of Patients with Thoracic Aortic Disease. Circulation.2010; 121: L937-T024. Aortic aneurysm NOS (ICD10-I71.9) cm. - Abdominal aortic aneurysm has increased from a maximal diameter of 3.4 cm on the prior study to 5.2 cm on today's study. - Significant narrowing in the left renal artery. 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 This portion is associated with a prominent penetrating atherosclerotic ulcer. 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 Nonvascular: - *An incidental finding of potential clinical significance has been found. * New 1.9 cm enhancing mass at the lower pole of the right kidney. Renal cell carcinoma is not excluded. MRI of the kidneys with contrast is recommended. - Scarring at the right lung base. - Cholelithiasis.   EKG: 12/28/18 ([facility]): SB at 47 bpm   CV: Nuclear stress test 12/31/18:  The left ventricular ejection fraction is normal (55-65%).  Nuclear stress EF: 55%. Septal wall hypokinesis noted.  There was no ST segment deviation noted during stress.  Defect 1: There is a medium defect of moderate severity present in the mid anteroseptal and apical inferior location. This represents old infarct pattern.  Findings consistent with prior myocardial infarction.  This is an intermediate risk study. No ischemia identified. Old infarct pattern.  Carotid U/S 12/17/18: Summary: - Right Carotid: Velocities in the right ICA are consistent with a 1-39% stenosis. - Left Carotid: Velocities in the left ICA are consistent with a 1-39% stenosis. - Vertebrals:  Bilateral vertebral arteries demonstrate antegrade flow. - Subclavians: Normal flow hemodynamics were seen in bilateral subclavian arteries.  Echo 08/12/16: Study Conclusions -  Left ventricle: The cavity size was normal. Wall thickness was   increased in a pattern of mild LVH. Systolic function was normal.   The estimated ejection fraction was in the range of 50% to 55%.   There is akinesis of the mid-apicalanteroseptal myocardium.   Features are consistent with a pseudonormal left ventricular   filling pattern, with concomitant abnormal relaxation and   increased filling pressure (grade 2 diastolic dysfunction). - Aortic valve: An aortic root homograft was present. There was   mild regurgitation. - Left atrium: The atrium was mildly dilated. Impressions: - Akinesis of the distal septum with overall preserved LV function;   grade 2 diastolic dysfunction; mild LVH; s/p AVR with mild AI;   mild LAE.  Last cardiac cath seen was on 07/19/04 prior to CABG/AVR.   Past Medical History:  Diagnosis Date  . AAA (abdominal aortic aneurysm) ([place]) 03/04/13   aortic duplex dopler- when compared tp previous study ther is no change. aneurysmal dilation measuring 3.5 x 3.4 cemtimeters  . Anxiety   . Coronary artery disease 04/23/13   NUC STRESS TEST-EF 52% High risk scan two separate perfusion defects and a suggestion of reveribility in the inferior-inferolateral wall.  Marland Kitchen History of kidney stones   . Hyperlipidemia   . Hypertension 09/06/12   ECHO-EF 45%   . PE (pulmonary embolism)   . Sleep apnea  No cpap  . TIA (transient ischemic attack)     Past Surgical History:  Procedure Laterality Date  . AORTIC VALVE REPLACEMENT     Using a 25 mm homograft with reimplanation of his coronary arteries  . BACK SURGERY    . CORONARY ARTERY BYPASS GRAFT  2005   By Dr Cyndia Bent, He had a LIMA placed to the LAD, a vien to the diagonal, a vein to the marginal, a vein to the 2nd diagonal, a vein  to the PDA, PLA vessel.    MEDICATIONS: . allopurinol (ZYLOPRIM) 300 MG tablet  . aspirin 81 MG tablet  . Coenzyme Q10 (CO Q-10) 200 MG CAPS  . colchicine 0.6 MG tablet  . escitalopram  (LEXAPRO) 10 MG tablet  . furosemide (LASIX) 20 MG tablet  . furosemide (LASIX) 40 MG tablet  . irbesartan-hydrochlorothiazide (AVALIDE) 150-12.5 MG per tablet  . metoprolol succinate (TOPROL-XL) 25 MG 24 hr tablet  . nitroGLYCERIN (NITROSTAT) 0.4 MG SL tablet  . pravastatin (PRAVACHOL) 20 MG tablet  . predniSONE (DELTASONE) 10 MG tablet  . warfarin (COUMADIN) 5 MG tablet   No current facility-administered medications for this encounter.   Prednisone is PRN gout flare. He is not taking Nitro.    Myra Gianotti, PA-C Surgical Short Stay/Anesthesiology [facility] Phone [phone] [facility] Phone [phone] 01/01/2019 3:30 PM

## 2019-01-01 NOTE — Progress Notes (Signed)
No ischemia noted on stress test, previous scar noted on the bottom side of the heart, nothing to reverse. Patient is cleared for surgery tomorrow. I will type up clearance letter and we will manually fax it to Dr. Stephens Shire office since his surgery is tomorrow. Coumadin managed by PCP

## 2019-01-02 ENCOUNTER — Inpatient Hospital Stay (HOSPITAL_COMMUNITY): Payer: Medicare Other | Admitting: Certified Registered Nurse Anesthetist

## 2019-01-02 ENCOUNTER — Encounter (HOSPITAL_COMMUNITY)
Admission: RE | Disposition: A | Discharge status: Home / Self Care | Payer: Self-pay | Source: Home / Self Care | Attending: Surgery

## 2019-01-02 ENCOUNTER — Inpatient Hospital Stay (HOSPITAL_COMMUNITY): Payer: Medicare Other

## 2019-01-02 ENCOUNTER — Encounter (HOSPITAL_COMMUNITY): Payer: Self-pay

## 2019-01-02 ENCOUNTER — Inpatient Hospital Stay (HOSPITAL_COMMUNITY): Payer: Medicare Other | Admitting: Vascular Surgery

## 2019-01-02 ENCOUNTER — Inpatient Hospital Stay (HOSPITAL_COMMUNITY)
Admission: RE | Admit: 2019-01-02 | Discharge: 2019-01-03 | DRG: 269 | Disposition: A | Discharge status: Home / Self Care | Payer: Medicare Other | Attending: Surgery | Admitting: Surgery

## 2019-01-02 DIAGNOSIS — Z86718 Personal history of other venous thrombosis and embolism: Secondary | ICD-10-CM | POA: Diagnosis not present

## 2019-01-02 DIAGNOSIS — Z7952 Long term (current) use of systemic steroids: Secondary | ICD-10-CM | POA: Diagnosis not present

## 2019-01-02 DIAGNOSIS — Z79899 Other long term (current) drug therapy: Secondary | ICD-10-CM

## 2019-01-02 DIAGNOSIS — I739 Peripheral vascular disease, unspecified: Secondary | ICD-10-CM | POA: Diagnosis present

## 2019-01-02 DIAGNOSIS — Z86711 Personal history of pulmonary embolism: Secondary | ICD-10-CM | POA: Diagnosis not present

## 2019-01-02 DIAGNOSIS — E7800 Pure hypercholesterolemia, unspecified: Secondary | ICD-10-CM | POA: Diagnosis present

## 2019-01-02 DIAGNOSIS — N2889 Other specified disorders of kidney and ureter: Secondary | ICD-10-CM | POA: Diagnosis present

## 2019-01-02 DIAGNOSIS — G4733 Obstructive sleep apnea (adult) (pediatric): Secondary | ICD-10-CM | POA: Diagnosis present

## 2019-01-02 DIAGNOSIS — Z7982 Long term (current) use of aspirin: Secondary | ICD-10-CM | POA: Diagnosis not present

## 2019-01-02 DIAGNOSIS — I252 Old myocardial infarction: Secondary | ICD-10-CM | POA: Diagnosis not present

## 2019-01-02 DIAGNOSIS — Z7901 Long term (current) use of anticoagulants: Secondary | ICD-10-CM

## 2019-01-02 DIAGNOSIS — I10 Essential (primary) hypertension: Secondary | ICD-10-CM | POA: Diagnosis present

## 2019-01-02 DIAGNOSIS — Z8673 Personal history of transient ischemic attack (TIA), and cerebral infarction without residual deficits: Secondary | ICD-10-CM

## 2019-01-02 DIAGNOSIS — I2510 Atherosclerotic heart disease of native coronary artery without angina pectoris: Secondary | ICD-10-CM | POA: Diagnosis present

## 2019-01-02 DIAGNOSIS — E782 Mixed hyperlipidemia: Secondary | ICD-10-CM | POA: Diagnosis present

## 2019-01-02 DIAGNOSIS — I714 Abdominal aortic aneurysm, without rupture, unspecified: Secondary | ICD-10-CM | POA: Diagnosis present

## 2019-01-02 DIAGNOSIS — Z951 Presence of aortocoronary bypass graft: Secondary | ICD-10-CM | POA: Diagnosis not present

## 2019-01-02 DIAGNOSIS — Z9889 Other specified postprocedural states: Secondary | ICD-10-CM

## 2019-01-02 HISTORY — PX: ABDOMINAL AORTIC ENDOVASCULAR STENT GRAFT: SHX5707

## 2019-01-02 LAB — PREPARE RBC (CROSSMATCH)

## 2019-01-02 LAB — BASIC METABOLIC PANEL
ANION GAP: 11 (ref 5–15)
BUN: 18 mg/dL (ref 8–23)
CO2: 25 mmol/L (ref 22–32)
Calcium: 8.7 mg/dL — ABNORMAL LOW (ref 8.9–10.3)
Chloride: 105 mmol/L (ref 98–111)
Creatinine, Ser: 1.22 mg/dL (ref 0.61–1.24)
GFR calc Af Amer: >60 mL/min (ref >60)
GFR calc non Af Amer: 58 mL/min — ABNORMAL LOW (ref >60)
Glucose, Bld: 107 mg/dL — ABNORMAL HIGH (ref 70–99)
Potassium: 3.6 mmol/L (ref 3.5–5.1)
Sodium: 141 mmol/L (ref 135–145)

## 2019-01-02 LAB — CBC
HEMATOCRIT: 43.4 % (ref 39.0–52.0)
Hemoglobin: 14.5 g/dL (ref 13.0–17.0)
MCH: 33.3 pg (ref 26.0–34.0)
MCHC: 33.4 g/dL (ref 30.0–36.0)
MCV: 99.5 fL (ref 80.0–100.0)
NRBC: 0 % (ref 0.0–0.2)
Platelets: 147 10*3/uL — ABNORMAL LOW (ref 150–400)
RBC: 4.36 MIL/uL (ref 4.22–5.81)
RDW: 12.6 % (ref 11.5–15.5)
WBC: 10 10*3/uL (ref 4.0–10.5)

## 2019-01-02 LAB — PROTIME-INR
INR: 1.23
Prothrombin Time: 15.3 seconds — ABNORMAL HIGH (ref 11.4–15.2)

## 2019-01-02 LAB — APTT: aPTT: 30 seconds (ref 24–36)

## 2019-01-02 LAB — MAGNESIUM: Magnesium: 1.8 mg/dL (ref 1.7–2.4)

## 2019-01-02 SURGERY — INSERTION, ENDOVASCULAR STENT GRAFT, AORTA, ABDOMINAL
Anesthesia: General | Site: Abdomen

## 2019-01-02 MED ORDER — 0.9 % SODIUM CHLORIDE (POUR BTL) OPTIME
TOPICAL | Status: DC | PRN
  Administered 2019-01-02: 1000 mL

## 2019-01-02 MED ORDER — SUGAMMADEX SODIUM 200 MG/2ML IV SOLN
INTRAVENOUS | Status: DC | PRN
  Administered 2019-01-02: 200 mg via INTRAVENOUS

## 2019-01-02 MED ORDER — ALUM & MAG HYDROXIDE-SIMETH 200-200-20 MG/5ML PO SUSP: 15.0000 mL | ORAL | Status: DC | PRN

## 2019-01-02 MED ORDER — POTASSIUM CHLORIDE CRYS ER 20 MEQ PO TBCR
20.0000 meq | EXTENDED_RELEASE_TABLET | Freq: Every day | ORAL | Status: AC | PRN
  Administered 2019-01-03: 20 meq via ORAL
  Filled 2019-01-02: qty 1

## 2019-01-02 MED ORDER — HEPARIN SODIUM (PORCINE) 1000 UNIT/ML IJ SOLN
INTRAMUSCULAR | Status: AC
  Filled 2019-01-02: qty 2

## 2019-01-02 MED ORDER — PROTAMINE SULFATE 10 MG/ML IV SOLN
INTRAVENOUS | Status: AC
  Filled 2019-01-02: qty 5

## 2019-01-02 MED ORDER — CEFAZOLIN SODIUM-DEXTROSE 2-4 GM/100ML-% IV SOLN
2.0000 g | INTRAVENOUS | Status: AC
  Administered 2019-01-02: 2 g via INTRAVENOUS
  Filled 2019-01-02: qty 100

## 2019-01-02 MED ORDER — ETOMIDATE 2 MG/ML IV SOLN
INTRAVENOUS | Status: AC
  Filled 2019-01-02: qty 20

## 2019-01-02 MED ORDER — DEXAMETHASONE SODIUM PHOSPHATE 10 MG/ML IJ SOLN
INTRAMUSCULAR | Status: AC
  Filled 2019-01-02: qty 1

## 2019-01-02 MED ORDER — ESCITALOPRAM OXALATE 10 MG PO TABS
5.0000 mg | ORAL_TABLET | Freq: Every day | ORAL | Status: DC
  Administered 2019-01-03: 5 mg via ORAL
  Filled 2019-01-02: qty 1

## 2019-01-02 MED ORDER — SODIUM CHLORIDE 0.9 % IV SOLN
INTRAVENOUS | Status: DC | PRN
  Administered 2019-01-02: 500 mL

## 2019-01-02 MED ORDER — METOPROLOL TARTRATE 5 MG/5ML IV SOLN: 2.0000 mg | INTRAVENOUS | Status: DC | PRN

## 2019-01-02 MED ORDER — HEPARIN SODIUM (PORCINE) 1000 UNIT/ML IJ SOLN
INTRAMUSCULAR | Status: AC
  Filled 2019-01-02: qty 1

## 2019-01-02 MED ORDER — LACTATED RINGERS IV SOLN: INTRAVENOUS | Status: DC

## 2019-01-02 MED ORDER — GUAIFENESIN-DM 100-10 MG/5ML PO SYRP: 15.0000 mL | ORAL_SOLUTION | ORAL | Status: DC | PRN

## 2019-01-02 MED ORDER — ROCURONIUM BROMIDE 50 MG/5ML IV SOSY
PREFILLED_SYRINGE | INTRAVENOUS | Status: DC | PRN
  Administered 2019-01-02: 20 mg via INTRAVENOUS
  Administered 2019-01-02: 10 mg via INTRAVENOUS
  Administered 2019-01-02: 50 mg via INTRAVENOUS
  Administered 2019-01-02: 15 mg via INTRAVENOUS

## 2019-01-02 MED ORDER — PROPOFOL 10 MG/ML IV BOLUS
INTRAVENOUS | Status: DC | PRN
  Administered 2019-01-02: 120 mg via INTRAVENOUS

## 2019-01-02 MED ORDER — ACETAMINOPHEN 500 MG PO TABS: 1000.0000 mg | ORAL_TABLET | Freq: Once | ORAL | Status: DC

## 2019-01-02 MED ORDER — SENNOSIDES-DOCUSATE SODIUM 8.6-50 MG PO TABS: 1.0000 | ORAL_TABLET | Freq: Every evening | ORAL | Status: DC | PRN

## 2019-01-02 MED ORDER — HYDROMORPHONE HCL 1 MG/ML IJ SOLN: 0.5000 mg | INTRAMUSCULAR | Status: DC | PRN

## 2019-01-02 MED ORDER — DEXAMETHASONE SODIUM PHOSPHATE 10 MG/ML IJ SOLN
INTRAMUSCULAR | Status: DC | PRN
  Administered 2019-01-02: 4 mg via INTRAVENOUS

## 2019-01-02 MED ORDER — GLYCOPYRROLATE 0.2 MG/ML IJ SOLN
INTRAMUSCULAR | Status: DC | PRN
  Administered 2019-01-02: 0.1 mg via INTRAVENOUS

## 2019-01-02 MED ORDER — SODIUM CHLORIDE 0.9 % IV SOLN
INTRAVENOUS | Status: DC
  Administered 2019-01-02 (×2): via INTRAVENOUS

## 2019-01-02 MED ORDER — LACTATED RINGERS IV SOLN
INTRAVENOUS | Status: DC | PRN
  Administered 2019-01-02: 10:00:00 via INTRAVENOUS

## 2019-01-02 MED ORDER — PHENOL 1.4 % MT LIQD: 1.0000 | OROMUCOSAL | Status: DC | PRN

## 2019-01-02 MED ORDER — HYDROCHLOROTHIAZIDE 10 MG/ML ORAL SUSPENSION
6.2500 mg | Freq: Every day | ORAL | Status: DC
  Filled 2019-01-02: qty 1.25

## 2019-01-02 MED ORDER — ONDANSETRON HCL 4 MG/2ML IJ SOLN
INTRAMUSCULAR | Status: AC
  Filled 2019-01-02: qty 2

## 2019-01-02 MED ORDER — ACETAMINOPHEN 325 MG RE SUPP: 325.0000 mg | RECTAL | Status: DC | PRN

## 2019-01-02 MED ORDER — COLCHICINE 0.6 MG PO TABS: 0.6000 mg | ORAL_TABLET | Freq: Every day | ORAL | Status: DC | PRN

## 2019-01-02 MED ORDER — HYDROMORPHONE HCL 1 MG/ML IJ SOLN: 0.2500 mg | INTRAMUSCULAR | Status: DC | PRN

## 2019-01-02 MED ORDER — SODIUM CHLORIDE 0.9% IV SOLUTION: Freq: Once | INTRAVENOUS | Status: DC

## 2019-01-02 MED ORDER — EPHEDRINE 5 MG/ML INJ
INTRAVENOUS | Status: AC
  Filled 2019-01-02: qty 10

## 2019-01-02 MED ORDER — FENTANYL CITRATE (PF) 250 MCG/5ML IJ SOLN
INTRAMUSCULAR | Status: AC
  Filled 2019-01-02: qty 5

## 2019-01-02 MED ORDER — OXYCODONE HCL 5 MG PO TABS: 5.0000 mg | ORAL_TABLET | ORAL | Status: DC | PRN

## 2019-01-02 MED ORDER — FENTANYL CITRATE (PF) 100 MCG/2ML IJ SOLN
INTRAMUSCULAR | Status: DC | PRN
  Administered 2019-01-02 (×3): 50 ug via INTRAVENOUS

## 2019-01-02 MED ORDER — DOCUSATE SODIUM 100 MG PO CAPS: 100.0000 mg | ORAL_CAPSULE | Freq: Every day | ORAL | Status: DC

## 2019-01-02 MED ORDER — SUCCINYLCHOLINE CHLORIDE 200 MG/10ML IV SOSY
PREFILLED_SYRINGE | INTRAVENOUS | Status: AC
  Filled 2019-01-02: qty 10

## 2019-01-02 MED ORDER — PREDNISONE 10 MG PO TABS: 10.0000 mg | ORAL_TABLET | ORAL | Status: DC | PRN

## 2019-01-02 MED ORDER — LABETALOL HCL 5 MG/ML IV SOLN
INTRAVENOUS | Status: AC
  Filled 2019-01-02: qty 4

## 2019-01-02 MED ORDER — IODIXANOL 320 MG/ML IV SOLN
INTRAVENOUS | Status: DC | PRN
  Administered 2019-01-02: 57.56 mL via INTRA_ARTERIAL

## 2019-01-02 MED ORDER — IRBESARTAN 150 MG PO TABS: 75.0000 mg | ORAL_TABLET | Freq: Every day | ORAL | Status: DC

## 2019-01-02 MED ORDER — FUROSEMIDE 20 MG PO TABS: 20.0000 mg | ORAL_TABLET | Freq: Every day | ORAL | Status: DC

## 2019-01-02 MED ORDER — LIDOCAINE 2% (20 MG/ML) 5 ML SYRINGE
INTRAMUSCULAR | Status: AC
  Filled 2019-01-02: qty 5

## 2019-01-02 MED ORDER — MAGNESIUM SULFATE 2 GM/50ML IV SOLN: 2.0000 g | Freq: Every day | INTRAVENOUS | Status: DC | PRN

## 2019-01-02 MED ORDER — METOPROLOL SUCCINATE ER 25 MG PO TB24
12.5000 mg | ORAL_TABLET | Freq: Every day | ORAL | Status: DC
  Administered 2019-01-03: 12.5 mg via ORAL
  Filled 2019-01-02: qty 1

## 2019-01-02 MED ORDER — CHLORHEXIDINE GLUCONATE 4 % EX LIQD: 60.0000 mL | Freq: Once | CUTANEOUS | Status: DC

## 2019-01-02 MED ORDER — HYDRALAZINE HCL 20 MG/ML IJ SOLN: 5.0000 mg | INTRAMUSCULAR | Status: DC | PRN

## 2019-01-02 MED ORDER — CEFAZOLIN SODIUM-DEXTROSE 2-4 GM/100ML-% IV SOLN
2.0000 g | Freq: Three times a day (TID) | INTRAVENOUS | Status: AC
  Administered 2019-01-02: 2 g via INTRAVENOUS
  Filled 2019-01-02 (×2): qty 100

## 2019-01-02 MED ORDER — ROCURONIUM BROMIDE 50 MG/5ML IV SOSY
PREFILLED_SYRINGE | INTRAVENOUS | Status: AC
  Filled 2019-01-02: qty 10

## 2019-01-02 MED ORDER — PROPOFOL 10 MG/ML IV BOLUS
INTRAVENOUS | Status: AC
  Filled 2019-01-02: qty 20

## 2019-01-02 MED ORDER — HEPARIN SODIUM (PORCINE) 1000 UNIT/ML IJ SOLN
INTRAMUSCULAR | Status: DC | PRN
  Administered 2019-01-02: 1000 [IU] via INTRAVENOUS
  Administered 2019-01-02: 10000 [IU] via INTRAVENOUS

## 2019-01-02 MED ORDER — PRAVASTATIN SODIUM 10 MG PO TABS: 20.0000 mg | ORAL_TABLET | Freq: Every day | ORAL | Status: DC

## 2019-01-02 MED ORDER — SODIUM CHLORIDE 0.9 % IV SOLN
INTRAVENOUS | Status: DC
  Administered 2019-01-02 (×2): via INTRAVENOUS

## 2019-01-02 MED ORDER — PROTAMINE SULFATE 10 MG/ML IV SOLN
INTRAVENOUS | Status: DC | PRN
  Administered 2019-01-02: 50 mg via INTRAVENOUS

## 2019-01-02 MED ORDER — LIDOCAINE 2% (20 MG/ML) 5 ML SYRINGE
INTRAMUSCULAR | Status: DC | PRN
  Administered 2019-01-02: 60 mg via INTRAVENOUS

## 2019-01-02 MED ORDER — SODIUM CHLORIDE 0.9 % IV SOLN
INTRAVENOUS | Status: DC | PRN
  Administered 2019-01-02: 25 ug/min via INTRAVENOUS

## 2019-01-02 MED ORDER — NITROGLYCERIN 0.3 MG SL SUBL: 0.3000 mg | SUBLINGUAL_TABLET | SUBLINGUAL | Status: DC | PRN

## 2019-01-02 MED ORDER — ARTIFICIAL TEARS OPHTHALMIC OINT
TOPICAL_OINTMENT | OPHTHALMIC | Status: AC
  Filled 2019-01-02: qty 7

## 2019-01-02 MED ORDER — EPHEDRINE SULFATE 50 MG/ML IJ SOLN
INTRAMUSCULAR | Status: DC | PRN
  Administered 2019-01-02 (×2): 5 mg via INTRAVENOUS

## 2019-01-02 MED ORDER — EPHEDRINE 5 MG/ML INJ
INTRAVENOUS | Status: AC
  Filled 2019-01-02: qty 20

## 2019-01-02 MED ORDER — PANTOPRAZOLE SODIUM 40 MG PO TBEC
40.0000 mg | DELAYED_RELEASE_TABLET | Freq: Every day | ORAL | Status: DC
  Administered 2019-01-03: 40 mg via ORAL
  Filled 2019-01-02: qty 1

## 2019-01-02 MED ORDER — IRBESARTAN-HYDROCHLOROTHIAZIDE 150-12.5 MG PO TABS: 0.5000 | ORAL_TABLET | Freq: Every day | ORAL | Status: DC

## 2019-01-02 MED ORDER — ASPIRIN EC 81 MG PO TBEC
81.0000 mg | DELAYED_RELEASE_TABLET | Freq: Every day | ORAL | Status: DC
  Administered 2019-01-03: 81 mg via ORAL
  Filled 2019-01-02: qty 1

## 2019-01-02 MED ORDER — ACETAMINOPHEN 325 MG PO TABS
325.0000 mg | ORAL_TABLET | ORAL | Status: DC | PRN
  Administered 2019-01-03: 650 mg via ORAL
  Filled 2019-01-02: qty 2

## 2019-01-02 MED ORDER — LABETALOL HCL 5 MG/ML IV SOLN
10.0000 mg | INTRAVENOUS | Status: DC | PRN
  Administered 2019-01-02: 10 mg via INTRAVENOUS

## 2019-01-02 MED ORDER — SODIUM CHLORIDE 0.9 % IV SOLN: 500.0000 mL | Freq: Once | INTRAVENOUS | Status: DC | PRN

## 2019-01-02 MED ORDER — ONDANSETRON HCL 4 MG/2ML IJ SOLN
INTRAMUSCULAR | Status: DC | PRN
  Administered 2019-01-02: 4 mg via INTRAVENOUS

## 2019-01-02 MED ORDER — SODIUM CHLORIDE 0.9 % IV SOLN
INTRAVENOUS | Status: AC
  Filled 2019-01-02: qty 1.2

## 2019-01-02 MED ORDER — PHENYLEPHRINE 40 MCG/ML (10ML) SYRINGE FOR IV PUSH (FOR BLOOD PRESSURE SUPPORT)
PREFILLED_SYRINGE | INTRAVENOUS | Status: AC
  Filled 2019-01-02: qty 10

## 2019-01-02 MED ORDER — ENOXAPARIN SODIUM 40 MG/0.4ML ~~LOC~~ SOLN: 40.0000 mg | SUBCUTANEOUS | Status: DC

## 2019-01-02 MED ORDER — ONDANSETRON HCL 4 MG/2ML IJ SOLN: 4.0000 mg | Freq: Four times a day (QID) | INTRAMUSCULAR | Status: DC | PRN

## 2019-01-02 MED ORDER — ALLOPURINOL 300 MG PO TABS
300.0000 mg | ORAL_TABLET | Freq: Every day | ORAL | Status: DC
  Administered 2019-01-03: 300 mg via ORAL
  Filled 2019-01-02: qty 1

## 2019-01-02 MED ORDER — BISACODYL 5 MG PO TBEC: 5.0000 mg | DELAYED_RELEASE_TABLET | Freq: Every day | ORAL | Status: DC | PRN

## 2019-01-02 SURGICAL SUPPLY — 73 items
ADH SKN CLS APL DERMABOND .7 (GAUZE/BANDAGES/DRESSINGS) ×2
BLADE CLIPPER SURG (BLADE) ×3 IMPLANT
CANISTER SUCT 3000ML PPV (MISCELLANEOUS) ×3 IMPLANT
CATH BEACON 5.038 65CM KMP-01 (CATHETERS) ×3 IMPLANT
CATH OMNI FLUSH .035X70CM (CATHETERS) ×3 IMPLANT
COVER DOME SNAP 22 D (MISCELLANEOUS) ×2 IMPLANT
COVER PROBE W GEL 5X96 (DRAPES) ×3 IMPLANT
COVER WAND RF STERILE (DRAPES) ×3 IMPLANT
DERMABOND ADVANCED (GAUZE/BANDAGES/DRESSINGS) ×4
DERMABOND ADVANCED .7 DNX12 (GAUZE/BANDAGES/DRESSINGS) ×1 IMPLANT
DEVICE CLOSURE PERCLS PRGLD 6F (VASCULAR PRODUCTS) IMPLANT
DEVICE TORQUE H2O (MISCELLANEOUS) ×3 IMPLANT
DRAPE ZERO GRAVITY STERILE (DRAPES) ×3 IMPLANT
DRESSING OPSITE X SMALL 2X3 (GAUZE/BANDAGES/DRESSINGS) ×4 IMPLANT
DRSG TEGADERM 2-3/8X2-3/4 SM (GAUZE/BANDAGES/DRESSINGS) ×6 IMPLANT
DRYSEAL FLEXSHEATH 16FR 33CM (SHEATH) ×2
DRYSEAL FLEXSHEATH 18FR 33CM (SHEATH) ×2
ELECT CAUTERY BLADE 6.4 (BLADE) ×3 IMPLANT
ELECT REM PT RETURN 9FT ADLT (ELECTROSURGICAL) ×6
ELECTRODE REM PT RTRN 9FT ADLT (ELECTROSURGICAL) ×2 IMPLANT
EXCLUDER TNK 28X14.5MMX12CM (Endovascular Graft) IMPLANT
EXCLUDER TRUNK 28X14.5MMX12CM (Endovascular Graft) ×3 IMPLANT
GAUZE SPONGE 2X2 8PLY STRL LF (GAUZE/BANDAGES/DRESSINGS) ×2 IMPLANT
GLOVE BIOGEL PI IND STRL 6.5 (GLOVE) IMPLANT
GLOVE BIOGEL PI IND STRL 7.0 (GLOVE) IMPLANT
GLOVE BIOGEL PI IND STRL 7.5 (GLOVE) ×1 IMPLANT
GLOVE BIOGEL PI INDICATOR 6.5 (GLOVE) ×6
GLOVE BIOGEL PI INDICATOR 7.0 (GLOVE) ×2
GLOVE BIOGEL PI INDICATOR 7.5 (GLOVE) ×4
GLOVE ECLIPSE 7.0 STRL STRAW (GLOVE) ×2 IMPLANT
GLOVE SURG SS PI 7.5 STRL IVOR (GLOVE) ×3 IMPLANT
GOWN STRL REUS W/ TWL LRG LVL3 (GOWN DISPOSABLE) ×2 IMPLANT
GOWN STRL REUS W/ TWL XL LVL3 (GOWN DISPOSABLE) ×2 IMPLANT
GOWN STRL REUS W/TWL LRG LVL3 (GOWN DISPOSABLE) ×6
GOWN STRL REUS W/TWL XL LVL3 (GOWN DISPOSABLE) ×6
GRAFT BALLN CATH 65CM (STENTS) ×1 IMPLANT
GRAFT EXCLUDER AORTIC 28X3.3CM (Endovascular Graft) ×4 IMPLANT
GUIDEWIRE ANGLED .035X150CM (WIRE) ×2 IMPLANT
KIT BASIN OR (CUSTOM PROCEDURE TRAY) ×3 IMPLANT
KIT TURNOVER KIT B (KITS) ×3 IMPLANT
LEG CONTRALATERAL 27X10 (Vascular Products) ×2 IMPLANT
LEG CONTRALATERAL 27X14 (Vascular Products) ×2 IMPLANT
NDL PERC 18GX7CM (NEEDLE) IMPLANT
NEEDLE PERC 18GX7CM (NEEDLE) IMPLANT
NS IRRIG 1000ML POUR BTL (IV SOLUTION) ×3 IMPLANT
PACK ENDOVASCULAR (PACKS) ×3 IMPLANT
PAD ARMBOARD 7.5X6 YLW CONV (MISCELLANEOUS) ×6 IMPLANT
PENCIL BUTTON HOLSTER BLD 10FT (ELECTRODE) ×3 IMPLANT
PERCLOSE PROGLIDE 6F (VASCULAR PRODUCTS) ×12
SET MICROPUNCTURE 5F STIFF (MISCELLANEOUS) ×3 IMPLANT
SHEATH BRITE TIP 8FR 23CM (SHEATH) ×3 IMPLANT
SHEATH DRYSEAL FLEX 16FR 33CM (SHEATH) IMPLANT
SHEATH DRYSEAL FLEX 18FR 33CM (SHEATH) IMPLANT
SHEATH PINNACLE 8F 10CM (SHEATH) ×3 IMPLANT
SHIELD RADPAD SCOOP 12X17 (MISCELLANEOUS) ×6 IMPLANT
SLEEVE ISOL F/PACE RF HD COVER (MISCELLANEOUS) ×2 IMPLANT
SPONGE GAUZE 2X2 8PLY STER LF (GAUZE/BANDAGES/DRESSINGS) ×2
SPONGE GAUZE 2X2 8PLY STRL LF (GAUZE/BANDAGES/DRESSINGS) ×2 IMPLANT
SPONGE GAUZE 2X2 STER 10/PKG (GAUZE/BANDAGES/DRESSINGS) ×4
STENT GRAFT BALLN CATH 65CM (STENTS) ×2
STOPCOCK MORSE 400PSI 3WAY (MISCELLANEOUS) ×5 IMPLANT
SUT PROLENE 5 0 C 1 24 (SUTURE) IMPLANT
SUT VIC AB 2-0 CT1 27 (SUTURE)
SUT VIC AB 2-0 CT1 TAPERPNT 27 (SUTURE) IMPLANT
SUT VIC AB 3-0 SH 27 (SUTURE)
SUT VIC AB 3-0 SH 27X BRD (SUTURE) IMPLANT
SUT VICRYL 4-0 PS2 18IN ABS (SUTURE) IMPLANT
SYR 30ML LL (SYRINGE) ×3 IMPLANT
TOWEL GREEN STERILE (TOWEL DISPOSABLE) ×3 IMPLANT
TRAY FOLEY MTR SLVR 16FR STAT (SET/KITS/TRAYS/PACK) ×3 IMPLANT
TUBING HIGH PRESSURE 120CM (CONNECTOR) ×6 IMPLANT
WIRE AMPLATZ SS-J .035X180CM (WIRE) ×6 IMPLANT
WIRE BENTSON .035X145CM (WIRE) ×6 IMPLANT

## 2019-01-02 NOTE — Anesthesia Procedure Notes (Signed)
Procedure Name: Intubation Date/Time: 01/02/2019 11:11 AM Performed by: Inda Coke, CRNA Pre-anesthesia Checklist: Patient identified, Emergency Drugs available, Suction available and Patient being monitored Patient Re-evaluated:Patient Re-evaluated prior to induction Oxygen Delivery Method: Circle System Utilized Preoxygenation: Pre-oxygenation with 100% oxygen Induction Type: IV induction Ventilation: Mask ventilation without difficulty and Oral airway inserted - appropriate to patient size Laryngoscope Size: Mac and 4 Grade View: Grade II Tube type: Oral Tube size: 7.5 mm Number of attempts: 1 Airway Equipment and Method: Stylet and Oral airway Placement Confirmation: ETT inserted through vocal cords under direct vision,  positive ETCO2 and breath sounds checked- equal and bilateral Secured at: 23 cm Tube secured with: Tape Dental Injury: Teeth and Oropharynx as per pre-operative assessment

## 2019-01-02 NOTE — Anesthesia Procedure Notes (Signed)
Arterial Line Insertion Start/End2/03/2019 10:30 AM, 01/02/2019 10:35 AM Performed by: Inda Coke, CRNA, CRNA  Patient location: Pre-op. Preanesthetic checklist: patient identified, IV checked, site marked, risks and benefits discussed, surgical consent, monitors and equipment checked, pre-op evaluation, timeout performed and anesthesia consent Lidocaine 1% used for infiltration Left, radial was placed Catheter size: 20 G Hand hygiene performed  and maximum sterile barriers used  Allen's test indicative of satisfactory collateral circulation Attempts: 1 Procedure performed without using ultrasound guided technique. Ultrasound Notes:anatomy identified Following insertion, dressing applied and Biopatch. Post procedure assessment: normal  Patient tolerated the procedure well with no immediate complications.

## 2019-01-02 NOTE — Interval H&P Note (Signed)
History and Physical Interval Note:  01/02/2019 10:52 AM  Kenneth Scott  has presented today for surgery, with the diagnosis of ABDOMINAL AORTIC ANEURYSM  The various methods of treatment have been discussed with the patient and family. After consideration of risks, benefits and other options for treatment, the patient has consented to  Procedure(s): ABDOMINAL AORTIC ENDOVASCULAR STENT GRAFT (N/A) as a surgical intervention .  The patient's history has been reviewed, patient examined, no change in status, stable for surgery.  I have reviewed the patient's chart and labs.  Questions were answered to the patient's satisfaction.     Annamarie Major

## 2019-01-02 NOTE — Anesthesia Postprocedure Evaluation (Signed)
Anesthesia Post Note  Patient: Kenneth Scott  Procedure(s) Performed: ABDOMINAL AORTIC ENDOVASCULAR STENT GRAFT (N/A Abdomen)     Patient location during evaluation: PACU Anesthesia Type: General Level of consciousness: awake and alert Pain management: pain level controlled Vital Signs Assessment: post-procedure vital signs reviewed and stable Respiratory status: spontaneous breathing, nonlabored ventilation, respiratory function stable and patient connected to nasal cannula oxygen Cardiovascular status: blood pressure returned to baseline and stable Postop Assessment: no apparent nausea or vomiting Anesthetic complications: no    Last Vitals:  Vitals:   01/02/19 1400 01/02/19 1415  BP:    Pulse: 77 74  Resp: 18 18  Temp:    SpO2: 96% 94%    Last Pain:  Vitals:   01/02/19 1415  TempSrc:   PainSc: 0-No pain                 Maekayla Giorgio,W. EDMOND

## 2019-01-02 NOTE — Progress Notes (Signed)
I plan on discussing the results of the MRI of his kidney with the patient tomorrow prior to discharge.  He will need to be scheduled to have a outpatient appointment with urology.  Annamarie Major

## 2019-01-02 NOTE — Progress Notes (Addendum)
  Day of Surgery Note    Subjective:  Feels wonderful now that the procedure is over.  No complaints   Vitals:   01/02/19 1326 01/02/19 1341  BP: (!) 114/56 123/63  Pulse: 71 81  Resp: 20 17  Temp: 97.7 F (36.5 C)   SpO2: 97% 96%    Incisions:   Bilateral groins are soft without hematoma Extremities:  Palpable right DP/PT and left PT pulses Cardiac:  regular Lungs:  Non labored Abdomen:  Soft, NT/ND   Assessment/Plan:  This is a 73 y.o. male who is s/p  EVAR  -pt doing well post op-pt has palpable pedal pulses -to 4 east when bed available -anticipate dc tomorrow if night is uneventful   Leontine Locket, PA-C 01/02/2019 1:59 PM [phone]

## 2019-01-02 NOTE — Op Note (Signed)
Patient name: Kenneth Scott MRN: [id number] DOB: 12/27/1945 Sex: male  01/02/2019 Pre-operative Diagnosis: Abdominal aortic aneurysm Post-operative diagnosis:  Same Surgeon:  Annamarie Major Assistants: Laurence Slate Procedure:   #1: Endovascular repair of infrarenal abdominal aortic aneurysm   #2: Bilateral ultrasound-guided percutaneous access   #3: Proximal extension x2   #4: Abdominal aortogram   #5: Catheter in aorta x2 Anesthesia: General Blood Loss: Minimal Specimens: None  Findings: Complete exclusion.  The patient had a angulated neck.  When the device was initially deployed, the re-constraining tie burst and so I could not manipulate the main body.  Fortunately it deployed lower than the lower left renal artery.  I ended up placing 2 cuffs and order to get the proximal portion of coverage near the left renal artery.  Devices used: Main body was primary right Gore 28 x 14 x 12.  Ipsilateral ex right extension was a 27 x 10.  Contralateral left extension was a Gore 27 x 14.  2 proximal cuffs were placed.  Each were a 28.5 x 3.3.  Indications: The patient has a 5.2 cm infrarenal abdominal aortic aneurysm he comes in today for repair.  Procedure:  The patient was identified in the holding area and taken to [facility]  The patient was then placed supine on the table. general anesthesia was administered.  The patient was prepped and draped in the usual sterile fashion.  A time out was called and antibiotics were administered.  Ultrasound was used to evaluate bilateral common femoral arteries which are widely patent with minimal calcification.  A skin nick was made bilaterally with a #11 blade.  Using ultrasound guidance, bilateral common femoral arteries were cannulated with a micropuncture needle.  An 018 wire was advanced without resistance and a micropuncture sheath was placed.  A Bentson wire was then inserted and the subcutaneous tract was dilated with an 8 [place] dilator.   Pro-glide devices were deployed at the 11:00 and 1 o'clock position for pre-closure and 8 French sheaths were placed bilaterally.  The patient was fully heparinized.  Using a Berenstein 2 catheter, Amplatz superstiff wires were inserted into the descending thoracic aorta.  An 50 French sheath was advanced into the aorta from the right side and a 16 French sheath was advanced in the aorta on the left side.  The main body device was prepared on the back table.  This was a Gore 28 x 14 x 12 device.  It was advanced up the right side into the infrarenal abdominal aorta.  An Omni Flush catheter was advanced up the left side.  An abdominal aortogram was then performed locating the lower left renal artery.  The device was then deployed.  Upon deployment I could feel the constraining loop pop.  Fortunately, the device migrated distally below the renal arteries.  I could not re-constrained the device to manipulate it.  I then placed a second wire into the dry seal sheath on the left.  I cannulated the gate using a Berenstein 2 catheter and a Glidewire.  The Berenstein 2 catheter was inserted into the main body.  The Glidewire was removed and Amplatz superstiff wire was placed in the other Amplatz superstiff wire was removed from the left-sided sheath.  A Omni Flush catheter was able to be freely rotated within the main body, confirming successful cannulation.  The image detector was then brought down to a right anterior oblique position and a retrograde injection was performed to the sheath  in the left groin locating the left hypogastric artery.  A contralateral extension was then prepared on the back table.  The dilator was inserted through the sheath on the left and inserted into the gate.  The contralateral extension which was a Gore 27 x 14 device was then inserted and deployed landing at the level of the left hypogastric artery.  Next, the remaining portion of the ipsilateral limb was deployed.  The image detector was  rotated to a left anterior oblique position and a retrograde injection was performed through the sheath in the right groin locating the origin of the right hypogastric artery.  The ipsilateral limb was prepared on the back table.  This was a Gore 27 x 10 device.  It was inserted through the sheath into the ipsilateral limb and then deployed landing at the level of the right hypogastric artery.  Next, a MOB  balloon was used to mold the proximal and distal attachment sites as well as device overlap.  A Omni Flush catheter was then advanced up the right side and abdominal aortogram was then performed confirming that the proximal edge of the main body was well below the left renal artery.  There was exclusion of the aneurysm sac however I felt that I needed to build up the proximal portion of the graft.  From the left side I inserted a proximal cuff.  This is a 28.5 x 3.3 device.  It was deployed at the level of the left renal artery however this did migrate distally because of the angulation of the neck.  It was molded to confirmation with a MOB balloon..  An aortogram was performed which showed I still has more room below the lower left renal artery than had hoped.  A second proximal cuff was then inserted.  Again this was a 28.5 x 3.3 device.  This was then deployed to the level of left renal artery and molded with the MOB balloon.  Final arteriogram was performed which showed successful exclusion of the aneurysm sac with continued patency of both renal arteries and both external and internal iliac arteries.  Bentson wires were then inserted bilaterally.  The sheaths were removed and the arteriotomy sites were closed by securing the pro-glide devices.  50 mg of protamine was used to reverse the heparin.  Manual pressure was held on both groins for 5 minutes until they were hemostatic.  Cautery was used to close the subcutaneous tissue.  Dermabond was applied.  The patient had brisk pedal Doppler signals.  He was  successfully extubated and taken to recovery in stable condition.   Disposition: To PACU stable.   Theotis Burrow, M.D. [facility] Office: [phone] Pager:  [phone]

## 2019-01-02 NOTE — Transfer of Care (Signed)
Immediate Anesthesia Transfer of Care Note  Patient: Kenneth Scott  Procedure(s) Performed: ABDOMINAL AORTIC ENDOVASCULAR STENT GRAFT (N/A Abdomen)  Patient Location: PACU  Anesthesia Type:General  Level of Consciousness: awake and alert   Airway & Oxygen Therapy: Patient Spontanous Breathing and Patient connected to nasal cannula oxygen  Post-op Assessment: Report given to RN and Post -op Vital signs reviewed and stable  Post vital signs: Reviewed and stable  Last Vitals:  Vitals Value Taken Time  BP 114/56 01/02/2019  1:26 PM  Temp    Pulse 77 01/02/2019  1:31 PM  Resp 20 01/02/2019  1:31 PM  SpO2 97 % 01/02/2019  1:31 PM  Vitals shown include unvalidated device data.  Last Pain:  Vitals:   01/02/19 0903  TempSrc:   PainSc: 0-No pain         Complications: No apparent anesthesia complications

## 2019-01-03 ENCOUNTER — Encounter (HOSPITAL_COMMUNITY): Payer: Self-pay | Admitting: General Practice

## 2019-01-03 LAB — BASIC METABOLIC PANEL
Anion gap: 14 (ref 5–15)
BUN: 18 mg/dL (ref 8–23)
CO2: 24 mmol/L (ref 22–32)
Calcium: 8.4 mg/dL — ABNORMAL LOW (ref 8.9–10.3)
Chloride: 103 mmol/L (ref 98–111)
Creatinine, Ser: 1.11 mg/dL (ref 0.61–1.24)
GFR calc Af Amer: >60 mL/min (ref >60)
GFR calc non Af Amer: >60 mL/min (ref >60)
GLUCOSE: 100 mg/dL — AB (ref 70–99)
Potassium: 3.4 mmol/L — ABNORMAL LOW (ref 3.5–5.1)
Sodium: 141 mmol/L (ref 135–145)

## 2019-01-03 LAB — CBC
HEMATOCRIT: 40.3 % (ref 39.0–52.0)
Hemoglobin: 13.7 g/dL (ref 13.0–17.0)
MCH: 34 pg (ref 26.0–34.0)
MCHC: 34 g/dL (ref 30.0–36.0)
MCV: 100 fL (ref 80.0–100.0)
Platelets: 146 10*3/uL — ABNORMAL LOW (ref 150–400)
RBC: 4.03 MIL/uL — ABNORMAL LOW (ref 4.22–5.81)
RDW: 12.9 % (ref 11.5–15.5)
WBC: 12.1 10*3/uL — ABNORMAL HIGH (ref 4.0–10.5)
nRBC: 0 % (ref 0.0–0.2)

## 2019-01-03 LAB — POCT ACTIVATED CLOTTING TIME: Activated Clotting Time: 224 seconds

## 2019-01-03 MED ORDER — WARFARIN SODIUM 5 MG PO TABS: 5.0000 mg | ORAL_TABLET | Freq: Every day | ORAL | Status: DC

## 2019-01-03 MED ORDER — WARFARIN - PHYSICIAN DOSING INPATIENT: Freq: Every day | Status: DC

## 2019-01-03 MED ORDER — OXYCODONE HCL 5 MG PO TABS: 5.0000 mg | ORAL_TABLET | Freq: Four times a day (QID) | ORAL | 0 refills | Status: DC | PRN

## 2019-01-03 NOTE — Discharge Summary (Signed)
[facility] Discharge Summary   Kenneth Scott 09/01/1946 73 y.o. male  MRN: [id number]  Admission Date: 01/02/2019  Discharge Date: 01/03/2019  Physician: Serafina Mitchell, MD  Admission Diagnosis: ABDOMINAL AORTIC ANEURYSM   HPI:   This is a 73 y.o. male who isreferred for evaluation of an abdominal aortic aneurysm that measured 4.8 cm on ultrasound. He is without abdominal pain. Patient has a history of coronary artery disease, status post myocardial infarction with PCI of his LAD in 2005. He later went on to CABG. He had valve replacement with aortic root repair for a sending aortic aneurysm. The patient has a history of DVT and pulmonary embolism in 2010. He is on chronic anticoagulation. He also has a history of a TIA. His hypercholesterolemia is managed with a statin. His hypertension is medically managed as well. He is a non-smoker.  Hospital Course:  The patient was admitted to the hospital and taken to the operating room on 01/02/2019 and underwent: [facility]AR    Findings: Complete exclusion.  The patient had a angulated neck.  When the device was initially deployed, the re-constraining tie burst and so I could not manipulate the main body.  Fortunately it deployed lower than the lower left renal artery.  I ended up placing 2 cuffs and order to get the proximal portion of coverage near the left renal artery.  The pt tolerated the procedure well and was transported to the PACU in good condition.   By POD 1, he was doing well.  He will be set up for an appointment [facility] mass.    The remainder of the hospital course consisted of increasing mobilization and increasing intake of solids without difficulty.  CBC    Component Value Date/Time   WBC 12.1 (H) 01/03/2019 0614   RBC 4.03 (L) 01/03/2019 0614   HGB 13.7 01/03/2019 0614   HCT 40.3 01/03/2019 0614   PLT 146 (L) 01/03/2019 0614   MCV 100.0 01/03/2019 0614   MCH 34.0 01/03/2019 0614   MCHC  34.0 01/03/2019 0614   RDW 12.9 01/03/2019 0614   LYMPHSABS 1.4 04/21/2009 1250   MONOABS 1.1 (H) 04/21/2009 1250   EOSABS 0.2 04/21/2009 1250   BASOSABS 0.0 04/21/2009 1250    BMET    Component Value Date/Time   NA 141 01/03/2019 0614   K 3.4 (L) 01/03/2019 0614   CL 103 01/03/2019 0614   CO2 24 01/03/2019 0614   GLUCOSE 100 (H) 01/03/2019 0614   BUN 18 01/03/2019 0614   CREATININE 1.11 01/03/2019 0614   CREATININE 1.38 (H) 07/29/2016 0845   CALCIUM 8.4 (L) 01/03/2019 0614   GFRNONAA >60 01/03/2019 0614   GFRAA >60 01/03/2019 5427       Discharge Instructions    Discharge patient   Complete by:  As directed    Discharge after pt has eaten and voided.  Thanks   Discharge disposition:  01-Home or Self Care   Discharge patient date:  01/03/2019      Discharge Diagnosis:  ABDOMINAL AORTIC ANEURYSM  Secondary Diagnosis: Patient Active Problem List   Diagnosis Date Noted  . AAA (abdominal aortic aneurysm) ([place]) 01/02/2019  . OSA on CPAP 03/23/2014  . Severe obstructive sleep apnea 10/29/2013  . Hypoxia 09/06/2013  . CAD in native artery 05/30/2013  . 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 S/P AVR 05/30/2013  . Hypertension 05/30/2013  . Mixed hyperlipidemia 05/30/2013  . DVT (deep venous thrombosis) ([place]) 05/30/2013  . Pulmonary  embolism and infarction ([place]) 05/30/2013  . TIA (transient ischemic attack) 05/30/2013  . Gout 05/30/2013   Past Medical History:  Diagnosis Date  . AAA (abdominal aortic aneurysm) ([place]) 03/04/13   aortic duplex dopler- when compared tp previous study ther is no change. aneurysmal dilation measuring 3.5 x 3.4 cemtimeters  . Anxiety   . Coronary artery disease 04/23/13   NUC STRESS TEST-EF 52% High risk scan two separate perfusion defects and a suggestion of reveribility in the inferior-inferolateral wall.  Marland Kitchen History of kidney stones   . Hyperlipidemia   . Hypertension 09/06/12   ECHO-EF 45%   . PE (pulmonary embolism)   . Sleep  apnea    No cpap  . TIA (transient ischemic attack)      Allergies as of 01/03/2019   No Known Allergies     Medication List    TAKE these medications   allopurinol 300 MG tablet Commonly known as:  ZYLOPRIM Take 300 mg by mouth daily.   aspirin 81 MG tablet Take 81 mg by mouth daily.   Co Q-10 200 MG Caps Take 200 mg by mouth daily.   colchicine 0.6 MG tablet Take 0.6 mg by mouth daily as needed (gout).   escitalopram 10 MG tablet Commonly known as:  LEXAPRO Take 5 mg by mouth daily.   furosemide 40 MG tablet Commonly known as:  LASIX Take 20 mg by mouth daily. What changed:  Another medication with the same name was removed. Continue taking this medication, and follow the directions you see here.   irbesartan-hydrochlorothiazide 150-12.5 MG tablet Commonly known as:  AVALIDE Take 0.5 tablets by mouth daily.   metoprolol succinate 25 MG 24 hr tablet Commonly known as:  TOPROL-XL Take 0.5 tablets (12.5 mg total) by mouth daily.   nitroGLYCERIN 0.4 MG SL tablet Commonly known as:  NITROSTAT PLACE 1 TABLET (0.4 MG TOTAL) UNDER THE TONGUE EVERY 5 (FIVE) MINUTES AS NEEDED FOR CHEST PAIN.   oxyCODONE 5 MG immediate release tablet Commonly known as:  Oxy IR/ROXICODONE Take 1 tablet (5 mg total) by mouth every 6 (six) hours as needed for moderate pain.   pravastatin 20 MG tablet Commonly known as:  PRAVACHOL Take 20 mg by mouth daily.   predniSONE 10 MG tablet Commonly known as:  DELTASONE Take 10 mg by mouth as needed (gout flare up).   warfarin 5 MG tablet Commonly known as:  COUMADIN Take 5 mg by mouth daily.       Discharge Instructions:  [facility]  Discharge Instructions Endovascular Aortic Aneurysm Repair  Please refer to the following instructions for your post-procedure care. Your surgeon or Physician Assistant will discuss any changes with you.  Activity  You are encouraged to walk as much as you can. You can  slowly return to normal activities but must avoid strenuous activity and heavy lifting until your doctor tells you it's OK. Avoid activities such as vacuuming or swinging a gold club. It is normal to feel tired for several weeks after your surgery. Do not drive until your doctor gives the OK and you are no longer taking prescription pain medications. It is also normal to have difficulty with sleep habits, eating, and bowel movements after surgery. These will go away with time.  Bathing/Showering  You may shower after you go home. If you have an incision, do not soak in a bathtub, hot tub, or swim until the incision heals completely.  Incision Care  Shower every  day. Clean your incision with mild soap and water. Pat the area dry with a clean towel. You do not need a bandage unless otherwise instructed. Do not apply any ointments or creams to your incision. If you clothing is irritating, you may cover your incision with a dry gauze pad.  Diet  Resume your normal diet. There are no special food restrictions following this procedure. A low fat/low cholesterol diet is recommended for all patients with vascular disease. In order to heal from your surgery, it is CRITICAL to get adequate nutrition. Your body requires vitamins, minerals, and protein. Vegetables are the best source of vitamins and minerals. Vegetables also provide the perfect balance of protein. Processed food has little nutritional value, so try to avoid this.  Medications  Resume taking all of your medications unless your doctor or Physician Assistnat tells you not to. If your incision is causing pain, you may take over-the-counter pain relievers such as acetaminophen (Tylenol). If you were prescribed a stronger pain medication, please be aware these medications can cause nausea and constipation. Prevent nausea by taking the medication with a snack or meal. Avoid constipation by drinking plenty of fluids and eating foods with a high amount  of fiber, such as fruits, vegetables, and grains.  Do not take Tylenol if you are taking prescription pain medications.   Follow up  [place] office will schedule a follow-up appointment with a C.T. scan 3-4 weeks after your surgery.  Please call us immediately for any of the following conditions  . Severe or worsening pain in your legs or feet or in your abdomen back or chest. . Increased pain, redness, drainage (pus) from your incision sit. . Increased abdominal pain, bloating, nausea, vomiting or persistent diarrhea. . Fever of 101 degrees or higher. . Swelling in your leg (s), .  Reduce your risk of vascular disease  .Stop smoking. If you would like help call [facility] at 1-800-QUIT-NOW 9178354132) or [facility] at (732)252-3774. .Manage your cholesterol .Maintain a desired weight .Control your diabetes .Keep your blood pressure down  If you have questions, please call the office at [phone].    Prescriptions given: 1.  Roxicodone #6 No Refill  Disposition: home  Patient's condition: is Good  Follow up: 1. Dr. Trula Slade in 4 weeks with CTA protocol 2. [facility] first available appt in [place].   Leontine Locket, PA-C [facility] [phone] 01/03/2019  8:48 AM   - For VQI Registry use - Post-op:  Time to Extubation: [x]  In OR, [ ]  < 12 hrs, [ ]  12-24 hrs, [ ]  >=24 hrs Vasopressors Req. Post-op: No MI: No., [ ]  Troponin only, [ ]  EKG or Clinical New Arrhythmia: No CHF: No ICU Stay: 1 day in stepdown  Transfusion: No     If yes, n/a units given  Complications: Resp failure: No., [ ]  Pneumonia, [ ]  Ventilator Chg in renal function: No., [ ]  Inc. Cr > 0.5, [ ]  Temp. Dialysis,  [ ]  Permanent dialysis Leg ischemia: No., no Surgery needed, [ ]  Yes, Surgery needed,  [ ]  Amputation Bowel ischemia: No., [ ]  Medical Rx, [ ]  Surgical Rx Wound complication: No., [ ]  Superficial separation/infection, [ ]  Return to OR Return to OR:  No  Return to OR for bleeding: No Stroke: No., [ ]  Minor, [ ]  Major  Discharge medications: Statin use:  Yes  ASA use:  Yes  Plavix use:  No  Beta blocker use:  Yes  ARB use:  Yes  ACEI use:  No CCB use:  No Coumadin:  Yes.

## 2019-01-03 NOTE — Discharge Instructions (Signed)
[facility]   Discharge Instructions  Endovascular Aortic Aneurysm Repair  Please refer to the following instructions for your post-procedure care. Your surgeon or Physician Assistant will discuss any changes with you.  Activity  You are encouraged to walk as much as you can. You can slowly return to normal activities but must avoid strenuous activity and heavy lifting until your doctor tells you it's OK. Avoid activities such as vacuuming or swinging a gold club. It is normal to feel tired for several weeks after your surgery. Do not drive until your doctor gives the OK and you are no longer taking prescription pain medications. It is also normal to have difficulty with sleep habits, eating, and bowel movements after surgery. These will go away with time.  Bathing/Showering  You may shower after you go home. If you have an incision, do not soak in a bathtub, hot tub, or swim until the incision heals completely.  Incision Care  Shower every day. Clean your incision with mild soap and water. Pat the area dry with a clean towel. You do not need a bandage unless otherwise instructed. Do not apply any ointments or creams to your incision. If you clothing is irritating, you may cover your incision with a dry gauze pad.  Diet  Resume your normal diet. There are no special food restrictions following this procedure. A low fat/low cholesterol diet is recommended for all patients with vascular disease. In order to heal from your surgery, it is CRITICAL to get adequate nutrition. Your body requires vitamins, minerals, and protein. Vegetables are the best source of vitamins and minerals. Vegetables also provide the perfect balance of protein. Processed food has little nutritional value, so try to avoid this.  Medications  Resume taking all of your medications unless your doctor or nurse practitioner tells you not to. If your incision is causing pain, you may take  over-the-counter pain relievers such as acetaminophen (Tylenol). If you were prescribed a stronger pain medication, please be aware these medications can cause nausea and constipation. Prevent nausea by taking the medication with a snack or meal. Avoid constipation by drinking plenty of fluids and eating foods with a high amount of fiber, such as fruits, vegetables, and grains. Do not take Tylenol if you are taking prescription pain medications.   Follow up  [place] office will schedule a follow-up appointment with a C.T. scan 3-4 weeks after your surgery.  Please call us immediately for any of the following conditions  Severe or worsening pain in your legs or feet or in your abdomen back or chest. Increased pain, redness, drainage (pus) from your incision sit. Increased abdominal pain, bloating, nausea, vomiting or persistent diarrhea. Fever of 101 degrees or higher. Swelling in your leg (s),  Reduce your risk of vascular disease  Stop smoking. If you would like help call [facility] at 1-800-QUIT-NOW 810-281-9863) or [facility] at (202)223-6019. Manage your cholesterol Maintain a desired weight Control your diabetes Keep your blood pressure down  If you have questions, please call the office at [phone].      Information on my medicine - Coumadin   (Warfarin)  Why was Coumadin prescribed for you? Coumadin was prescribed for you because you have a blood clot or a medical condition that can cause an increased risk of forming blood clots. Blood clots can cause serious health problems by blocking the flow of blood to the heart, lung, or brain. Coumadin can prevent harmful blood clots from forming.  As a reminder your indication for Coumadin is:   Pulmonary Embolism Treatment  What test will check on my response to Coumadin? While on Coumadin (warfarin) you will need to have an INR test regularly to ensure that your dose is keeping you in the desired range. The INR  (international normalized ratio) number is calculated from the result of the laboratory test called prothrombin time (PT).  If an INR APPOINTMENT HAS NOT ALREADY BEEN MADE FOR YOU please schedule an appointment to have this lab work done by your health care provider within 7 days. Your INR goal is usually a number between:  2 to 3 or your provider may give you a more narrow range like 2-2.5.  Ask your health care provider during an office visit what your goal INR is.  What  do you need to  know  About  COUMADIN? Take Coumadin (warfarin) exactly as prescribed by your healthcare provider about the same time each day.  DO NOT stop taking without talking to the doctor who prescribed the medication.  Stopping without other blood clot prevention medication to take the place of Coumadin may increase your risk of developing a new clot or stroke.  Get refills before you run out.  What do you do if you miss a dose? If you miss a dose, take it as soon as you remember on the same day then continue your regularly scheduled regimen the next day.  Do not take two doses of Coumadin at the same time.  Important Safety Information A possible side effect of Coumadin (Warfarin) is an increased risk of bleeding. You should call your healthcare provider right away if you experience any of the following: ? Bleeding from an injury or your nose that does not stop. ? Unusual colored urine (red or dark brown) or unusual colored stools (red or black). ? Unusual bruising for unknown reasons. ? A serious fall or if you hit your head (even if there is no bleeding).  Some foods or medicines interact with Coumadin (warfarin) and might alter your response to warfarin. To help avoid this: ? Eat a balanced diet, maintaining a consistent amount of Vitamin K. ? Notify your provider about major diet changes you plan to make. ? Avoid alcohol or limit your intake to 1 drink for women and 2 drinks for men per day. (1 drink is 5 oz.  wine, 12 oz. beer, or 1.5 oz. liquor.)  Make sure that ANY health care provider who prescribes medication for you knows that you are taking Coumadin (warfarin).  Also make sure the healthcare provider who is monitoring your Coumadin knows when you have started a new medication including herbals and non-prescription products.  Coumadin (Warfarin)  Major Drug Interactions  Increased Warfarin Effect Decreased Warfarin Effect  Alcohol (large quantities) Antibiotics (esp. Septra/Bactrim, Flagyl, Cipro) Amiodarone (Cordarone) Aspirin (ASA) Cimetidine (Tagamet) Megestrol (Megace) NSAIDs (ibuprofen, naproxen, etc.) Piroxicam (Feldene) Propafenone (Rythmol SR) Propranolol (Inderal) Isoniazid (INH) Posaconazole (Noxafil) Barbiturates (Phenobarbital) Carbamazepine (Tegretol) Chlordiazepoxide (Librium) Cholestyramine (Questran) Griseofulvin Oral Contraceptives Rifampin Sucralfate (Carafate) Vitamin K   Coumadin (Warfarin) Major Herbal Interactions  Increased Warfarin Effect Decreased Warfarin Effect  Garlic Ginseng Ginkgo biloba Coenzyme Q10 Green tea St. Johns wort    Coumadin (Warfarin) FOOD Interactions  Eat a consistent number of servings per week of foods HIGH in Vitamin K (1 serving =  cup)  Collards (cooked, or boiled & drained) Kale (cooked, or boiled & drained) Mustard greens (cooked, or boiled & drained) Parsley *serving size  only =  cup Spinach (cooked, or boiled & drained) Swiss chard (cooked, or boiled & drained) Turnip greens (cooked, or boiled & drained)  Eat a consistent number of servings per week of foods MEDIUM-HIGH in Vitamin K (1 serving = 1 cup)  Asparagus (cooked, or boiled & drained) Broccoli (cooked, boiled & drained, or raw & chopped) Brussel sprouts (cooked, or boiled & drained) *serving size only =  cup Lettuce, raw (green leaf, endive, romaine) Spinach, raw Turnip greens, raw & chopped   These websites have more information on Coumadin  (warfarin):  FailFactory.se; VeganReport.com.au;

## 2019-01-03 NOTE — Progress Notes (Addendum)
  Progress Note    01/03/2019 7:25 AM 1 Day Post-Op  Subjective:  Feels good this morning.  Says he's a little sore in his groins.   Afebrile HR 50's-70's NSR 970'Y-637'C systolic 58% 8FO2DX  Vitals:   01/02/19 1957 01/03/19 0515  BP: 106/64 123/64  Pulse: 70 75  Resp: 19 20  Temp: 97.7 F (36.5 C) 97.7 F (36.5 C)  SpO2: 95% 95%    Physical Exam: Cardiac:  regular Lungs:  Non labored Incisions:  Bilateral groins soft without hematoma Extremities:  +palpable right PT and left DP/PT Abdomen:  Soft/NT/ND  CBC    Component Value Date/Time   WBC 10.0 01/02/2019 1410   RBC 4.36 01/02/2019 1410   HGB 14.5 01/02/2019 1410   HCT 43.4 01/02/2019 1410   PLT 147 (L) 01/02/2019 1410   MCV 99.5 01/02/2019 1410   MCH 33.3 01/02/2019 1410   MCHC 33.4 01/02/2019 1410   RDW 12.6 01/02/2019 1410   LYMPHSABS 1.4 04/21/2009 1250   MONOABS 1.1 (H) 04/21/2009 1250   EOSABS 0.2 04/21/2009 1250   BASOSABS 0.0 04/21/2009 1250    BMET    Component Value Date/Time   NA 141 01/03/2019 0614   K 3.4 (L) 01/03/2019 0614   CL 103 01/03/2019 0614   CO2 24 01/03/2019 0614   GLUCOSE 100 (H) 01/03/2019 0614   BUN 18 01/03/2019 0614   CREATININE 1.11 01/03/2019 0614   CREATININE 1.38 (H) 07/29/2016 0845   CALCIUM 8.4 (L) 01/03/2019 0614   GFRNONAA >60 01/03/2019 0614   GFRAA >60 01/03/2019 0614    INR    Component Value Date/Time   INR 1.23 01/02/2019 1410     Intake/Output Summary (Last 24 hours) at 01/03/2019 0725 Last data filed at 01/03/2019 0607 Gross per 24 hour  Intake 3082.67 ml  Output 1175 ml  Net 1907.67 ml     Assessment:  74 y.o. male is s/p:  EVAR 1 Day Post-Op  Plan: -pt doing well this am.  He walked >955ft yesterday. -creatinine looks good this am -no evidence of bleeding-will restart coumadin today -discharge home today and f/u with Dr. Trula Slade in 4 weeks with CTA. -will ask our office to arrange appointment with [facility] in [facility]  for renal mass consistent with RCC. -DVT prophylaxis:  Restart coumadin today-discussed with pt to go to Dr. Edrick Oh on Monday to get INR check.  He is in agreement.  He said he was told he did not need Lovenox bridge since he had been on coumadin for so long.    Leontine Locket, PA-C [facility] [phone] 01/03/2019 7:25 AM  I agree with the above.  I have seen and evaluated the patient.  I discussed the MRI findings of a renal cell cancer.  He will be referred to urology for further evaluation.  Annamarie Major

## 2019-01-03 NOTE — Progress Notes (Signed)
Ambulated in hall 970 ft. room air stand by assist tolerated well no complaints from patient. Pt set up in bed. Will continue to monitor.

## 2019-01-04 ENCOUNTER — Encounter (HOSPITAL_COMMUNITY): Payer: Self-pay | Admitting: Surgery

## 2019-01-04 LAB — TYPE AND SCREEN
ABO/RH(D): O POS
Antibody Screen: NEGATIVE
UNIT DIVISION: 0
Unit division: 0

## 2019-01-04 LAB — BPAM RBC
Blood Product Expiration Date: 202003092359
Blood Product Expiration Date: 202003092359
ISSUE DATE / TIME: 202002051132
ISSUE DATE / TIME: 202002051132
Unit Type and Rh: 5100
Unit Type and Rh: 5100

## 2019-01-14 ENCOUNTER — Other Ambulatory Visit: Payer: Self-pay

## 2019-01-14 DIAGNOSIS — I714 Abdominal aortic aneurysm, without rupture, unspecified: Secondary | ICD-10-CM

## 2019-01-24 ENCOUNTER — Telehealth: Payer: Self-pay | Admitting: Surgery

## 2019-01-24 NOTE — Telephone Encounter (Signed)
-----   Message from Pennelope Bracken sent at 01/23/2019  9:27 AM EST -----   ----- Message ----- From: Ulyses Amor, PA-C Sent: 01/02/2019   1:20 PM EST To: [facility] Admin Pool, [facility] Charge Pool   S/P EVAR f/u with Dr. Trula Slade in 4 weeks need CTA Abd/pelvis

## 2019-01-24 NOTE — Telephone Encounter (Signed)
sch appt lvm 01/28/2019 9am CTA (AP) 1130am p/o MD

## 2019-01-28 ENCOUNTER — Ambulatory Visit (INDEPENDENT_AMBULATORY_CARE_PROVIDER_SITE_OTHER): Payer: Medicare Other | Admitting: Surgery

## 2019-01-28 ENCOUNTER — Other Ambulatory Visit: Payer: Self-pay

## 2019-01-28 ENCOUNTER — Encounter: Payer: Self-pay | Admitting: Surgery

## 2019-01-28 ENCOUNTER — Ambulatory Visit (HOSPITAL_COMMUNITY)
Admission: RE | Admit: 2019-01-28 | Discharge: 2019-01-28 | Disposition: A | Discharge status: Home / Self Care | Payer: Medicare Other | Source: Ambulatory Visit | Attending: Surgery | Admitting: Surgery

## 2019-01-28 VITALS — BP 116/69 | HR 67 | Temp 99.3°F | Resp 20 | Ht 71.0 in | Wt 209.4 lb

## 2019-01-28 DIAGNOSIS — I714 Abdominal aortic aneurysm, without rupture, unspecified: Secondary | ICD-10-CM

## 2019-01-28 MED ORDER — IOHEXOL 350 MG/ML SOLN
100.0000 mL | Freq: Once | INTRAVENOUS | Status: AC | PRN
  Administered 2019-01-28: 100 mL via INTRAVENOUS

## 2019-01-28 NOTE — Progress Notes (Signed)
Patient name: Kenneth Scott MRN: [id number] DOB: 1946-05-28 Sex: male  REASON FOR VISIT:     post op  HISTORY OF PRESENT ILLNESS:   KUZEY Scott is a 73 y.o. 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  He has had no complaints since his operation.  Patient has a history of myocardial infarction and PCI of his LAD in 2000.  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  He has had a valve replacement.  His history of DVT and PE in 2010.  He is on chronic anticoagulation.  He takes a statin for hypercholesterolemia.  He is a non-smoker.  He is medically managed for hypertension.  CURRENT MEDICATIONS:    Current Outpatient Medications  Medication Sig Dispense Refill  . allopurinol (ZYLOPRIM) 300 MG tablet Take 300 mg by mouth daily.   4  . aspirin 81 MG tablet Take 81 mg by mouth daily.    . Coenzyme Q10 (CO Q-10) 200 MG CAPS Take 200 mg by mouth daily.    . colchicine 0.6 MG tablet Take 0.6 mg by mouth daily as needed (gout).     Marland Kitchen escitalopram (LEXAPRO) 10 MG tablet Take 5 mg by mouth daily.     . furosemide (LASIX) 40 MG tablet Take 20 mg by mouth daily.    . irbesartan-hydrochlorothiazide (AVALIDE) 150-12.5 MG per tablet Take 0.5 tablets by mouth daily.     . metoprolol succinate (TOPROL-XL) 25 MG 24 hr tablet Take 0.5 tablets (12.5 mg total) by mouth daily. 45 tablet 1  . nitroGLYCERIN (NITROSTAT) 0.4 MG SL tablet PLACE 1 TABLET (0.4 MG TOTAL) UNDER THE TONGUE EVERY 5 (FIVE) MINUTES AS NEEDED FOR CHEST PAIN. (Patient not taking: Reported on 12/28/2018) 25 tablet 6  . oxyCODONE (OXY IR/ROXICODONE) 5 MG immediate release tablet Take 1 tablet (5 mg total) by mouth every 6 (six) hours as needed for moderate pain. 6 tablet 0  . pravastatin (PRAVACHOL) 20 MG tablet Take 20 mg by mouth daily.   4  . predniSONE (DELTASONE) 10 MG tablet Take 10 mg by mouth as needed (gout flare up).     . warfarin (COUMADIN) 5 MG  tablet Take 5 mg by mouth daily.      No current facility-administered medications for this visit.     REVIEW OF SYSTEMS:   [X]  denotes positive finding, [ ]  denotes negative finding Cardiac  Comments:  Chest pain or chest pressure:    Shortness of breath upon exertion:    Short of breath when lying flat:    Irregular heart rhythm:    Constitutional    Fever or chills:      PHYSICAL EXAM:   There were no vitals filed for this visit.  GENERAL: The patient is a well-nourished male, in no acute distress. The vital signs are documented above. CARDIOVASCULAR: There is a regular rate and rhythm. PULMONARY: Non-labored respirations Both groin incisions have healed nicely  STUDIES:   CT angiogram: Slight increase in aneurysm sac size.  No evidence of endoleak.  MEDICAL ISSUES:   Right renal cyst: This is compatible with renal cell carcinoma based on MRI.  He has seen urology.  This is going to be managed nonoperatively.  Pancreatic cyst: Follow-up MRI in 12 months.  I will need to order this when he returns in 6 months  AAA: Because of the increase in size of aneurysm without evidence of endoleak, I will repeat his  CT scan in 6 months.  Annamarie Major, MD [facility] [phone] Pager [phone]

## 2019-04-30 IMAGING — CT CT ANGIO CHEST
2 of 7 series · 15 of 36 positions shown · IV contrast (iopamidol)
Comparison: 04/06/2009

CLINICAL DATA: Abdominal aortic aneurysm. Thoracic aortic aneurysm.
Follow-up.

EXAM:
CT ANGIOGRAPHY CHEST, ABDOMEN AND PELVIS
TECHNIQUE: Multidetector CT imaging through the chest, abdomen and pelvis was
performed using the standard protocol during bolus administration of
intravenous contrast. Multiplanar reconstructed images and MIPs were
obtained and reviewed to evaluate the vascular anatomy.
CONTRAST:  75mL [id number] IOPAMIDOL ([id number]) INJECTION 76%

[Series 5: cta cap aneurysm 2.00 bv36 s3 ax axial arterial · axial · arterial · 0.77mm/px · z∈[+1212,+1850]mm · 14 of 363 slices shown]
[im 22/363  lung]
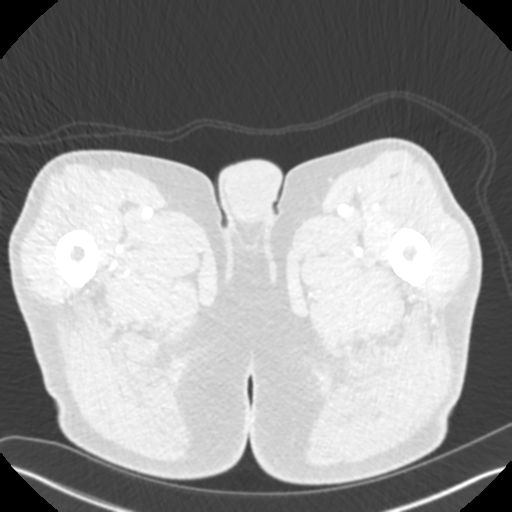
[im 43/363  mediastinal]
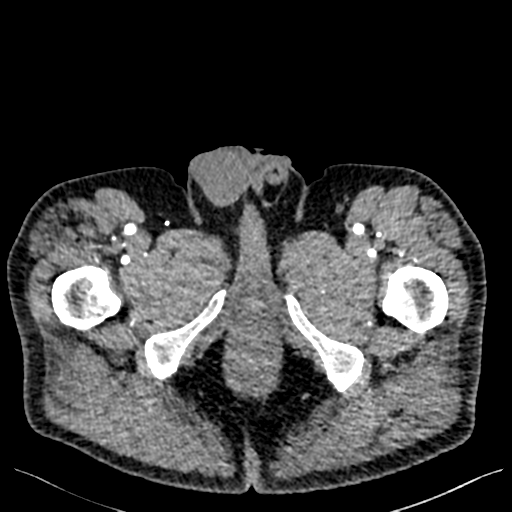
[im 64/363  lung]
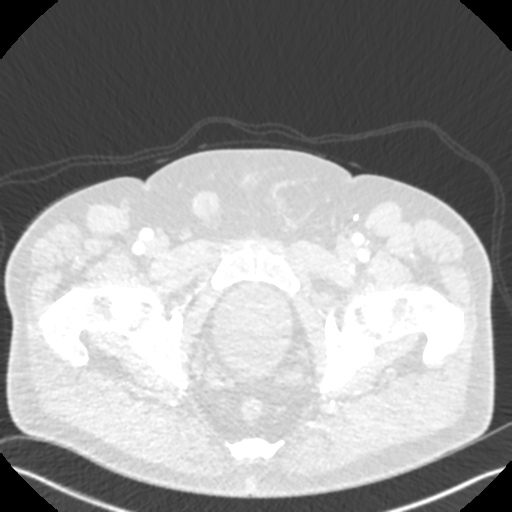
[im 107/363  mediastinal]
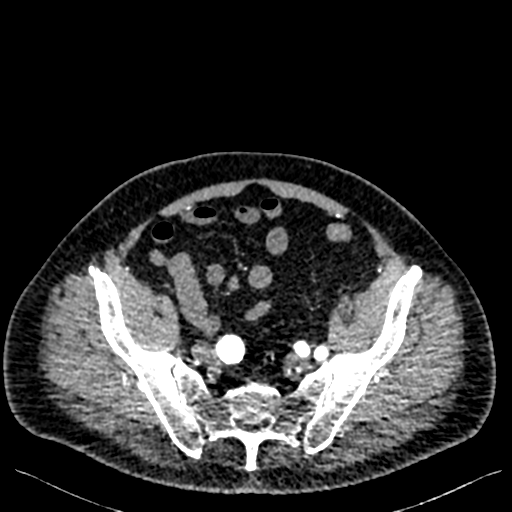
[im 128/363  lung]
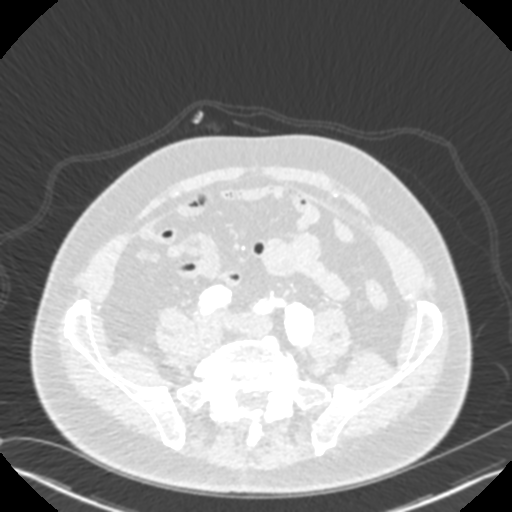
[im 150/363  mediastinal]
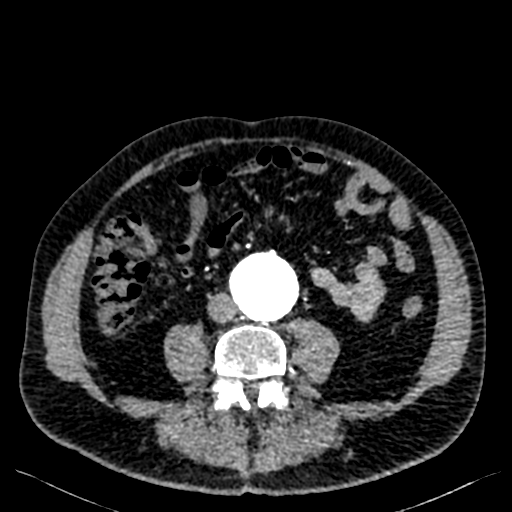
[im 171/363  lung]
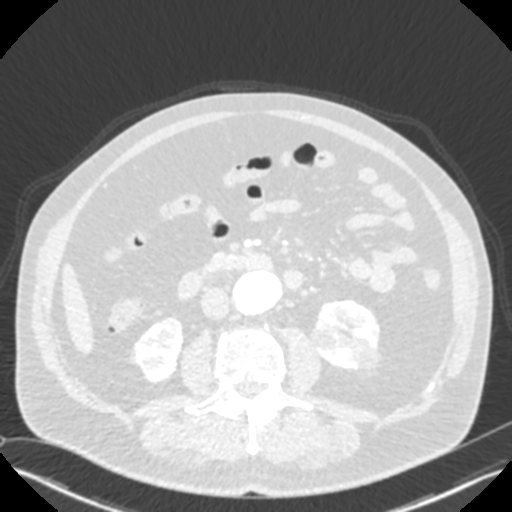
[im 192/363  mediastinal]
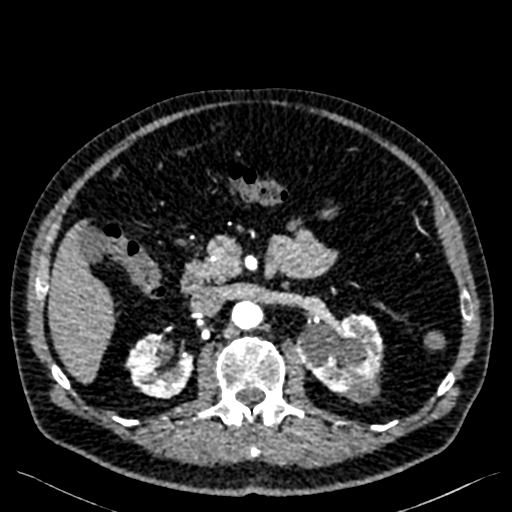
[im 213/363  lung]
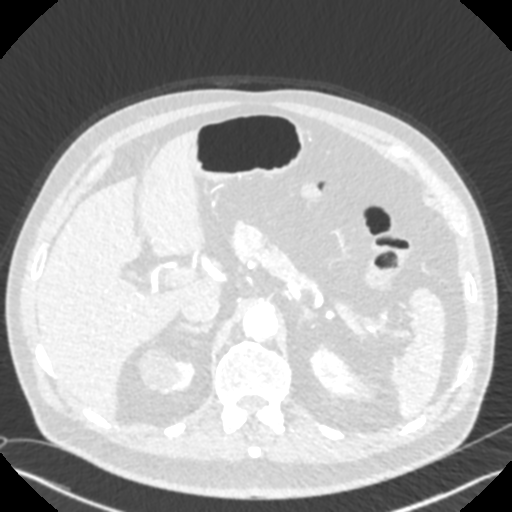
[im 235/363  mediastinal]
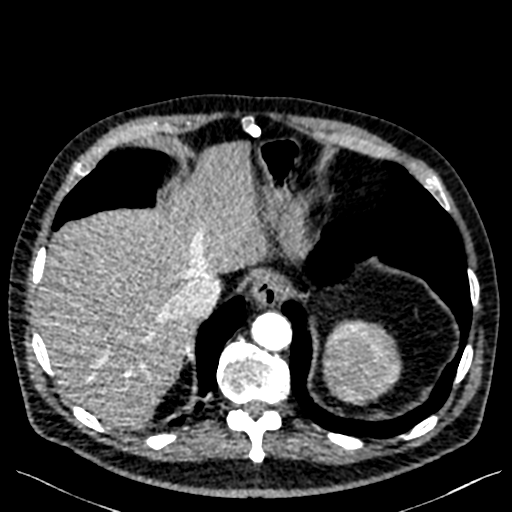
[im 277/363  lung]
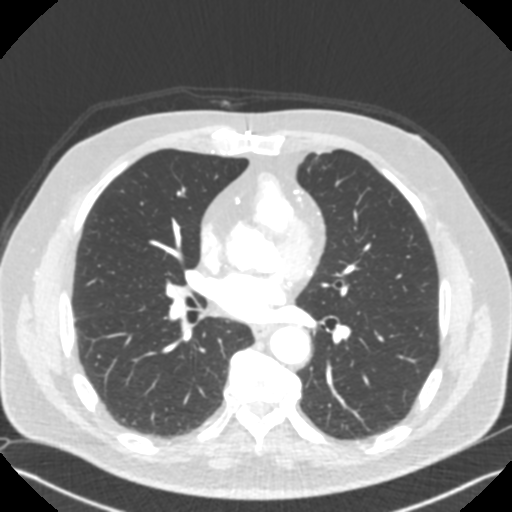
[im 299/363  mediastinal]
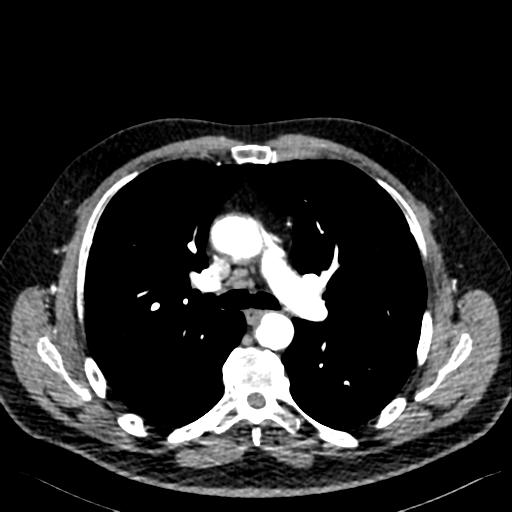
[im 320/363  lung]
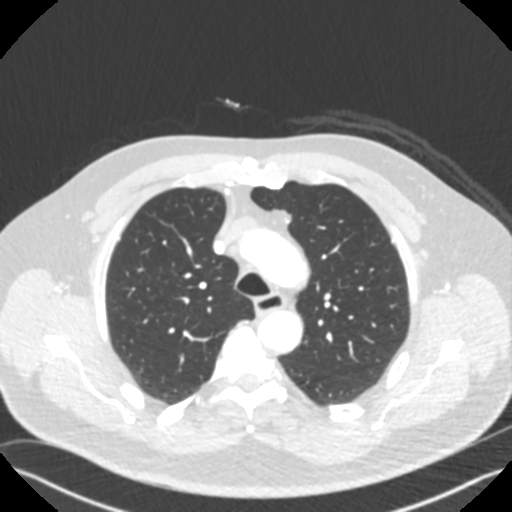
[im 341/363  mediastinal]
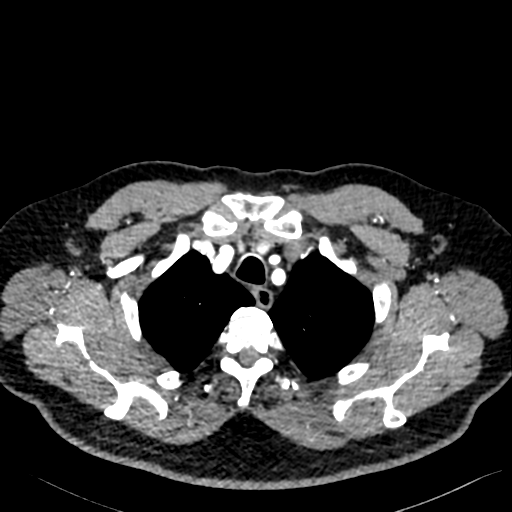

[Series 8: cta cap aneurysm 2.00 bv36 s3 cor cor st · coronal · 0.77mm/px · 1 of 174 slices shown]
[im 87/174  mediastinal]
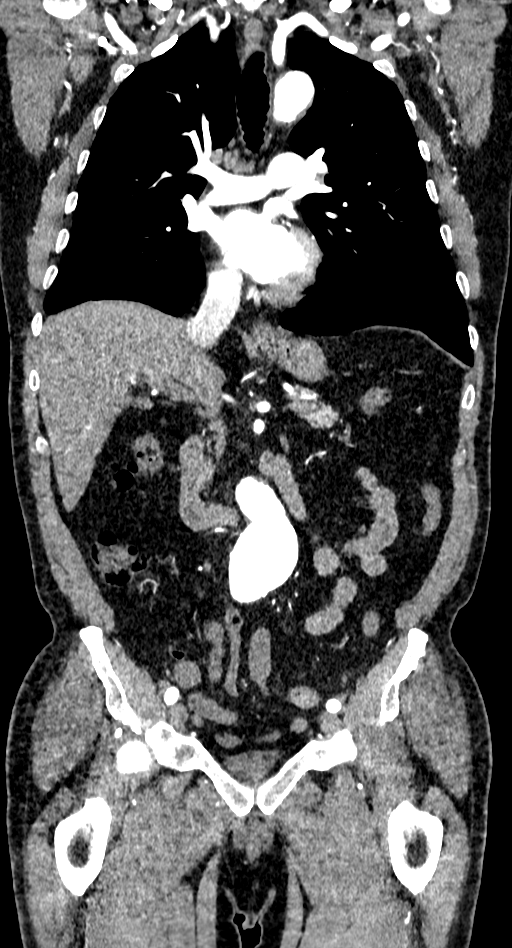

[15 of 36 positions shown; findings below may reference images not displayed]

FINDINGS: CTA CHEST FINDINGS

Cardiovascular: Graft repair of the ascending thoracic aorta is
stable in appearance. Maximal diameter of the aortic graft is
cm. Maximal diameter of the proximal aortic arch is 3.6 cm. There is
no evidence of dissection or acute intramural hematoma.
Atherosclerotic vascular calcifications in the thoracic aorta are
noted. Great vessels are patent. Postoperative changes from CABG are
noted. No obvious evidence of acute pulmonary thromboembolism.

Mediastinum/Nodes: No abnormal mediastinal adenopathy. No
pericardial effusion. Thyroid is within normal limits. Esophagus is
within normal limits.

Lungs/Pleura: Linear and patchy opacities at the right lung base are
most consistent with residual scarring from previously visualized
airspace disease related to pulmonary embolism. No pneumothorax or
pleural effusion.

Musculoskeletal: There is no vertebral compression deformity.

Review of the MIP images confirms the above findings.

CTA ABDOMEN AND PELVIS FINDINGS

VASCULAR

Aorta: A bilobed infrarenal abdominal aortic aneurysm is present.
Maximal AP and transverse diameters are 5.2 and 5.0 cm compared with
and maximal diameter of 3.4 cm on the prior study. There is no
luminal narrowing. There is a non flow limiting focal dissection
between the 2 lobes of the aortic aneurysm. See image 198 of series
5. There is also a small penetrating atherosclerotic ulcer to the
right within the neck of the aneurysm. See image 184 of series 5.

Celiac: Patent.  Branch vessels patent.

SMA: Patent.

Renals: Aneurysmal dilatation at the origin of the left renal artery
measures 11 mm in caliber. There is significant narrowing just
beyond the origin of the left renal artery. Right renal artery is
patent.

IMA: Patent.

Inflow: The right common iliac artery is tortuous. There is a
prominent penetrating atherosclerotic ulcer in the proximal right
common iliac artery associated with a maximal vessel caliber of
cm. This has progressed since the prior study. Aneurysmal dilatation
of the distal right common iliac artery is 2.1 cm. This is a stable
finding. Aneurysmal dilatation in the right internal iliac artery
measures 2.3 cm. This has significantly progressed. Right internal
and external iliac arteries are patent.

Left common iliac artery is markedly tortuous and aneurysmal with a
maximal diameter of 2.2 cm. This is not significantly changed. Left
internal and external iliac arteries are patent.

Veins: No obvious DVT.

Review of the MIP images confirms the above findings.

NON-VASCULAR

Hepatobiliary: Gallstones. Small cyst in the left lobe of the liver
is stable.

Pancreas: Unremarkable

Spleen: Unremarkable

Adrenals/Urinary Tract: There is a new 1.9 cm enhancing mass in the
lower pole of the right kidney. See image 32 of series 19. Other
hypodensities are consistent with simple cysts. Adrenal glands are
within normal limits. Bladder is within normal limits.

Stomach/Bowel: Minimal sigmoid diverticulosis. Normal appendix. No
obvious mass in the colon. No evidence of small-bowel obstruction.
Stomach is unremarkable other than small hiatal hernia.

Lymphatic: No abnormal retroperitoneal adenopathy.

Reproductive: Prostate is within normal limits.

Other: No free fluid.

Musculoskeletal: No vertebral compression deformity. Postoperative
changes from L4-5 decompression are present.

Review of the MIP images confirms the above findings.
IMPRESSION: Vascular:

Ascending aortic graft repair is stable in appearance without
evidence of complication or pseudoaneurysm.

Maximal diameter of the aortic arch is 3.6. Recommend annual imaging
followup by CTA or MRA. This recommendation follows 1575
ACCF/AHA/AATS/ACR/ASA/SCA/NADAR/MENELUS/BARTH/SORIN Guidelines for the
Diagnosis and Management of Patients with Thoracic Aortic Disease.
Circulation.1575; 121: E266-e369. Aortic aneurysm NOS ([id number])
cm.

Abdominal aortic aneurysm has increased from a maximal diameter of
3.4 cm on the prior study to 5.2 cm on today's study.

Significant narrowing in the left renal artery.

Right common iliac artery aneurysm has progressed now measuring
cm. This portion is associated with a prominent penetrating
atherosclerotic ulcer.

Right internal iliac artery aneurysm has also progressed and
measures 2.3 cm.

Stable left common iliac artery aneurysm at 2.2 cm in maximal
caliber.

Nonvascular:

**An incidental finding of potential clinical significance has been
found. ** New 1.9 cm enhancing mass at the lower pole of the right
kidney. Renal cell carcinoma is not excluded. MRI of the kidneys
with contrast is recommended.

Scarring at the right lung base.

Cholelithiasis.

## 2019-05-09 ENCOUNTER — Other Ambulatory Visit: Payer: Self-pay | Admitting: Physician Assistant

## 2019-07-29 ENCOUNTER — Other Ambulatory Visit: Payer: Self-pay | Admitting: Surgery

## 2019-07-29 DIAGNOSIS — I714 Abdominal aortic aneurysm, without rupture, unspecified: Secondary | ICD-10-CM

## 2019-07-29 DIAGNOSIS — I739 Peripheral vascular disease, unspecified: Secondary | ICD-10-CM

## 2019-07-29 DIAGNOSIS — I7121 Aneurysm of the ascending aorta, without rupture: Secondary | ICD-10-CM

## 2019-07-29 DIAGNOSIS — I719 Aortic aneurysm of unspecified site, without rupture: Secondary | ICD-10-CM

## 2019-07-30 ENCOUNTER — Other Ambulatory Visit: Payer: Self-pay | Admitting: Urology

## 2019-07-30 DIAGNOSIS — N281 Cyst of kidney, acquired: Secondary | ICD-10-CM

## 2019-07-30 DIAGNOSIS — C641 Malignant neoplasm of right kidney, except renal pelvis: Secondary | ICD-10-CM

## 2019-08-12 ENCOUNTER — Other Ambulatory Visit: Payer: Self-pay

## 2019-08-12 ENCOUNTER — Ambulatory Visit (HOSPITAL_COMMUNITY)
Admission: RE | Admit: 2019-08-12 | Discharge: 2019-08-12 | Disposition: A | Discharge status: Home / Self Care | Payer: Medicare Other | Source: Ambulatory Visit | Attending: Urology | Admitting: Urology

## 2019-08-12 DIAGNOSIS — N281 Cyst of kidney, acquired: Secondary | ICD-10-CM | POA: Diagnosis present

## 2019-08-12 DIAGNOSIS — C641 Malignant neoplasm of right kidney, except renal pelvis: Secondary | ICD-10-CM | POA: Insufficient documentation

## 2019-08-12 MED ORDER — GADOBUTROL 1 MMOL/ML IV SOLN
10.0000 mL | Freq: Once | INTRAVENOUS | Status: AC | PRN
  Administered 2019-08-12: 10 mL via INTRAVENOUS

## 2019-08-27 ENCOUNTER — Other Ambulatory Visit: Payer: Self-pay

## 2019-08-27 ENCOUNTER — Ambulatory Visit
Admission: RE | Admit: 2019-08-27 | Discharge: 2019-08-27 | Disposition: A | Discharge status: Home / Self Care | Payer: Medicare Other | Source: Ambulatory Visit | Attending: Surgery | Admitting: Surgery

## 2019-08-27 DIAGNOSIS — I714 Abdominal aortic aneurysm, without rupture, unspecified: Secondary | ICD-10-CM

## 2019-08-27 DIAGNOSIS — I739 Peripheral vascular disease, unspecified: Secondary | ICD-10-CM

## 2019-08-27 DIAGNOSIS — I7121 Aneurysm of the ascending aorta, without rupture: Secondary | ICD-10-CM

## 2019-08-27 DIAGNOSIS — I719 Aortic aneurysm of unspecified site, without rupture: Secondary | ICD-10-CM

## 2019-08-27 MED ORDER — IOPAMIDOL (ISOVUE-370) INJECTION 76%
75.0000 mL | Freq: Once | INTRAVENOUS | Status: AC | PRN
  Administered 2019-08-27: 75 mL via INTRAVENOUS

## 2019-09-02 ENCOUNTER — Encounter: Payer: Self-pay | Admitting: Surgery

## 2019-09-02 ENCOUNTER — Other Ambulatory Visit: Payer: Self-pay

## 2019-09-02 ENCOUNTER — Ambulatory Visit (INDEPENDENT_AMBULATORY_CARE_PROVIDER_SITE_OTHER): Payer: Medicare Other | Admitting: Surgery

## 2019-09-02 VITALS — BP 139/82

## 2019-09-02 DIAGNOSIS — I714 Abdominal aortic aneurysm, without rupture, unspecified: Secondary | ICD-10-CM

## 2019-09-02 NOTE — Progress Notes (Signed)
[facility] [place]  Patient name: Kenneth Scott MRN: [id number] DOB: [birth year] Sex: male      Virtual Visit via Telephone Note   This visit type was conducted due to national recommendations for restrictions regarding the COVID-19 Pandemic (e.g. social distancing) in an effort to limit this patient's exposure and mitigate transmission in our community.  Due to his co-morbid illnesses, this patient is at least at moderate risk for complications without adequate follow up.  This format is felt to be most appropriate for this patient at this time.  The patient did not have access to video technology/had technical difficulties with video requiring transitioning to audio format only (telephone).  All issues noted in this document were discussed and addressed.  No physical exam could be performed with this format.    Patient Location: Home Provider Location: Office    REASON FOR APPOINTMENT:    Follow up  HISTORY OF PRESENT ILLNESS:   Kenneth Scott is a 73 y.o. 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  He has had no complaints since his operation.  Patient has a history of myocardial infarction and PCI of his LAD in 2000.  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  He has had a valve replacement.  His history of DVT and PE in 2010.  He is on chronic anticoagulation.  He takes a statin for hypercholesterolemia.  He is a non-smoker.  He is medically managed for hypertension.  The patient does not have symptoms concerning for COVID-19 infection (fever, chills, cough, or new shortness of breath).   PAST MEDICAL HISTORY    Past Medical History:  Diagnosis Date  . AAA (abdominal aortic aneurysm) ([place]) 03/04/13   aortic duplex dopler- when compared tp previous study ther is no change. aneurysmal dilation measuring 3.5 x 3.4 cemtimeters  . Anxiety   . Coronary artery disease 04/23/13   NUC STRESS TEST-EF 52%  High risk scan two separate perfusion defects and a suggestion of reveribility in the inferior-inferolateral wall.  Marland Kitchen History of kidney stones   . Hyperlipidemia   . Hypertension 09/06/12   ECHO-EF 45%   . PE (pulmonary embolism)   . Sleep apnea    No cpap  . TIA (transient ischemic attack)      FAMILY HISTORY   Family History  Problem Relation Age of Onset  . Heart disease Mother   . Heart disease Father   . Cancer Sister     SOCIAL HISTORY:   Social History   Socioeconomic History  . Marital status: Divorced    Spouse name: Not on file  . Number of children: 2  . Years of education: Not on file  . Highest education level: Not on file  Occupational History  . Not on file  Social Needs  . Financial resource strain: Not on file  . Food insecurity    Worry: Not on file    Inability: Not on file  . Transportation needs    Medical: Not on file    Non-medical: Not on file  Tobacco Use  . Smoking status: Never Smoker  . Smokeless tobacco: Never Used  Substance and Sexual Activity  . Alcohol use: No  . Drug use: No  . Sexual activity: Not on file  Lifestyle  . Physical activity    Days per week: Not on file  Minutes per session: Not on file  . Stress: Not on file  Relationships  . Social Herbalist on phone: Not on file    Gets together: Not on file    Attends religious service: Not on file    Active member of club or organization: Not on file    Attends meetings of clubs or organizations: Not on file    Relationship status: Not on file  . Intimate partner violence    Fear of current or ex partner: Not on file    Emotionally abused: Not on file    Physically abused: Not on file    Forced sexual activity: Not on file  Other Topics Concern  . Not on file  Social History Narrative  . Not on file    ALLERGIES:    No Known Allergies  CURRENT MEDICATIONS:    Current Outpatient Medications  Medication Sig Dispense Refill  . allopurinol  (ZYLOPRIM) 300 MG tablet Take 300 mg by mouth daily.   4  . aspirin 81 MG tablet Take 81 mg by mouth daily.    . Coenzyme Q10 (CO Q-10) 200 MG CAPS Take 200 mg by mouth daily.    . colchicine 0.6 MG tablet Take 0.6 mg by mouth daily as needed (gout).     Marland Kitchen escitalopram (LEXAPRO) 10 MG tablet Take 5 mg by mouth daily.     . furosemide (LASIX) 40 MG tablet Take 20 mg by mouth daily.    . irbesartan-hydrochlorothiazide (AVALIDE) 150-12.5 MG per tablet Take 0.5 tablets by mouth daily.     . metoprolol succinate (TOPROL-XL) 25 MG 24 hr tablet TAKE 1/2 TABLET BY MOUTH EVERY DAY 45 tablet 1  . nitroGLYCERIN (NITROSTAT) 0.4 MG SL tablet PLACE 1 TABLET (0.4 MG TOTAL) UNDER THE TONGUE EVERY 5 (FIVE) MINUTES AS NEEDED FOR CHEST PAIN. 25 tablet 6  . oxyCODONE (OXY IR/ROXICODONE) 5 MG immediate release tablet Take 1 tablet (5 mg total) by mouth every 6 (six) hours as needed for moderate pain. 6 tablet 0  . pravastatin (PRAVACHOL) 20 MG tablet Take 20 mg by mouth daily.   4  . predniSONE (DELTASONE) 10 MG tablet Take 10 mg by mouth as needed (gout flare up).     . warfarin (COUMADIN) 5 MG tablet Take 5 mg by mouth daily.      No current facility-administered medications for this visit.     REVIEW OF SYSTEMS:   Please see the history of present illness.     All other systems reviewed and are negative.  PHYSICAL EXAM:   Vitals:   09/02/19 0826  BP: 139/82    GENERAL: The patient is a well-nourished male, in no acute distress.  PULMONARY: Nonlabored breathing NEUROLOGIC: No slurred speach PSYCHIATRIC: The patient has a normal affect.   Recent Labs: 01/01/2019: ALT 36 01/02/2019: Magnesium 1.8 01/03/2019: BUN 18; Creatinine, Ser 1.11; Hemoglobin 13.7; Platelets 146; Potassium 3.4; Sodium 141   Recent Lipid Panel Lab Results  Component Value Date/Time   CHOL 157 07/29/2016 08:45 AM   TRIG 217 (H) 07/29/2016 08:45 AM   HDL 34 (L) 07/29/2016 08:45 AM   CHOLHDL 4.6 07/29/2016 08:45 AM   LDLCALC  80 07/29/2016 08:45 AM    Wt Readings from Last 3 Encounters:  01/28/19 209 lb 7 oz (95 kg)  01/02/19 215 lb (97.5 kg)  01/01/19 212 lb 8.4 oz (96.4 kg)     STUDIES:   I have reviewed his CTA with  the following findings: VASCULAR  1. Status post EVAR. There has been slight interval growth of the aneurysm sac, currently measuring approximately 5.4 x 5.7 cm (previously measuring 5.4 x 5.4 cm). 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 Slight interval growth of a proximal right internal iliac artery aneurysm currently measuring approximately 2.7 cm (previously measuring 2.5 cm). 3. Moderate to high-grade stenosis involving the proximal left renal artery.  NON-VASCULAR  1. Stable 2.4 cm mass arising from the lower pole the right kidney consistent with renal cell carcinoma until proven otherwise. This lesion is likely amenable to percutaneous ablation as clinically indicated. Consider intervention radiology consultation. 2. There is cholelithiasis without secondary signs of acute cholecystitis. 3. Reflux of contrast in the IVC consistent with some degree of right-sided heart failure.  ASSESSMENT and PLAN   AAA: The patient continues to have a type II endoleak.  There is been a slight interval increase in the size of his aneurysm with maximum diameter now 5.7 cm.  Previously it was 5.4 cm.  There is always been a slight increase in the size of his right internal iliac artery aneurysm with maximum diameter 2.7 cm.  I discussed with the patient that we would repeat a CT scan in 6 months.  If the abdominal component has continued to increase in size, I will refer him to interventional radiology for consideration of embolization of his endoleak.  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  The importance of social distancing was discussed today.  Time:   Today, I have spent 16 minutes with the patient with telehealth technology discussing the above problems.       Leia Alf, MD, FACS [facility] [phone] Pager [phone]

## 2020-02-18 ENCOUNTER — Other Ambulatory Visit: Payer: Self-pay

## 2020-02-18 DIAGNOSIS — I714 Abdominal aortic aneurysm, without rupture, unspecified: Secondary | ICD-10-CM

## 2020-02-18 DIAGNOSIS — K862 Cyst of pancreas: Secondary | ICD-10-CM

## 2020-03-02 ENCOUNTER — Ambulatory Visit: Payer: Medicare Other | Admitting: Surgery

## 2020-03-19 ENCOUNTER — Other Ambulatory Visit: Payer: Medicare Other

## 2020-03-23 ENCOUNTER — Ambulatory Visit
Admission: RE | Admit: 2020-03-23 | Discharge: 2020-03-23 | Disposition: A | Discharge status: Home / Self Care | Payer: Medicare Other | Source: Ambulatory Visit | Attending: Surgery | Admitting: Surgery

## 2020-03-23 DIAGNOSIS — K862 Cyst of pancreas: Secondary | ICD-10-CM

## 2020-03-23 DIAGNOSIS — I714 Abdominal aortic aneurysm, without rupture, unspecified: Secondary | ICD-10-CM

## 2020-03-23 MED ORDER — IOPAMIDOL (ISOVUE-370) INJECTION 76%
75.0000 mL | Freq: Once | INTRAVENOUS | Status: AC | PRN
  Administered 2020-03-23: 75 mL via INTRAVENOUS

## 2020-03-23 MED ORDER — GADOBENATE DIMEGLUMINE 529 MG/ML IV SOLN
20.0000 mL | Freq: Once | INTRAVENOUS | Status: AC | PRN
  Administered 2020-03-23: 20 mL via INTRAVENOUS

## 2020-03-30 ENCOUNTER — Ambulatory Visit: Payer: Medicare Other | Admitting: Surgery

## 2020-04-13 ENCOUNTER — Other Ambulatory Visit: Payer: Self-pay | Admitting: Surgery

## 2020-04-13 ENCOUNTER — Ambulatory Visit (INDEPENDENT_AMBULATORY_CARE_PROVIDER_SITE_OTHER): Payer: Medicare Other | Admitting: Surgery

## 2020-04-13 ENCOUNTER — Other Ambulatory Visit: Payer: Self-pay

## 2020-04-13 ENCOUNTER — Encounter: Payer: Self-pay | Admitting: Surgery

## 2020-04-13 ENCOUNTER — Encounter: Payer: Self-pay | Admitting: Gastroenterology

## 2020-04-13 VITALS — BP 151/82 | HR 56 | Temp 97.8°F | Resp 20 | Ht 71.0 in | Wt 212.0 lb

## 2020-04-13 DIAGNOSIS — I714 Abdominal aortic aneurysm, without rupture, unspecified: Secondary | ICD-10-CM

## 2020-04-13 DIAGNOSIS — IMO0002 Reserved for concepts with insufficient information to code with codable children: Secondary | ICD-10-CM

## 2020-04-13 NOTE — Progress Notes (Signed)
[facility] [place]  Patient name: Kenneth Scott MRN: [id number] DOB: May 11, 1946 Sex: male   REASON FOR VISIT:    Follow up  HISOTRY OF PRESENT ILLNESS:   Kenneth Scott a 74 y.o.malewho is status post endovascular repair of a 5.2cm abdominal aortic aneurysm on 01/02/2019. He has had no complaints since his operation.  He denies any abdominal pain  Patient has a history of myocardial infarction and PCI of his LAD in 2000. 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 He has had a valve replacement. His history of DVT and PE in 2010. He is on chronic anticoagulation. He takes a statin for hypercholesterolemia. He is a non-smoker. He is medically managed for hypertension.   PAST MEDICAL HISTORY:   Past Medical History:  Diagnosis Date  . AAA (abdominal aortic aneurysm) ([place]) 03/04/13   aortic duplex dopler- when compared tp previous study ther is no change. aneurysmal dilation measuring 3.5 x 3.4 cemtimeters  . Anxiety   . Coronary artery disease 04/23/13   NUC STRESS TEST-EF 52% High risk scan two separate perfusion defects and a suggestion of reveribility in the inferior-inferolateral wall.  Marland Kitchen History of kidney stones   . Hyperlipidemia   . Hypertension 09/06/12   ECHO-EF 45%   . PE (pulmonary embolism)   . Sleep apnea    No cpap  . TIA (transient ischemic attack)      FAMILY HISTORY:   Family History  Problem Relation Age of Onset  . Heart disease Mother   . Heart disease Father   . Cancer Sister   . Heart disease Sister   . Heart disease Brother     SOCIAL HISTORY:   Social History   Tobacco Use  . Smoking status: Never Smoker  . Smokeless tobacco: Never Used  Substance Use Topics  . Alcohol use: No     ALLERGIES:   No Known Allergies   CURRENT MEDICATIONS:   Current Outpatient Medications  Medication Sig Dispense Refill  . allopurinol (ZYLOPRIM) 300 MG tablet Take 300 mg by mouth daily.    4  . aspirin 81 MG tablet Take 81 mg by mouth daily.    . Coenzyme Q10 (CO Q-10) 200 MG CAPS Take 200 mg by mouth daily.    . colchicine 0.6 MG tablet Take 0.6 mg by mouth daily as needed (gout).     Marland Kitchen escitalopram (LEXAPRO) 10 MG tablet Take 5 mg by mouth daily.     . furosemide (LASIX) 40 MG tablet Take 20 mg by mouth daily.    . irbesartan-hydrochlorothiazide (AVALIDE) 150-12.5 MG per tablet Take 0.5 tablets by mouth daily.     . metoprolol succinate (TOPROL-XL) 25 MG 24 hr tablet TAKE 1/2 TABLET BY MOUTH EVERY DAY 45 tablet 1  . nitroGLYCERIN (NITROSTAT) 0.4 MG SL tablet PLACE 1 TABLET (0.4 MG TOTAL) UNDER THE TONGUE EVERY 5 (FIVE) MINUTES AS NEEDED FOR CHEST PAIN. 25 tablet 6  . pravastatin (PRAVACHOL) 20 MG tablet Take 20 mg by mouth daily.   4  . predniSONE (DELTASONE) 10 MG tablet Take 10 mg by mouth as needed (gout flare up).     . warfarin (COUMADIN) 5 MG tablet Take 5 mg by mouth daily.      No current facility-administered medications for this visit.    REVIEW OF SYSTEMS:   [X]  denotes positive finding, [ ]  denotes negative finding Cardiac  Comments:  Chest pain or chest pressure:    Shortness of  breath upon exertion:    Short of breath when lying flat:    Irregular heart rhythm:        Vascular    Pain in calf, thigh, or hip brought on by ambulation:    Pain in feet at night that wakes you up from your sleep:     Blood clot in your veins:    Leg swelling:         Pulmonary    Oxygen at home:    Productive cough:     Wheezing:         Neurologic    Sudden weakness in arms or legs:     Sudden numbness in arms or legs:     Sudden onset of difficulty speaking or slurred speech:    Temporary loss of vision in one eye:     Problems with dizziness:         Gastrointestinal    Blood in stool:     Vomited blood:         Genitourinary    Burning when urinating:     Blood in urine:        Psychiatric    Major depression:         Hematologic    Bleeding  problems:    Problems with blood clotting too easily:        Skin    Rashes or ulcers:        Constitutional    Fever or chills:      PHYSICAL EXAM:   Vitals:   04/13/20 0813  BP: (!) 151/82  Pulse: (!) 56  Resp: 20  Temp: 97.8 F (36.6 C)  SpO2: 94%  Weight: 212 lb (96.2 kg)  Height: 5\' 11"  (1.803 m)    GENERAL: The patient is a well-nourished male, in no acute distress. The vital signs are documented above. CARDIAC: There is a regular rate and rhythm.  PULMONARY: Non-labored respirations ABDOMEN: Soft and non-tender MUSCULOSKELETAL: There are no major deformities or cyanosis. NEUROLOGIC: No focal weakness or paresthesias are detected. SKIN: There are no ulcers or rashes noted. PSYCHIATRIC: The patient has a normal affect.  STUDIES:   I have reviewed the following:  CTA 1. Interval increase in aortic aneurysm sac size now measuring approximately 5.8 x 6.1 cm compared to 5.5 x 5.7 cm at the same level on the prior study. There is persistent evidence of a probable type II endoleak with perfusion primarily within the posterior aspect of the lower aneurysm sac. This perfusion appears less focal than on the prior CTA and slightly more diffuse and remains suspicious for inflow from one or more lower lumbar arteries. No definite inflow from the IMA is identified. 2. Stable moderate stenosis of the proximal left renal artery due to noncalcified plaque/thrombus along the inferior aspect of the vessel causing approximately 50-70% luminal stenosis. 3. 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 4. Stable 2.3 cm rounded lesion emanating off of the inferior aspect of the lower pole of the right kidney previously demonstrating evidence of contrast enhancement by MRI.  MRA 1. 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 2. 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 3. Mild short-segment stenosis of the proximal left renal artery approximately 1.2 cm from its origin. 4. Bilateral renal lesions and cysts, better  characterized on previous dedicated abdomen MRI. 5. Limited evaluation of subcentimeter cystic pancreatic tail lesion identified on previous abdomen MRI. MEDICAL ISSUES:   AAA:  Maximum diameter has increased to 6.1 cm with known type II endoleak.  I will refer him to IR for possible embolization.  He will follow up with me in 6 months  Renal mass:  Currently being followed by [facility] at [facility]  Pancreatic mass:  Not well visualized on MR.  I will refer him to GI to make the next appropriate decisions    Annamarie Major, IV, MD, FACS [facility] [phone] Pager [phone]

## 2020-04-14 ENCOUNTER — Other Ambulatory Visit: Payer: Self-pay | Admitting: *Deleted

## 2020-04-14 DIAGNOSIS — I714 Abdominal aortic aneurysm, without rupture, unspecified: Secondary | ICD-10-CM

## 2020-04-22 ENCOUNTER — Telehealth: Payer: Self-pay | Admitting: Cardiovascular Disease

## 2020-04-22 ENCOUNTER — Encounter: Payer: Self-pay | Admitting: *Deleted

## 2020-04-22 ENCOUNTER — Ambulatory Visit
Admission: RE | Admit: 2020-04-22 | Discharge: 2020-04-22 | Disposition: A | Discharge status: Home / Self Care | Payer: Medicare Other | Source: Ambulatory Visit | Attending: Surgery | Admitting: Surgery

## 2020-04-22 DIAGNOSIS — IMO0002 Reserved for concepts with insufficient information to code with codable children: Secondary | ICD-10-CM

## 2020-04-22 HISTORY — PX: IR RADIOLOGIST EVAL & MGMT: IMG5224

## 2020-04-22 NOTE — Telephone Encounter (Signed)
Tried to call PCP was on hold got disconnected will call. Will call [facility] to fax to PCP's office  Called [facility] s/w brittany she states that they noticed that PCP does the coumadin too they have already faxed this clearance to they. Expresses thanks for calling.

## 2020-04-22 NOTE — Consult Note (Signed)
Chief Complaint: Endoleak with aneurysm sac growth, SP EVAR  Referring Physician(s): Brabham,Vance W  History of Present Illness: Kenneth Scott is a 74 y.o. male presenting as a scheduled consultation to [place] [facility] today, kindly referred by Dr. Trula Slade of [facility] [place], for evaluation of endoleak SP EVAR.   Kenneth Scott joins [place] in the clinic today for evaluation.    He has undergone EVAR for infrarenal AAA performed 01/02/2019.  He was repaired for primary protection, given enlarging aneurysm over time, with maximum size 5.2cm.   Device was a Dealer, main body 28 x 14 x 12 from the right, with extension 27 x 10.  Left limb was 27 x 14.  2 cuffs required proximally, each 28.5 x 3.3.    He tells me that he remembers he did just fine for his recovery.  He denies any abdominal pain.  He denies claudication.  He has not had any recent hospitalizations.  He has had MI in the past, and reports that he may have had a "mini-stroke".  He denies any chest pain or SOB.   He has been also treated with surgical repair of ascending aneurysm, with CABG.  He does not remember when this occurred.   He tells me that several years ago he had "back surgery" and had a post-op PE, treated with blood thinners.    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    He remains very active.  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  He is not married.  He has 2 grown children.    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  This includes CT dated 01/28/19, when the excluded aneurysm sac measured 5.6cm, and then CT dated 03/23/20, when the [place] measured 6.1cm.  No concerning secondary signs of inflammation or edema.   Past Medical History:  Diagnosis Date  . AAA (abdominal aortic aneurysm) ([place]) 03/04/13   aortic duplex dopler- when compared tp previous study ther is no change. aneurysmal dilation measuring 3.5 x 3.4 cemtimeters  . Anxiety   . Coronary artery disease 04/23/13   NU[facility] STRESS TEST-EF 52% High risk scan two separate perfusion defects and a suggestion of reveribility in the inferior-inferolateral wall.  Kenneth Scott History of kidney stones   . Hyperlipidemia   . Hypertension 09/06/12   ECHO-EF 45%   . PE (pulmonary embolism)   . Sleep apnea    No cpap  . TIA (transient ischemic attack)     Past Surgical History:  Procedure Laterality Date  . ABDOMINAL AORTIC ENDOVASCULAR STENT GRAFT  01/02/2019  . ABDOMINAL AORTIC ENDOVASCULAR STENT GRAFT N/A 01/02/2019   Procedure: ABDOMINAL AORTIC ENDOVASCULAR STENT GRAFT;  Surgeon: Serafina Mitchell, MD;  Location: [facility] OR;  Service: Vascular;  Laterality: N/A;  . AORTIC VALVE REPLACEMENT     Using a 25 mm homograft with reimplanation of his coronary arteries  . BACK SURGERY    . CORONARY ARTERY BYPASS GRAFT  2005   By Dr Cyndia Bent, He had a LIMA placed to the LAD, a vien to the diagonal, a vein to the marginal, a vein to the 2nd diagonal, a vein  to the PDA, PLA vessel.    Allergies: Patient has no known allergies.  Medications: Prior to Admission medications   Medication Sig Start Date End Date Taking?  Authorizing Provider  allopurinol (ZYLOPRIM) 300 MG tablet Take 300 mg by mouth daily.  07/25/16   [provider]  aspirin 81 MG tablet Take 81 mg by mouth daily.    [provider]  Coenzyme Q10 (CO Q-10) 200 MG CAPS Take 200 mg by mouth daily.    [provider]  colchicine 0.6 MG tablet Take 0.6 mg by mouth daily as needed (gout).     [provider]  escitalopram (LEXAPRO) 10 MG tablet Take 5 mg by mouth daily.     [provider]  furosemide (LASIX) 40 MG tablet Take 20 mg by mouth daily.    [provider]  irbesartan-hydrochlorothiazide (AVALIDE) 150-12.5 MG per tablet Take 0.5 tablets by  mouth daily.     [provider]  metoprolol succinate (TOPROL-XL) 25 MG 24 hr tablet TAKE 1/2 TABLET BY MOUTH EVERY DAY 05/09/19   Almyra Deforest, PA  nitroGLYCERIN (NITROSTAT) 0.4 MG SL tablet PLACE 1 TABLET (0.4 MG TOTAL) UNDER THE TONGUE EVERY 5 (FIVE) MINUTES AS NEEDED FOR CHEST PAIN. 10/17/17   Troy Sine, MD  pravastatin (PRAVACHOL) 20 MG tablet Take 20 mg by mouth daily.  07/25/16   [provider]  predniSONE (DELTASONE) 10 MG tablet Take 10 mg by mouth as needed (gout flare up).  03/31/13   [provider]  warfarin (COUMADIN) 5 MG tablet Take 5 mg by mouth daily.     [provider]     Family History  Problem Relation Age of Onset  . Heart disease Mother   . Heart disease Father   . Cancer Sister   . Heart disease Sister   . Heart disease Brother     Social History   Socioeconomic History  . Marital status: Divorced    Spouse name: Not on file  . Number of children: 2  . Years of education: Not on file  . Highest education level: Not on file  Occupational History  . Not on file  Tobacco Use  . Smoking status: Never Smoker  . Smokeless tobacco: Never Used  Substance and Sexual Activity  . Alcohol use: No  . Drug use: No  . Sexual activity: Not on file  Other Topics Concern  . Not on file  Social History Narrative  . Not on file   Social Determinants of Health   Financial Resource Strain:   . Difficulty of Paying Living Expenses:   Food Insecurity:   . Worried About Charity fundraiser in the Last Year:   . Arboriculturist in the Last Year:   Transportation Needs:   . Film/video editor (Medical):   Kenneth Scott Lack of Transportation (Non-Medical):   Physical Activity:   . Days of Exercise per Week:   . Minutes of Exercise per Session:   Stress:   . Feeling of Stress :   Social Connections:   . Frequency of Communication with Friends and Family:   . Frequency of Social Gatherings with Friends and Family:   . Attends  Religious Services:   . Active Member of Clubs or Organizations:   . Attends Archivist Meetings:   Kenneth Scott Marital Status:     ECOG Status: 0 - Asymptomatic  Review of Systems: A 12 point ROS discussed and pertinent positives are indicated in the HPI above.  All other systems are negative.  Review of Systems  Vital Signs: There were no vitals taken for this visit.  Physical Exam  General: 74 yo male appearing stated age.  Well-developed, well-nourished.  No distress. HEENT: Atraumatic, normocephalic.  Glasses.  Conjugate gaze, extra-ocular motor intact. No scleral icterus or scleral injection. No lesions on external ears, nose, lips, or gums.  Oral mucosa moist, pink.  Neck: Symmetric with no goiter enlargement.  Chest/Lungs:  Symmetric chest with inspiration/expiration.  No labored breathing.  Clear to auscultation with no wheezes, rhonchi, or rales.  Heart:  RRR, with no third heart sounds appreciated. No JVD appreciated.  Abdomen:  Soft, NT/ND, with + bowel sounds.  No palpable pulsatile mass.   Genito-urinary: Deferred Neurologic: Alert & Oriented to person, place, and time.   Normal affect and insight.  Appropriate questions.  Moving all 4 extremities with gross sensory intact.  Pulse Exam:  Palpable right CFA & left CFA.   Doppler positive right AT & PT.  Doppler positive left AT & PT. No carotid bruit appreciated.  Extremities: No wound. Warm. Hairless.       Imaging: No results found.  Labs:  CBC: No results for input(s): WBC, HGB, HCT, PLT in the last 8760 hours.  COAGS: No results for input(s): INR, APTT in the last 8760 hours.  BMP: No results for input(s): NA, K, CL, CO2, GLUCOSE, BUN, CALCIUM, CREATININE, GFRNONAA, GFRAA in the last 8760 hours.  Invalid input(s): CMP  LIVER FUNCTION TESTS: No results for input(s): BILITOT, AST, ALT, ALKPHOS, PROT, ALBUMIN in the last 8760 hours.  TUMOR MARKERS: No results for input(s): AFPTM, CEA, CA199, CHROMGRNA in  the last 8760 hours.  Assessment and Plan:  Kenneth. Arrowsmith is a 74 yo male with history of treated AAA with EVAR, 01/02/2019, now with enlarging aneurysm sac secondary to Type II endoleak.    I had a lengthy discussion with Kenneth Grieger regarding EVAR and type II endoleak.  This included: the surgical trade-off of open vs EVAR repair, with decreased up-front mortality/morbidity of EVAR yielding to a known increased rate of secondary intervention, the natural history and cited risk of rupture of a type II endoleak (~2% by [employer] data), active surveillance vs treatment, potential treatment options that would include trans-arterial, trans-venous, or direct sac access, and other risks of angiogram/embolization.  Additional risks include: bleeding, infection, arterial dissection, need for further surgery/procedure, hospitalization, contrast reaction, kidney injury, non-target embolization, cardiopulmonary collapse, death.    Regarding active surveillance, I did discuss with him my concern that there has been at least 50mm of growth over short time in the setting of the ongoing endoleak.  My recommendation would be for trans-arterial treatment.    After our discussion, he would like to proceed.  He did tell me that it might take him a few weeks to arrange a good day, given his business.   I answered all of his questions.    Plan: - Aorto-peripheral angiogram and possible embolization of type II endoleak, with Dr. Earleen Newport at [facility].  Moderate sedation - We will need to temporarily withdraw from any anti-coagulation for our treatment.  - I have advised him to continue medical care.   Thank you for this interesting consult.  I greatly enjoyed meeting ERLIN LAWHORN and look forward to participating in their care.  A copy of this report was sent to the requesting provider on this date.  Electronically Signed: Corrie Mckusick 04/22/2020, 10:05 AM   I spent a total of  40 Minutes   in face to face in clinical  consultation, greater than 50% of which was counseling/coordinating care for  endoleak after EVAR, with aneurysm sac growth, possible angiogram and possible embolization.

## 2020-04-22 NOTE — Telephone Encounter (Signed)
Primary Cardiologist:Thomas Claiborne Billings, MD  Chart reviewed as part of pre-operative protocol coverage. Because of Kenneth Scott's past medical history and time since last visit, he/she will require a follow-up visit in order to better assess preoperative cardiovascular risk.  Pre-op covering staff: - Please schedule appointment and call patient to inform them. - Please contact requesting surgeon's office via preferred method (i.e, phone, fax) to inform them of need for appointment prior to surgery.  If applicable, this message will also be routed to pharmacy pool and/or primary cardiologist for input on holding anticoagulant/antiplatelet agent as requested below so that this information is available at time of patient's appointment.   Deberah Pelton, NP  04/22/2020, 11:22 AM

## 2020-04-22 NOTE — Telephone Encounter (Signed)
Patient's Coumadin is managed by his PCP.  His PCPs office will need to provide recommendation for holding Coumadin preoperatively.

## 2020-04-22 NOTE — Telephone Encounter (Signed)
   [facility] Pre-operative Risk Assessment    HEARTCARE STAFF: - Please ensure there is not already an duplicate clearance open for this procedure. - Under Visit Info/Reason for Call, type in Other and utilize the format Clearance MM/DD/YY or Clearance TBD. Do not use dashes or single digits. - If request is for dental extraction, please clarify the # of teeth to be extracted.  Request for surgical clearance:  1. What type of surgery is being performed? Aorta Peripheral Angiogram and possible embolization of type 2 Endo leak 2.  3. When is this surgery scheduled? Pending   4. What type of clearance is required (medical clearance vs. Pharmacy clearance to hold med vs. Both)? Medicine*  5. Are there any medications that need to be held prior to surgery and how long?Can pt hold his Coumadin 4 days prior to procedure?   6. Practice name and name of physician performing surgery? Dr Damita Dunnings   7. What is the office phone number? [phone]   7.   What is the office fax number? [phone]  8.   Anesthesia type (None, local, MAC, general) ?moderate sedation   Kenneth Scott 04/22/2020, 11:01 AM  _________________________________________________________________   (provider comments below)

## 2020-04-24 NOTE — Telephone Encounter (Signed)
Left message for the pt to please call the office to schedule a pre op clearance appt with Dr. Claiborne Billings or APP. I will also send a message to the [facility] scheduler to assist in scheduling an appt for the pt.

## 2020-04-28 ENCOUNTER — Telehealth: Payer: Self-pay

## 2020-04-28 NOTE — Telephone Encounter (Signed)
Pt is scheduled to see Roby Lofts, PA-C,  05/18/20. Will route back to requesting surgeon's office to make them aware.

## 2020-04-28 NOTE — Telephone Encounter (Signed)
Called and left a voice message for the patient on the number listed for his home number about getting him rescheduled on a different day of the same week with another APP and to give me a call back at the office.

## 2020-05-06 NOTE — Telephone Encounter (Signed)
Pt's is now scheduled to see Coletta Memos, FNP 05/22/20 for pre op clearance. I will fax notes to surgeon only as FYI that appt hs ben changed to 05/22/20. I will forward clearance notes to NP for appt.

## 2020-05-18 ENCOUNTER — Ambulatory Visit: Payer: Medicare Other | Admitting: Medical

## 2020-05-19 ENCOUNTER — Ambulatory Visit (INDEPENDENT_AMBULATORY_CARE_PROVIDER_SITE_OTHER): Payer: Medicare Other | Admitting: Gastroenterology

## 2020-05-19 ENCOUNTER — Encounter: Payer: Self-pay | Admitting: Gastroenterology

## 2020-05-19 VITALS — BP 130/70 | HR 54 | Ht 71.0 in | Wt 212.0 lb

## 2020-05-19 DIAGNOSIS — K869 Disease of pancreas, unspecified: Secondary | ICD-10-CM | POA: Diagnosis not present

## 2020-05-19 NOTE — Patient Instructions (Signed)
If you are age 76 or older, your body mass index should be between 23-30. Your Body mass index is 29.57 kg/m. If this is out of the aforementioned range listed, please consider follow up with your Primary Care Provider.  If you are age 52 or younger, your body mass index should be between 19-25. Your Body mass index is 29.57 kg/m. If this is out of the aformentioned range listed, please consider follow up with your Primary Care Provider.   Follow up as needed.   It was a pleasure to see you today!  Dr. Loletha Carrow

## 2020-05-19 NOTE — Progress Notes (Signed)
[facility] Consult Note:  History: JENNIE BOLAR 05/19/2020  Referring provider: Curly Rim, MD  Reason for consult/chief complaint: Pancreatic mass (no abd pain, no fever, no nausea or vomiting)   Subjective  HPI: Patient is seen here in 2015 for colon cancer screening. Given his multiple complex medical conditions, it was felt that a Cologuard would be the best approach. There are no results to indicate that it was completed.  _______________________  From PCP routine health visit March of this year (Dr. Neta Mends): "1. Hyperlipidemia, unspecified hyperlipidemia type (Primary) - Lipid panel; Future 2. Gout, unspecified cause, unspecified chronicity, unspecified site 3. History of pulmonary embolism 4. Essential hypertension with goal blood pressure less than 130/80 - CBC And Differential; Future 5. Coronary artery disease, angina presence unspecified, unspecified vessel or lesion type, unspecified whether native or transplanted heart - CBC And Differential; Future 6. Long term (current) use of anticoagulants [Z79.01] 7. Stage 3a chronic kidney disease - Comprehensive metabolic panel; Future 8. Encounter for subsequent annual wellness visit (AWV) in Medicare patient 9. Pre-ulcerative corn or callous - Ambulatory referral to Podiatry 10. Major depression in complete remission (*) 11. AAA (abdominal aortic aneurysm) without rupture (*)  Labs that day:  Comprehensive metabolic panel (17/40/8144 9:29 AM EST) Comprehensive metabolic panel (81/85/6314 9:29 AM EST)  Component Value Ref Range Performed At Pathologist Signature  Glucose 92 65 - 99 mg/dL [facility]   BUN 22 8 - 27 mg/dL [facility]   Creatinine 1.28 (H) 0.76 - 1.27 mg/dL [facility]   eGFR If NonAfrican American 54 (L) >59 mL/min/1.73 [facility]   eGFR If African American 63 >59 mL/min/1.73 [facility]   BUN/Creatinine Ratio 17 10 - 24 [facility]   Sodium 144 134 - 144 mmol/L  [facility]   Potassium 4.2 3.5 - 5.2 mmol/L [facility]   Chloride 102 96 - 106 mmol/L [facility]   CO2 28 20 - 29 mmol/L [facility]   CALCIUM 9.9 8.6 - 10.2 mg/dL [facility]   Total Protein 7.1 6.0 - 8.5 g/dL [facility]   Albumin, Serum 4.6 3.7 - 4.7 g/dL [facility]   Globulin, Total 2.5 1.5 - 4.5 g/dL [facility]   Albumin/Globulin Ratio 1.8 1.2 - 2.2 [facility]   Total Bilirubin 0.5 0.0 - 1.2 mg/dL [facility]   Alkaline Phosphatase 109 39 - 117 IU/L [facility]   AST 23 0 - 40 IU/L [facility]   ALT (SGPT) 20 0 - 44 IU/L [facility]    CBC And Differential (02/05/2020 9:29 AM EST) CBC And Differential (02/05/2020 9:29 AM EST)  Component Value Ref Range Performed At Pathologist Signature  WBC 8.7 3.4 - 10.8 x10E3/uL [facility]   RBC 4.53 4.14 - 5.80 x10E6/uL [facility]   Hemoglobin 15.6 13.0 - 17.7 g/dL [facility]   Hematocrit 47.1 37.5 - 51.0 % [facility]   MCV 104 (H) 79 - 97 fL [facility]   MCH 34.4 (H) 26.6 - 33.0 pg [facility]   MCHC 33.1 31.5 - 35.7 g/dL [facility]   RDW 12.8 11.6 - 15.4 % [facility]   Platelet Count 188 150 - 450 x10E3/uL [facility]   Neutrophils 69 Not Estab. % [facility]   Lymphs Relative  16 Not Estab. % [facility]   Monocytes 10 Not Estab. % [facility]   Eos Relative  4 Not Estab. % [facility]   Basos Relative  1 Not  Estab. % [facility]   Neutrophils Absolute 6.0 1.4 - 7.0 x10E3/uL [facility]   Lymphocytes Absolute 1.4 0.7 - 3.1 x10E3/uL [facility]   Monocytes Absolute 0.9 0.1 - 0.9 x10E3/uL [facility]   Eosinophils Absolute 0.4 0.0 - 0.4 x10E3/uL [facility]   Basophils Absolute 0.1 0.0 - 0.2 x10E3/uL [facility]   Immature Granulocytes 0 Not Estab. % [facility]   Immature Grans (Abs) 0.0 0.0 - 0.1 x10E3/uL [facility]     "  _______________________________________   This is a delightful man accompanied by his wife, referred by primary care to evaluate a pancreatic lesion seen incidentally on MRI abdomen.  He denies abdominal pain, loss of appetite,  nausea, vomiting weight loss or altered bowel habits.  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   ROS:  Review of Systems  Constitutional: Negative for appetite change and unexpected weight change.  HENT: Negative for mouth sores and voice change.   Eyes: Negative for pain and redness.  Respiratory: Negative for cough and shortness of breath.   Cardiovascular: Negative for chest pain and palpitations.  Genitourinary: Negative for dysuria and hematuria.  Musculoskeletal: Negative for arthralgias and myalgias.  Skin: Negative for pallor and rash.  Neurological: Negative for weakness and headaches.       Bilateral foot pain  Hematological: Negative for adenopathy.     Past Medical History: Past Medical History:  Diagnosis Date  . AAA (abdominal aortic aneurysm) ([place]) 03/04/13   aortic duplex dopler- when compared tp previous study ther is no change. aneurysmal dilation measuring 3.5 x 3.4 cemtimeters  . Anxiety   . Chronic anticoagulation   . Coronary artery disease 04/23/13   NU[facility] STRESS TEST-EF 52% High risk scan two separate perfusion defects and a suggestion of reveribility in the inferior-inferolateral wall.  . Gout   . History of DVT (deep vein thrombosis)   . History of kidney stones   . Hyperlipidemia   . Hypertension 09/06/12   ECHO-EF 45%   . Metatarsalgia of both feet   . Pancreatic cyst   . Pancreatic lesion   . PE (pulmonary embolism)   . PVD (peripheral vascular disease) ([place])   . Sleep apnea    No cpap  . TIA (transient ischemic attack)      Past Surgical History: Past Surgical History:  Procedure Laterality Date  . ABDOMINAL AORTIC ENDOVASCULAR STENT GRAFT  01/02/2019  . ABDOMINAL AORTIC ENDOVASCULAR STENT GRAFT N/A 01/02/2019   Procedure: ABDOMINAL AORTIC ENDOVASCULAR STENT GRAFT;  Surgeon: Serafina Mitchell, MD;  Location: [facility] OR;  Service: Vascular;  Laterality: N/A;  . AORTIC VALVE  REPLACEMENT     Using a 25 mm homograft with reimplanation of his coronary arteries  . BACK SURGERY    . CORONARY ARTERY BYPASS GRAFT  2005   By Dr Cyndia Bent, He had a LIMA placed to the LAD, a vien to the diagonal, a vein to the marginal, a vein to the 2nd diagonal, a vein  to the PDA, PLA vessel.  . IR RADIOLOGIST EVAL & MGMT  04/22/2020     Family History: Family History  Problem Relation Age of Onset  . Heart disease Mother   . Heart disease Father   . Cancer Sister   . Heart disease Sister   . Heart disease Brother     Social History: Social History   Socioeconomic History  .  Marital status: Divorced    Spouse name: Not on file  . Number of children: 2  . Years of education: Not on file  . Highest education level: Not on file  Occupational History  . Occupation: self employed logger  Tobacco Use  . Smoking status: Never Smoker  . Smokeless tobacco: Never Used  Vaping Use  . Vaping Use: Never used  Substance and Sexual Activity  . Alcohol use: No  . Drug use: No  . Sexual activity: Not on file  Other Topics Concern  . Not on file  Social History Narrative  . Not on file   Social Determinants of Health   Financial Resource Strain:   . Difficulty of Paying Living Expenses:   Food Insecurity:   . Worried About Charity fundraiser in the Last Year:   . Arboriculturist in the Last Year:   Transportation Needs:   . Film/video editor (Medical):   Marland Kitchen Lack of Transportation (Non-Medical):   Physical Activity:   . Days of Exercise per Week:   . Minutes of Exercise per Session:   Stress:   . Feeling of Stress :   Social Connections:   . Frequency of Communication with Friends and Family:   . Frequency of Social Gatherings with Friends and Family:   . Attends Religious Services:   . Active Member of Clubs or Organizations:   . Attends Archivist Meetings:   Marland Kitchen Marital Status:     Allergies: No Known Allergies  Outpatient Meds: Current  Outpatient Medications  Medication Sig Dispense Refill  . allopurinol (ZYLOPRIM) 300 MG tablet Take 300 mg by mouth daily.   4  . aspirin 81 MG tablet Take 81 mg by mouth daily.    . Coenzyme Q10 (CO Q-10) 200 MG CAPS Take 200 mg by mouth daily.    . colchicine 0.6 MG tablet Take 0.6 mg by mouth daily as needed (gout).     Marland Kitchen escitalopram (LEXAPRO) 10 MG tablet Take 5 mg by mouth daily.     . furosemide (LASIX) 40 MG tablet Take 20 mg by mouth daily.    . irbesartan-hydrochlorothiazide (AVALIDE) 150-12.5 MG per tablet Take 0.5 tablets by mouth daily.     . metoprolol succinate (TOPROL-XL) 25 MG 24 hr tablet TAKE 1/2 TABLET BY MOUTH EVERY DAY 45 tablet 1  . nitroGLYCERIN (NITROSTAT) 0.4 MG SL tablet PLACE 1 TABLET (0.4 MG TOTAL) UNDER THE TONGUE EVERY 5 (FIVE) MINUTES AS NEEDED FOR CHEST PAIN. 25 tablet 6  . pravastatin (PRAVACHOL) 20 MG tablet Take 20 mg by mouth daily.   4  . predniSONE (DELTASONE) 10 MG tablet Take 10 mg by mouth as needed (gout flare up).     . warfarin (COUMADIN) 5 MG tablet Take 5 mg by mouth daily.      No current facility-administered medications for this visit.      ___________________________________________________________________ Objective    Exam:  BP 130/70   Pulse (!) 54   Ht _0  (1.803 m)   Wt 212 lb (96.2 kg)   BMI 29.57 kg/m  His wife was present for the entire visit.  General: Ambulatory, conversational and pleasant, well-appearing, gets on exam table without assistance.  Eyes: sclera anicteric, no redness  ENT: oral mucosa moist without lesions, no cervical or supraclavicular lymphadenopathy  CV: RRR without murmur, S1/S2, no JVD, no peripheral edema  Resp: clear to auscultation bilaterally, normal RR and effort noted  GI: soft,  no tenderness, with active bowel sounds. No guarding or palpable organomegaly noted.  Skin; warm and dry, no rash or jaundice noted  Neuro: awake, alert and oriented x 3. Normal gross motor function and  fluent speech  Labs:  See above  Radiologic Studies: CLINICAL DATA:  74 year old male with history of renal cell carcinoma. Follow-up study.   EXAM: MRI ABDOMEN WITHOUT AND WITH CONTRAST   TECHNIQUE: Multiplanar multisequence MR imaging of the abdomen was performed both before and after the administration of intravenous contrast.   CONTRAST:  42m GADAVIST GADOBUTROL 1 MMOL/ML IV SOLN   COMPARISON:  Abdominal MRI 12/29/2018. CT of the abdomen and pelvis 01/28/2019.   FINDINGS: Lower chest: Unremarkable.   Hepatobiliary: 1.2 x 0.9 cm T1 hypointense, T2 hyperintense, nonenhancing lesion in segment 3 of the liver, compatible with a small simple cyst. No suspicious cystic or solid hepatic lesions. No intra or extrahepatic biliary ductal dilatation. Gallbladder is normal in appearance.   Pancreas: In the tail of the pancreas (axial image 10 of series 4) there is a 5 mm T1 hypointense, T2 hyperintense, nonenhancing lesion which likely represents a tiny pancreatic pseudocyst. No other pancreatic mass. No pancreatic ductal dilatation. No peripancreatic fluid collections or inflammatory changes.   Spleen:  Unremarkable.   Adrenals/Urinary Tract: The lesion of concern in the lower pole of the right kidney appears similar to the prior study, currently measuring 2.3 x 1.8 x 1.7 cm (axial image 65 of series 10/3 and coronal image 34 of series 11). This lesion is isointense on T1 weighted images, slightly T2 hyperintense but heterogeneous, and demonstrates relatively diffuse enhancement on post gadolinium images. This lesion is encapsulated within Gerota's fascia and is well separated from the right renal vein which is widely patent. The other lesion of concern in the upper pole of the left kidney is also similar in size measuring 4.7 cm on today's examination (axial image 32 of series 1000) and is heterogeneously T1 hyperintense and heterogeneously T2 hypointense, without  evidence of internal enhancement, most compatible with a benign proteinaceous/hemorrhagic cyst. Several other T1 hypointense, T2 hyperintense, nonenhancing lesions are noted in both kidneys, compatible with simple cysts, largest of which measure up to 3.6 cm in the interpolar region of the left kidney. There is also a subcentimeter T1 hyperintense, T2 hypointense nonenhancing lesion in the lower pole of the left kidney (axial image 62 of series 1000), which is compatible with a small proteinaceous/hemorrhagic cyst. No hydroureteronephrosis in the visualized portions of the abdomen. Bilateral adrenal glands are normal in appearance.   Stomach/Bowel: Visualized portions are unremarkable.   Vascular/Lymphatic: Infrarenal abdominal aortic aneurysm measuring up to 6.0 x 5.6 cm with what appears to be a an aorto bi-iliac bypass graft which is patent. Notably, along the posterior aspect of the aneurysm sac (best appreciated on axial image 76 of series 1002) there is clear evidence of internal enhancement within the aneurysm sac, compatible with endoleak.   Other: No significant volume of ascites noted in the visualized portions of the peritoneal cavity.   Musculoskeletal: No aggressive appearing osseous lesions are noted in the visualized portions of the skeleton.   IMPRESSION: 1. Previously noted enhancing lesion in the lower pole of the right kidney is similar in size measuring 2.3 x 1.8 x 1.7 cm on today's examination, demonstrating imaging characteristics highly concerning for a small renal cell carcinoma. This remains encapsulated within Gerota's fascia, does not involve the right renal vein and is not associated with signs of lymphadenopathy or  metastatic disease in the abdomen. 2. 4.7 cm lesion in the upper pole the left kidney has imaging characteristics compatible with a proteinaceous/hemorrhagic cyst (Bosniak class 2). 3. Multiple other Bosniak class 1 and Bosniak class 2  cysts in the kidneys bilaterally, as above. 4. 5 mm simple appearing cystic lesion in the tail of the pancreas, stable compared to the prior examination. This is favored to represent a benign lesion such as a tiny pancreatic pseudocyst. Attention at time of routine follow-up for the renal lesion is recommended to ensure continued stability. 5. Aortic atherosclerosis with infrarenal abdominal aortic aneurysm measuring 6.0 x 5.6 cm (previously only 5.2 cm) status post aorto bi-iliac stent graft placement, with clear evidence of endoleak on today's examination.    08/12/19  ______________ CLINICAL DATA:  Increasing size of aortic aneurysm post endograft repair. Renal mass. Pancreatic cystic lesion.   EXAM: MRA ABDOMEN WITH CONTRAST   TECHNIQUE: Multiplanar, multiecho pulse sequences of the abdomen were obtained with intravenous contrast. Angiographic images of abdomen were obtained using MRA technique with intravenous contrast.   CONTRAST:  33m MULTIHANCE GADOBENATE DIMEGLUMINE 529 MG/ML IV SOLN   COMPARISON:  CTA from the same day, MR abdomen 08/12/2019   FINDINGS: ARTERIAL   Aorta: Mild atheromatous change in the visualized distal descending thoracic and suprarenal segments. Patent infrarenal bifurcated stent graft. 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 Native aneurysm sac measures up to 6.4 cm diameter.   Celiac axis:          Patent, distal branching unremarkable.   Superior mesenteric:  Patent, classic distal branch anatomy.   Left renal: Single , with short-segment stenosis of at least mild severity approximately 1.2 cm from its origin, patent distally.   Right renal:          Single, widely patent.   Inferior mesenteric: Patent distally. No convincing contribution to the endoleak is identified.   Left iliac: Distal limb patent. Tortuosity of the visualized distal native iliac system.   Right iliac: 2 cm dilatation of distal native  common iliac. 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   VENOUS   Visualized iliac venous system and IVC widely patent. Patent bilateral renal veins, portal vein. No venous pathology evident.   NONVASCULAR   Lower chest:  No pleural or pericardial effusion.   Hepatobiliary: 1.1 cm left lobe cyst. Gallbladder unremarkable. No biliary dilatation.   Pancreas: Subcentimeter cystic pancreatic tail lesion is less conspicuous than on the prior dedicated abdominal MR evaluation. No new mass or ductal dilatation.   Spleen: Within normal limits in size and appearance.   Adrenals/Urinary Tract: Adrenal glands unremarkable. Complex 5.1 cm left upper pole lesion, previously 4.7 cm. 2.2 cm right lower pole mass previously 2.3 cm. Additional cystic and complex cystic lesions bilaterally as before. No hydronephrosis.   Stomach/Bowel: No evidence of obstruction, inflammatory process, or abnormal fluid collections.   Lymphatic: No pathologically enlarged lymph nodes.   Other: No ascites.   Musculoskeletal: No suspicious bone lesions identified.   IMPRESSION: 1. 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 2. 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 3. Mild short-segment stenosis of the proximal left renal artery approximately 1.2 cm from its origin. 4. Bilateral renal lesions and cysts, better characterized on previous dedicated abdomen MRI. 5. Limited evaluation of subcentimeter cystic pancreatic tail lesion identified on previous  abdomen MRI.     Electronically Signed   By: Lucrezia Europe M.D.   On: 03/23/2020 10:53   __________________________________  From IR physician note 04/22/20: "Mr. 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       I had a lengthy discussion with Mr Laduke regarding EVAR and type II endoleak.  This included: the surgical trade-off of open vs EVAR repair, with decreased up-front mortality/morbidity of EVAR yielding to a known increased rate of secondary intervention, the natural history and cited risk of rupture of a type II endoleak (~2% by [employer] data), active surveillance vs treatment, potential treatment options that would include trans-arterial, trans-venous, or direct sac access, and other risks of angiogram/embolization.  Additional risks include: bleeding, infection, arterial dissection, need for further surgery/procedure, hospitalization, contrast reaction, kidney injury, non-target embolization, cardiopulmonary collapse, death.     Regarding active surveillance, I did discuss with him my concern that there has been at least 64m of growth over short time in the setting of the ongoing endoleak.  My recommendation would be for trans-arterial treatment.     After our discussion, he would like to proceed.  He did tell me that it might take him a few weeks to arrange a good day, given his business.    I answered all of his questions.     Plan: - Aorto-peripheral angiogram and possible embolization of type II endoleak[facility]  Moderate sedation - We will need to temporarily withdraw from any anti-coagulation for our treatment.  - I have advised him to continue medical care.    Thank you for this interesting consult.  I greatly enjoyed meeting DJOVAUN LEVENEand look forward to participating in their care.  A copy of this report was sent to the requesting provider on this date.   Electronically Signed: JCorrie Mckusick5/26/2021, 10:05 AM"    Assessment: Encounter Diagnosis  Name Primary?  . Lesion of pancreas Yes    Small, stable, asymptomatic distal pancreatic lesion, almost certainly benign based on description and radiographic stability.  Explained in detail, and they were  reassured.  It does not need any further imaging in and of itself, though I expect he is likely to have future abdominal imaging regarding his aneurysm and renal lesion. Plan:  See me as needed.  Total time 45 minutes, extensive history and data to review.  Thank you for the courtesy of this consult.  Please call me with any questions or concerns.  HNelida MeuseIII  CC: Referring provider noted above

## 2020-05-21 NOTE — Progress Notes (Signed)
Cardiology Clinic Note   Patient Name: Kenneth Scott Date of Encounter: 05/22/2020  Primary Care Provider:  Curly Rim, MD Primary Cardiologist:  Shelva Majestic, MD  Patient Profile    Kenneth Scott 74 year old male presents to the clinic today for preoperative cardiac evaluation and follow-up of his CAD, hypertension, and AAA.  Past Medical History    Past Medical History:  Diagnosis Date  . AAA (abdominal aortic aneurysm) ([place]) 03/04/13   aortic duplex dopler- when compared tp previous study ther is no change. aneurysmal dilation measuring 3.5 x 3.4 cemtimeters  . Anxiety   . Chronic anticoagulation   . Coronary artery disease 04/23/13   NUC STRESS TEST-EF 52% High risk scan two separate perfusion defects and a suggestion of reveribility in the inferior-inferolateral wall.  . Gout   . History of DVT (deep vein thrombosis)   . History of kidney stones   . Hyperlipidemia   . Hypertension 09/06/12   ECHO-EF 45%   . Metatarsalgia of both feet   . Pancreatic cyst   . Pancreatic lesion   . PE (pulmonary embolism)   . PVD (peripheral vascular disease) ([place])   . Sleep apnea    No cpap  . TIA (transient ischemic attack)    Past Surgical History:  Procedure Laterality Date  . ABDOMINAL AORTIC ENDOVASCULAR STENT GRAFT  01/02/2019  . ABDOMINAL AORTIC ENDOVASCULAR STENT GRAFT N/A 01/02/2019   Procedure: ABDOMINAL AORTIC ENDOVASCULAR STENT GRAFT;  Surgeon: Serafina Mitchell, MD;  Location: [facility] OR;  Service: Vascular;  Laterality: N/A;  . AORTIC VALVE REPLACEMENT     Using a 25 mm homograft with reimplanation of his coronary arteries  . BACK SURGERY    . CORONARY ARTERY BYPASS GRAFT  2005   By Dr Cyndia Bent, He had a LIMA placed to the LAD, a vien to the diagonal, a vein to the marginal, a vein to the 2nd diagonal, a vein  to the PDA, PLA vessel.  . IR RADIOLOGIST EVAL & MGMT  04/22/2020    Allergies  No Known Allergies  History of Present Illness    Mr. Kenneth Scott has a  PMH of coronary artery disease status post anterior MI in 2005.  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  His nuclear stress test 2014 showed a fixed defect at the apex consistent with previous MI.  Mild reversibility in the inferior, inferior lateral and lateral area were noted however, these were not significant changes from his prior study.  His echocardiogram 9/17 showed an EF of 50-55%, akinesis of the mid apical anteroseptal myocardium, G2 DD, mild LAE, mild AI.  He has been followed by vascular surgery.  Recent carotid Dopplers obtained on 1/20 showed mild carotid disease and bilateral ICA.  Bilateral ABI were normal.  A CT angiogram of the abdomen showed previous abdominal aortic aneurysm had increased from 3.4 cm to 5.2 cm.  Due to the size of AAA he underwent endovascular repair.  He presented to the clinic on 12/28/2018 and was seen by Almyra Deforest, PA-C.  During that time he denied chest pain, increased dyspnea with exertion.  A nuclear stress test was  recommended prior to surgery.  It showed no ischemia, and old infarct pattern with an EF of 55%.  He presents the clinic today for follow-up evaluation and preoperative cardiac clearance.  He states he feels well.  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  He states that his son's have asked him to cut back on his workload however, he enjoys working.  He states that his diet is good but he has some indiscretion with salt.  He denies activity intolerance, exertional chest discomfort, and  DOE.  I will order a lipid panel and BMP today.  I will give him salty 6 diet sheet, clear him for his upcoming surgery and have him follow-up with Dr. Claiborne Billings in 6 months.  His Coumadin is prescribed by his PCP.  He will be to receive clearance for holding his Coumadin from their  office.  Today he denies chest pain, shortness of breath, lower extremity edema, fatigue, palpitations, melena, hematuria, hemoptysis, diaphoresis, weakness, presyncope, syncope, orthopnea, and PND.   Home Medications    Prior to Admission medications   Medication Sig Start Date End Date Taking? Authorizing Provider  allopurinol (ZYLOPRIM) 300 MG tablet Take 300 mg by mouth daily.  07/25/16   [provider]  aspirin 81 MG tablet Take 81 mg by mouth daily.    [provider]  Coenzyme Q10 (CO Q-10) 200 MG CAPS Take 200 mg by mouth daily.    [provider]  colchicine 0.6 MG tablet Take 0.6 mg by mouth daily as needed (gout).     [provider]  escitalopram (LEXAPRO) 10 MG tablet Take 5 mg by mouth daily.     [provider]  furosemide (LASIX) 40 MG tablet Take 20 mg by mouth daily.    [provider]  irbesartan-hydrochlorothiazide (AVALIDE) 150-12.5 MG per tablet Take 0.5 tablets by mouth daily.     [provider]  metoprolol succinate (TOPROL-XL) 25 MG 24 hr tablet TAKE 1/2 TABLET BY MOUTH EVERY DAY 05/09/19   Almyra Deforest, PA  nitroGLYCERIN (NITROSTAT) 0.4 MG SL tablet PLACE 1 TABLET (0.4 MG TOTAL) UNDER THE TONGUE EVERY 5 (FIVE) MINUTES AS NEEDED FOR CHEST PAIN. 10/17/17   Troy Sine, MD  pravastatin (PRAVACHOL) 20 MG tablet Take 20 mg by mouth daily.  07/25/16   [provider]  predniSONE (DELTASONE) 10 MG tablet Take 10 mg by mouth as needed (gout flare up).  03/31/13   [provider]  warfarin (COUMADIN) 5 MG tablet Take 5 mg by mouth daily.     [provider]    Family History    Family History  Problem Relation Age of Onset  . Heart disease Mother   . Heart disease Father   . Cancer Sister   . Heart disease Sister   . Heart disease Brother    He indicated that his mother is deceased. He indicated that his father is deceased. He indicated that his sister is deceased. He indicated  that the status of his brother is unknown.  Social History    Social History   Socioeconomic History  . Marital status: Divorced    Spouse name: Not on file  . Number of children: 2  . Years of education: Not on file  . Highest education level: Not on file  Occupational History  . Occupation: self employed logger  Tobacco Use  . Smoking status: Never Smoker  . Smokeless tobacco: Never Used  Vaping Use  . Vaping Use: Never used  Substance and Sexual Activity  . Alcohol use: No  . Drug use: No  . Sexual activity: Not on file  Other Topics Concern  . Not on file  Social History Narrative  . Not on file   Social Determinants of Health   Financial Resource Strain:   . Difficulty of Paying Living Expenses:   Food Insecurity:   . Worried About Charity fundraiser in the Last Year:   . Arboriculturist in the Last Year:   Transportation Needs:   . Film/video editor (Medical):   Marland Kitchen Lack of Transportation (Non-Medical):   Physical Activity:   . Days of Exercise per Week:   . Minutes of Exercise per Session:   Stress:   . Feeling of Stress :   Social Connections:   . Frequency of Communication with Friends and Family:   . Frequency of Social Gatherings with Friends and Family:   . Attends Religious Services:   . Active Member of Clubs or Organizations:   . Attends Archivist Meetings:   Marland Kitchen Marital Status:   Intimate Partner Violence:   . Fear of Current or Ex-Partner:   . Emotionally Abused:   Marland Kitchen Physically Abused:   . Sexually Abused:      Review of Systems    General:  No chills, fever, night sweats or weight changes.  Cardiovascular:  No chest pain, dyspnea on exertion, edema, orthopnea, palpitations, paroxysmal nocturnal dyspnea. Dermatological: No rash, lesions/masses Respiratory: No cough, dyspnea Urologic: No hematuria, dysuria Abdominal:   No nausea, vomiting, diarrhea, bright red blood per rectum, melena, or hematemesis Neurologic:  No visual  changes, wkns, changes in mental status. All other systems reviewed and are otherwise negative except as noted above.  Physical Exam    VS:  BP 130/74   Pulse (!) 57   Temp (!) 95.6 F (35.3 C)   Ht 5\' 11"  (1.803 m)   Wt 211 lb 9.6 oz (96 kg)   SpO2 95%   BMI 29.51 kg/m  , BMI Body mass index is 29.51 kg/m. GEN: Well nourished, well developed, in no acute distress. HEENT: normal. Neck: Supple, no JVD, carotid bruits, or masses. Cardiac: RRR, no murmurs, rubs, or gallops. No clubbing, cyanosis, edema.  Radials/DP/PT 2+ and equal bilaterally.  Respiratory:  Respirations regular and unlabored, clear to auscultation bilaterally. GI: Soft, nontender, nondistended, BS + x 4. MS: no deformity or atrophy. Skin: warm and dry, no rash. Neuro:  Strength and sensation are intact. Psych: Normal affect.  Accessory Clinical Findings    ECG personally reviewed by me today-sinus bradycardia with sinus arrhythmia 57 bpm- No acute changes  EKG 12/28/2018 Sinus bradycardia 47 bpm  Echocardiogram 08/12/2016 Study Conclusions   - Left ventricle: The cavity size was normal. Wall thickness was  increased in a pattern of mild LVH. Systolic function was normal.  The estimated ejection fraction was in the range of 50% to 55%.  There is akinesis of the mid-apicalanteroseptal myocardium.  Features are consistent with a pseudonormal left ventricular  filling pattern, with concomitant abnormal relaxation and  increased filling pressure (grade 2 diastolic dysfunction).  - Aortic valve: An aortic root homograft was present. There was  mild regurgitation.  - Left atrium: The atrium was mildly dilated.  Assessment & Plan   1.  Essential hypertension-BP today 130/74.   Continue metoprolol, Avalide Heart healthy low-sodium diet-salty 6 given Increase physical activity as tolerated Order BMP  History of DVT/PE-Coumadin managed by PCP office Continue Coumadin  History of thoracic  aortic aneurysm-no increased DOE or activity intolerance status post aortic resection and AVR.  Coronary artery disease-no chest pain today.  Status post  CABG x6 Continue continue aspirin, Avalide, metoprolol, nitroglycerin Heart healthy low-sodium diet-salty 6 given Increase physical activity as tolerated  Hyperlipidemia-LDL 80 on 07/29/2016 Continue statin Heart healthy low-sodium high-fiber diet Increase physical activity as tolerated Order lipid panel  Preoperative cardiac evaluation-aortic peripheral angiogram and possible embolization of type II endoleak, Dr. Damita Dunnings fax level [phone].  Coumadin managed by PCP      Primary Cardiologist: Shelva Majestic, MD  Chart reviewed as part of pre-operative protocol coverage. Given past medical history and time since last visit, based on ACC/AHA guidelines, NIKALAS BRAMEL would be at acceptable risk for the planned procedure without further cardiovascular testing.   His RCRI is a class IV risk, 11% risk of major cardiac event.  He is able to complete greater than four METS of physical activity  I will route this recommendation to the requesting party via Epic fax function and remove from pre-op pool.  Please call with questions.   Disposition: Follow-up with Dr. Claiborne Billings in 6 months.  Jossie Ng. Chima Astorino NP-C    05/22/2020, 8:22 AM [address] [address] Office [phone] Fax [phone]

## 2020-05-22 ENCOUNTER — Encounter: Payer: Self-pay | Admitting: General Practice

## 2020-05-22 ENCOUNTER — Other Ambulatory Visit: Payer: Self-pay

## 2020-05-22 ENCOUNTER — Ambulatory Visit (INDEPENDENT_AMBULATORY_CARE_PROVIDER_SITE_OTHER): Payer: Medicare Other | Admitting: General Practice

## 2020-05-22 VITALS — BP 130/74 | HR 57 | Temp 95.6°F | Ht 71.0 in | Wt 211.6 lb

## 2020-05-22 DIAGNOSIS — I25810 Atherosclerosis of coronary artery bypass graft(s) without angina pectoris: Secondary | ICD-10-CM

## 2020-05-22 DIAGNOSIS — I714 Abdominal aortic aneurysm, without rupture, unspecified: Secondary | ICD-10-CM

## 2020-05-22 DIAGNOSIS — E785 Hyperlipidemia, unspecified: Secondary | ICD-10-CM

## 2020-05-22 DIAGNOSIS — Z01810 Encounter for preprocedural cardiovascular examination: Secondary | ICD-10-CM | POA: Diagnosis not present

## 2020-05-22 DIAGNOSIS — I10 Essential (primary) hypertension: Secondary | ICD-10-CM

## 2020-05-22 DIAGNOSIS — I2609 Other pulmonary embolism with acute cor pulmonale: Secondary | ICD-10-CM

## 2020-05-22 DIAGNOSIS — Z79899 Other long term (current) drug therapy: Secondary | ICD-10-CM

## 2020-05-22 LAB — LIPID PANEL
Chol/HDL Ratio: 5 ratio (ref 0.0–5.0)
Cholesterol, Total: 146 mg/dL (ref 100–199)
HDL: 29 mg/dL — ABNORMAL LOW (ref >39)
LDL Chol Calc (NIH): 83 mg/dL (ref 0–99)
Triglycerides: 201 mg/dL — ABNORMAL HIGH (ref 0–149)
VLDL Cholesterol Cal: 34 mg/dL (ref 5–40)

## 2020-05-22 LAB — BASIC METABOLIC PANEL
BUN/Creatinine Ratio: 13 (ref 10–24)
BUN: 16 mg/dL (ref 8–27)
CO2: 26 mmol/L (ref 20–29)
Calcium: 9.8 mg/dL (ref 8.6–10.2)
Chloride: 102 mmol/L (ref 96–106)
Creatinine, Ser: 1.21 mg/dL (ref 0.76–1.27)
GFR calc Af Amer: 67 mL/min/{1.73_m2} (ref >59)
GFR calc non Af Amer: 58 mL/min/{1.73_m2} — ABNORMAL LOW (ref >59)
Glucose: 84 mg/dL (ref 65–99)
Potassium: 4.4 mmol/L (ref 3.5–5.2)
Sodium: 142 mmol/L (ref 134–144)

## 2020-05-22 NOTE — Patient Instructions (Signed)
Medication Instructions:  The current medical regimen is effective;  continue present plan and medications as directed. Please refer to the Current Medication list given to you today. *If you need a refill on your cardiac medications before your next appointment, please call your pharmacy*  Lab Work: Lipid and bmet today If you have labs (blood work) drawn today and your tests are completely normal, you will receive your results only by:  [place] (if you have MyChart) OR A paper copy in the mail.  If you have any lab test that is abnormal or we need to change your treatment, we will call you to review the results. You may go to any [facility] that is convenient for you however, we do have a lab in our office that is able to assist you. You DO NOT need an appointment for our lab. The lab is open 8:00am and closes at 4:00pm. Lunch 12:45 - 1:45pm.  Special Instructions CLEARED FOR ANGIOGRAM  PLEASE READ AND FOLLOW SALTY 6-ATTACHED  Follow-Up: Your next appointment:  6 month(s)  In Person with Shelva Majestic, MD  At [facility], you and your health needs are our priority.  As part of our continuing mission to provide you with exceptional heart care, we have created designated Provider Care Teams.  These Care Teams include your primary Cardiologist (physician) and Advanced Practice Providers (APPs -  Physician Assistants and Nurse Practitioners) who all work together to provide you with the care you need, when you need it.  We recommend signing up for the patient portal called "MyChart".  Sign up information is provided on this After Visit Summary.  MyChart is used to connect with patients for Virtual Visits (Telemedicine).  Patients are able to view lab/test results, encounter notes, upcoming appointments, etc.  Non-urgent messages can be sent to your provider as well.   To learn more about what you can do with MyChart, go to [email or web address].

## 2020-05-26 ENCOUNTER — Other Ambulatory Visit (HOSPITAL_COMMUNITY): Payer: Self-pay | Admitting: Interventional Radiology

## 2020-05-26 DIAGNOSIS — I714 Abdominal aortic aneurysm, without rupture, unspecified: Secondary | ICD-10-CM

## 2020-05-27 ENCOUNTER — Other Ambulatory Visit: Payer: Self-pay

## 2020-06-02 ENCOUNTER — Other Ambulatory Visit: Payer: Self-pay | Admitting: Physician Assistant

## 2020-06-08 ENCOUNTER — Telehealth: Payer: Self-pay | Admitting: Cardiovascular Disease

## 2020-06-08 ENCOUNTER — Other Ambulatory Visit: Payer: Self-pay

## 2020-06-08 DIAGNOSIS — E782 Mixed hyperlipidemia: Secondary | ICD-10-CM

## 2020-06-08 DIAGNOSIS — Z79899 Other long term (current) drug therapy: Secondary | ICD-10-CM

## 2020-06-08 NOTE — Progress Notes (Signed)
Deberah Pelton, NP  05/23/2020 9:22 AM EDT     Please contact Mr. Warren and let him know that his lab values from yesterday have been reviewed. His triglycerides are elevated at 201. Please encourage him to eat a low-sodium heart healthy high-fiber diet. I will add ezetimibe 10 mg daily to his medication regimen. We will recheck his lipid panel in 8 weeks. Thank you.  Future lipid ordered due 08-08-2020

## 2020-06-08 NOTE — Telephone Encounter (Signed)
Patient called to return Michelle's phone call, transferred patient to [place].

## 2020-06-10 ENCOUNTER — Other Ambulatory Visit: Payer: Self-pay | Admitting: Radiology

## 2020-06-11 ENCOUNTER — Other Ambulatory Visit: Payer: Self-pay | Admitting: Radiology

## 2020-06-11 ENCOUNTER — Other Ambulatory Visit: Payer: Self-pay | Admitting: Student

## 2020-06-12 ENCOUNTER — Other Ambulatory Visit (HOSPITAL_COMMUNITY): Payer: Self-pay | Admitting: Interventional Radiology

## 2020-06-12 ENCOUNTER — Ambulatory Visit (HOSPITAL_COMMUNITY)
Admission: RE | Admit: 2020-06-12 | Discharge: 2020-06-12 | Disposition: A | Discharge status: Home / Self Care | Payer: Medicare Other | Source: Ambulatory Visit | Attending: Interventional Radiology | Admitting: Interventional Radiology

## 2020-06-12 ENCOUNTER — Other Ambulatory Visit: Payer: Self-pay

## 2020-06-12 ENCOUNTER — Encounter (HOSPITAL_COMMUNITY): Payer: Self-pay

## 2020-06-12 DIAGNOSIS — Z8673 Personal history of transient ischemic attack (TIA), and cerebral infarction without residual deficits: Secondary | ICD-10-CM | POA: Insufficient documentation

## 2020-06-12 DIAGNOSIS — Z79899 Other long term (current) drug therapy: Secondary | ICD-10-CM | POA: Diagnosis not present

## 2020-06-12 DIAGNOSIS — I714 Abdominal aortic aneurysm, without rupture, unspecified: Secondary | ICD-10-CM

## 2020-06-12 DIAGNOSIS — E785 Hyperlipidemia, unspecified: Secondary | ICD-10-CM | POA: Diagnosis not present

## 2020-06-12 DIAGNOSIS — Z7901 Long term (current) use of anticoagulants: Secondary | ICD-10-CM | POA: Diagnosis not present

## 2020-06-12 DIAGNOSIS — I10 Essential (primary) hypertension: Secondary | ICD-10-CM | POA: Diagnosis not present

## 2020-06-12 DIAGNOSIS — F419 Anxiety disorder, unspecified: Secondary | ICD-10-CM | POA: Diagnosis not present

## 2020-06-12 DIAGNOSIS — Z7982 Long term (current) use of aspirin: Secondary | ICD-10-CM | POA: Insufficient documentation

## 2020-06-12 DIAGNOSIS — I2510 Atherosclerotic heart disease of native coronary artery without angina pectoris: Secondary | ICD-10-CM | POA: Diagnosis not present

## 2020-06-12 DIAGNOSIS — G4730 Sleep apnea, unspecified: Secondary | ICD-10-CM | POA: Insufficient documentation

## 2020-06-12 DIAGNOSIS — M109 Gout, unspecified: Secondary | ICD-10-CM | POA: Diagnosis not present

## 2020-06-12 DIAGNOSIS — Z86718 Personal history of other venous thrombosis and embolism: Secondary | ICD-10-CM | POA: Diagnosis not present

## 2020-06-12 HISTORY — PX: IR US GUIDE VASC ACCESS LEFT: IMG2389

## 2020-06-12 HISTORY — PX: IR EMBO ARTERIAL NOT HEMORR HEMANG INC GUIDE ROADMAPPING: IMG5448

## 2020-06-12 HISTORY — PX: IR ANGIOGRAM SELECTIVE EACH ADDITIONAL VESSEL: IMG667

## 2020-06-12 HISTORY — PX: IR ANGIOGRAM VISCERAL SELECTIVE: IMG657

## 2020-06-12 HISTORY — PX: IR ANGIOGRAM PELVIS SELECTIVE OR SUPRASELECTIVE: IMG661

## 2020-06-12 LAB — CBC
HCT: 45.6 % (ref 39.0–52.0)
Hemoglobin: 15.2 g/dL (ref 13.0–17.0)
MCH: 34.6 pg — ABNORMAL HIGH (ref 26.0–34.0)
MCHC: 33.3 g/dL (ref 30.0–36.0)
MCV: 103.9 fL — ABNORMAL HIGH (ref 80.0–100.0)
Platelets: 252 10*3/uL (ref 150–400)
RBC: 4.39 MIL/uL (ref 4.22–5.81)
RDW: 13 % (ref 11.5–15.5)
WBC: 10.9 10*3/uL — ABNORMAL HIGH (ref 4.0–10.5)
nRBC: 0 % (ref 0.0–0.2)

## 2020-06-12 LAB — PROTIME-INR
INR: 1.1 (ref 0.8–1.2)
Prothrombin Time: 14.2 seconds (ref 11.4–15.2)

## 2020-06-12 MED ORDER — IOHEXOL 300 MG/ML  SOLN
150.0000 mL | Freq: Once | INTRAMUSCULAR | Status: AC | PRN
  Administered 2020-06-12: 50 mL via INTRA_ARTERIAL

## 2020-06-12 MED ORDER — MIDAZOLAM HCL 2 MG/2ML IJ SOLN
INTRAMUSCULAR | Status: AC | PRN
  Administered 2020-06-12: .25 mg via INTRAVENOUS
  Administered 2020-06-12: 0.5 mg via INTRAVENOUS
  Administered 2020-06-12: .25 mg via INTRAVENOUS
  Administered 2020-06-12 (×2): 0.5 mg via INTRAVENOUS
  Administered 2020-06-12: .25 mg via INTRAVENOUS

## 2020-06-12 MED ORDER — LIDOCAINE HCL 1 % IJ SOLN
INTRAMUSCULAR | Status: AC
  Filled 2020-06-12: qty 20

## 2020-06-12 MED ORDER — SODIUM CHLORIDE 0.9 % IV SOLN: INTRAVENOUS | Status: DC

## 2020-06-12 MED ORDER — FENTANYL CITRATE (PF) 100 MCG/2ML IJ SOLN
INTRAMUSCULAR | Status: AC | PRN
  Administered 2020-06-12: 25 ug via INTRAVENOUS
  Administered 2020-06-12 (×3): 12.5 ug via INTRAVENOUS
  Administered 2020-06-12 (×2): 25 ug via INTRAVENOUS

## 2020-06-12 MED ORDER — MIDAZOLAM HCL 2 MG/2ML IJ SOLN
INTRAMUSCULAR | Status: AC
  Filled 2020-06-12: qty 4

## 2020-06-12 MED ORDER — IOHEXOL 300 MG/ML  SOLN
150.0000 mL | Freq: Once | INTRAMUSCULAR | Status: AC | PRN
  Administered 2020-06-12: 75 mL via INTRA_ARTERIAL

## 2020-06-12 MED ORDER — FENTANYL CITRATE (PF) 100 MCG/2ML IJ SOLN
INTRAMUSCULAR | Status: AC
  Filled 2020-06-12: qty 4

## 2020-06-12 NOTE — Discharge Instructions (Signed)
Femoral Site Care This sheet gives you information about how to care for yourself after your procedure. Your health care provider may also give you more specific instructions. If you have problems or questions, contact your health care provider. What can I expect after the procedure?  After the procedure, it is common to have:  Bruising that usually fades within 1-2 weeks.  Tenderness at the site. Follow these instructions at home: Wound care 1. Follow instructions from your health care provider about how to take care of your insertion site. Make sure you: ? Wash your hands with soap and water before you change your bandage (dressing). If soap and water are not available, use hand sanitizer. ? Remove your dressing as told by your health care provider. In 24 hours 2. Do not take baths, swim, or use a hot tub until your health care provider approves. 3. You may shower 24-48 hours after the procedure or as told by your health care provider. ? Gently wash the site with plain soap and water. ? Pat the area dry with a clean towel. ? Do not rub the site. This may cause bleeding. 4. Do not apply powder or lotion to the site. Keep the site clean and dry. 5. Check your femoral site every day for signs of infection. Check for: ? Redness, swelling, or pain. ? Fluid or blood. ? Warmth. ? Pus or a bad smell. Activity 1. For the first 2-3 days after your procedure, or as long as directed: ? Avoid climbing stairs as much as possible. ? Do not squat. 2. Do not lift anything that is heavier than 10 lb (4.5 kg), or the limit that you are told, until your health care provider says that it is safe. For 5 days 3. Rest as directed. ? Avoid sitting for a long time without moving. Get up to take short walks every 1-2 hours. 4. Do not drive for 24 hours if you were given a medicine to help you relax (sedative). General instructions  Take over-the-counter and prescription medicines only as told by your  health care provider.  Keep all follow-up visits as told by your health care provider. This is important. Contact a health care provider if you have:  A fever or chills.  You have redness, swelling, or pain around your insertion site. Get help right away if:  The catheter insertion area swells very fast.  You pass out.  You suddenly start to sweat or your skin gets clammy.  The catheter insertion area is bleeding, and the bleeding does not stop when you hold steady pressure on the area.  The area near or just beyond the catheter insertion site becomes pale, cool, tingly, or numb. These symptoms may represent a serious problem that is an emergency. Do not wait to see if the symptoms will go away. Get medical help right away. Call your local emergency services (911 in the [place] Do not drive yourself to the hospital. Summary  After the procedure, it is common to have bruising that usually fades within 1-2 weeks.  Check your femoral site every day for signs of infection.  Do not lift anything that is heavier than 10 lb (4.5 kg), or the limit that you are told, until your health care provider says that it is safe. This information is not intended to replace advice given to you by your health care provider. Make sure you discuss any questions you have with your health care provider. Document Revised: 11/27/2017 Document Reviewed: 11/27/2017   Elsevier Patient Education  2020 [employer]   

## 2020-06-12 NOTE — H&P (Signed)
Chief Complaint: Patient was seen in consultation today for Type II endoleak  Referring Physician(s): Dr. Arita Miss  Supervising Physician: Corrie Mckusick  Patient Status: [facility] - Out-pt  History of Present Illness: Kenneth Scott is a 74 y.o. male with history of anxiety, CAD, DVT, HLD, HTN who presents for planned intervention of his infrarenal AAA.  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  Kenneth Scott met with Dr. Earleen Newport in consultation 04/22/20 to discuss intervention of his endoleak. He elected to proceed.  He presents to [facility] today in his usual state of health.  Denies new back or abdominal pain.  He has been NPO.  He does take coumadin, but has held since Sunday.   Past Medical History:  Diagnosis Date  . AAA (abdominal aortic aneurysm) ([place]) 03/04/13   aortic duplex dopler- when compared tp previous study ther is no change. aneurysmal dilation measuring 3.5 x 3.4 cemtimeters  . Anxiety   . Chronic anticoagulation   . Coronary artery disease 04/23/13   NUC STRESS TEST-EF 52% High risk scan two separate perfusion defects and a suggestion of reveribility in the inferior-inferolateral wall.  . Gout   . History of DVT (deep vein thrombosis)   . History of kidney stones   . Hyperlipidemia   . Hypertension 09/06/12   ECHO-EF 45%   . Metatarsalgia of both feet   . Pancreatic cyst   . Pancreatic lesion   . PE (pulmonary embolism)   . PVD (peripheral vascular disease) ([place])   . Sleep apnea    No cpap  . TIA (transient ischemic attack)     Past Surgical History:  Procedure Laterality Date  . ABDOMINAL AORTIC ENDOVASCULAR STENT GRAFT  01/02/2019  . ABDOMINAL AORTIC ENDOVASCULAR STENT GRAFT N/A 01/02/2019   Procedure: ABDOMINAL AORTIC ENDOVASCULAR STENT GRAFT;  Surgeon: Serafina Mitchell, MD;  Location: [facility] OR;  Service: Vascular;  Laterality: N/A;  . AORTIC  VALVE REPLACEMENT     Using a 25 mm homograft with reimplanation of his coronary arteries  . BACK SURGERY    . CORONARY ARTERY BYPASS GRAFT  2005   By Dr Cyndia Bent, He had a LIMA placed to the LAD, a vien to the diagonal, a vein to the marginal, a vein to the 2nd diagonal, a vein  to the PDA, PLA vessel.  . IR RADIOLOGIST EVAL & MGMT  04/22/2020    Allergies: Patient has no known allergies.  Medications: Prior to Admission medications   Medication Sig Start Date End Date Taking? Authorizing Provider  allopurinol (ZYLOPRIM) 300 MG tablet Take 300 mg by mouth daily.  07/25/16  Yes [provider]  aspirin 81 MG tablet Take 81 mg by mouth daily.   Yes [provider]  Coenzyme Q10 (CO Q-10) 200 MG CAPS Take 200 mg by mouth daily.   Yes [provider]  colchicine 0.6 MG tablet Take 0.6 mg by mouth daily as needed (gout).    Yes [provider]  escitalopram (LEXAPRO) 10 MG tablet Take 5 mg by mouth daily.    Yes [provider]  furosemide (LASIX) 40 MG tablet Take 20 mg by mouth daily as needed for fluid.    Yes [provider]  irbesartan-hydrochlorothiazide (AVALIDE) 150-12.5 MG per tablet Take 0.5 tablets by mouth daily.    Yes [provider]  metoprolol succinate (TOPROL-XL) 25 MG 24 hr  tablet TAKE 1/2 TABLET BY MOUTH EVERY DAY Patient taking differently: Take 12.5 mg by mouth daily.  06/02/20  Yes Troy Sine, MD  pravastatin (PRAVACHOL) 20 MG tablet Take 20 mg by mouth daily.  07/25/16  Yes [provider]  predniSONE (DELTASONE) 10 MG tablet Take 10 mg by mouth as needed (gout flare up).  03/31/13  Yes [provider]  warfarin (COUMADIN) 5 MG tablet Take 5 mg by mouth daily.    Yes [provider]  nitroGLYCERIN (NITROSTAT) 0.4 MG SL tablet PLACE 1 TABLET (0.4 MG TOTAL) UNDER THE TONGUE EVERY 5 (FIVE) MINUTES AS NEEDED FOR CHEST PAIN. Patient taking differently: Place 0.4 mg under the tongue every 5  (five) minutes as needed for chest pain. PLACE 1 TABLET (0.4 MG TOTAL) UNDER THE TONGUE EVERY 5 (FIVE) MINUTES AS NEEDED FOR CHEST PAIN. 10/17/17   Troy Sine, MD     Family History  Problem Relation Age of Onset  . Heart disease Mother   . Heart disease Father   . Cancer Sister   . Heart disease Sister   . Heart disease Brother     Social History   Socioeconomic History  . Marital status: Divorced    Spouse name: Not on file  . Number of children: 2  . Years of education: Not on file  . Highest education level: Not on file  Occupational History  . Occupation: self employed logger  Tobacco Use  . Smoking status: Never Smoker  . Smokeless tobacco: Never Used  Vaping Use  . Vaping Use: Never used  Substance and Sexual Activity  . Alcohol use: No  . Drug use: No  . Sexual activity: Not on file  Other Topics Concern  . Not on file  Social History Narrative  . Not on file   Social Determinants of Health   Financial Resource Strain:   . Difficulty of Paying Living Expenses:   Food Insecurity:   . Worried About Charity fundraiser in the Last Year:   . Arboriculturist in the Last Year:   Transportation Needs:   . Film/video editor (Medical):   Marland Kitchen Lack of Transportation (Non-Medical):   Physical Activity:   . Days of Exercise per Week:   . Minutes of Exercise per Session:   Stress:   . Feeling of Stress :   Social Connections:   . Frequency of Communication with Friends and Family:   . Frequency of Social Gatherings with Friends and Family:   . Attends Religious Services:   . Active Member of Clubs or Organizations:   . Attends Archivist Meetings:   Marland Kitchen Marital Status:      Review of Systems: A 12 point ROS discussed and pertinent positives are indicated in the HPI above.  All other systems are negative.  Review of Systems  Constitutional: Negative for fatigue and fever.  Respiratory: Negative for cough and shortness of breath.     Cardiovascular: Negative for chest pain.  Gastrointestinal: Negative for abdominal pain, nausea and vomiting.  Genitourinary: Negative for dysuria.  Musculoskeletal: Negative for back pain.  Psychiatric/Behavioral: Negative for behavioral problems and confusion.    Vital Signs: BP (!) 116/50   Pulse 64   Temp 98.7 F (37.1 C) (Oral)   Resp 16   Ht 5' 11" (1.803 m)   Wt 212 lb (96.2 kg)   SpO2 96%   BMI 29.57 kg/m   Physical Exam Vitals and nursing  note reviewed.  Constitutional:      General: He is not in acute distress.    Appearance: Normal appearance. He is not ill-appearing.  Cardiovascular:     Rate and Rhythm: Normal rate and regular rhythm.     Pulses: Normal pulses.  Pulmonary:     Effort: Pulmonary effort is normal. No respiratory distress.     Breath sounds: Normal breath sounds.  Abdominal:     General: Abdomen is flat.     Palpations: Abdomen is soft.  Skin:    General: Skin is warm and dry.  Neurological:     General: No focal deficit present.     Mental Status: He is alert and oriented to person, place, and time. Mental status is at baseline.  Psychiatric:        Mood and Affect: Mood normal.        Behavior: Behavior normal.        Thought Content: Thought content normal.        Judgment: Judgment normal.   Distal pulses intact, DP identified by doppler bilaterally.    MD Evaluation Airway: WNL Heart: WNL Abdomen: WNL Chest/ Lungs: WNL ASA  Classification: 3 Mallampati/Airway Score: One   Imaging: No results found.  Labs:  CBC: Recent Labs    06/12/20 0744  WBC 10.9*  HGB 15.2  HCT 45.6  PLT 252    COAGS: Recent Labs    06/12/20 0744  INR 1.1    BMP: Recent Labs    05/22/20 0846  NA 142  K 4.4  CL 102  CO2 26  GLUCOSE 84  BUN 16  CALCIUM 9.8  CREATININE 1.21  GFRNONAA 58*  GFRAA 67    LIVER FUNCTION TESTS: No results for input(s): BILITOT, AST, ALT, ALKPHOS, PROT, ALBUMIN in the last 8760 hours.  TUMOR  MARKERS: No results for input(s): AFPTM, CEA, CA199, CHROMGRNA in the last 8760 hours.  Assessment and Plan: Patient with past medical history of intra-renal AAA s/p EVAR 01/02/19 presents with complaint of growing Type II endoleak by CT imaging.  IR consulted for embolization at the request of Dr. Arita Miss. Case reviewed by Dr. Earleen Newport who met with the patient in consultation 04/22/20 and approves patient for procedure.  Patient presents today in their usual state of health.  He has been NPO and is not currently on blood thinners as he appropriately held his coumadin. Note his baseline SCr is 1.2.  Will hydrate with procedure today.   Risks and benefits were discussed with the patient including, but not limited to bleeding, infection, vascular injury or contrast induced renal failure.  This interventional procedure involves the use of X-rays and because of the nature of the planned procedure, it is possible that we will have prolonged use of X-ray fluoroscopy.  Potential radiation risks to you include (but are not limited to) the following: - A slightly elevated risk for cancer  several years later in life. This risk is typically less than 0.5% percent. This risk is low in comparison to the normal incidence of human cancer, which is 33% for women and 50% for men according to the [place]. - Radiation induced injury can include skin redness, resembling a rash, tissue breakdown / ulcers and hair loss (which can be temporary or permanent).   The likelihood of either of these occurring depends on the difficulty of the procedure and whether you are sensitive to radiation due to previous procedures, disease, or genetic conditions.   IF  your procedure requires a prolonged use of radiation, you will be notified and given written instructions for further action.  It is your responsibility to monitor the irradiated area for the 2 weeks following the procedure and to notify your physician if  you are concerned that you have suffered a radiation induced injury.    All of the patient's questions were answered, patient is agreeable to proceed.  Consent signed and in chart.  Thank you for this interesting consult.  I greatly enjoyed meeting RODRIGUEZ AGUINALDO and look forward to participating in their care.  A copy of this report was sent to the requesting provider on this date.  Electronically Signed: Docia Barrier, PA 06/12/2020, 8:50 AM   I spent a total of    25 Minutes in face to face in clinical consultation, greater than 50% of which was counseling/coordinating care for Type II endoleak.

## 2020-06-12 NOTE — Sedation Documentation (Signed)
Dr Earleen Newport aware history of sleep apnea and etCO2 is 54

## 2020-06-12 NOTE — Procedures (Addendum)
Interventional Radiology Procedure Note  Procedure:   US guided access left CFA Aortic, mesenteric, left pelvic angiogram Treatment of type II endoleak via transarterial route through SMA->IMA collateral, with onyx liquid embo Deployment of angioseal for closure  Complications: None  Recommendations:  - Left hip straight x 4 hrs - advance diet - IV fluids post @ 50cc/hr x 4 hrs - routine wound care - follow up with Dr. Earleen Newport in ~4 weeks in clinic.  Plan for non-contrast CT abdomen/pelvis at 10mofollow up - Do not submerge for 7 days  - DC in 4 hrs when goals met - May restart coumadin tomorrow  Signed,  JDulcy Fanny WEarleen Newport DO

## 2020-06-16 ENCOUNTER — Other Ambulatory Visit: Payer: Self-pay

## 2020-06-16 MED ORDER — EZETIMIBE 10 MG PO TABS
10.0000 mg | ORAL_TABLET | Freq: Every day | ORAL | 3 refills | Status: AC
Start: 2020-06-16 — End: 2020-09-14

## 2020-06-17 ENCOUNTER — Other Ambulatory Visit: Payer: Self-pay | Admitting: Interventional Radiology

## 2020-06-17 DIAGNOSIS — IMO0002 Reserved for concepts with insufficient information to code with codable children: Secondary | ICD-10-CM

## 2020-07-08 ENCOUNTER — Encounter: Payer: Self-pay | Admitting: *Deleted

## 2020-07-08 ENCOUNTER — Ambulatory Visit
Admission: RE | Admit: 2020-07-08 | Discharge: 2020-07-08 | Disposition: A | Discharge status: Home / Self Care | Payer: Medicare Other | Source: Ambulatory Visit | Attending: Interventional Radiology | Admitting: Interventional Radiology

## 2020-07-08 ENCOUNTER — Other Ambulatory Visit: Payer: Self-pay

## 2020-07-08 DIAGNOSIS — IMO0002 Reserved for concepts with insufficient information to code with codable children: Secondary | ICD-10-CM

## 2020-07-08 HISTORY — PX: IR RADIOLOGIST EVAL & MGMT: IMG5224

## 2020-07-08 NOTE — Progress Notes (Signed)
Chief Complaint: Follow up after Endoleak Repair   Referring Physician(s): Brabham,Vance W  History of Present Illness: Kenneth Scott is a 74 y.o. male presenting as a scheduled follow up to [place] clinic today, for his post-operative visit after transarterial embolization of type II endoleak.   He joins [place] today by virtual visit, given Covid.  We confirmed his identity with 2 identifiers.   We treated him 06/12/2020 on out-patient basis at [facility], with left CFA approach for transarterial embolization.  The successful pathway was via the SMA --> IMA, and we embolized the sac with Onyx liquid embolic.    He was discharged same day and recovered uneventfully.  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  He denies any problem at the left femoral access site.  He denies any abdominal pain, new GI symptoms, or new leg pain/claudication.  He feels fine, and tells me he feels "no change".      Past Medical History:  Diagnosis Date  . AAA (abdominal aortic aneurysm) ([place]) 03/04/13   aortic duplex dopler- when compared tp previous study ther is no change. aneurysmal dilation measuring 3.5 x 3.4 cemtimeters  . Anxiety   . Chronic anticoagulation   . Coronary artery disease 04/23/13   [facility] STRESS TEST-EF 52% High risk scan two separate perfusion defects and a suggestion of reveribility in the inferior-inferolateral wall.  . Gout   . History of DVT (deep vein thrombosis)   . History of kidney stones   . Hyperlipidemia   . Hypertension 09/06/12   ECHO-EF 45%   . Metatarsalgia of both feet   . Pancreatic cyst   . Pancreatic lesion   . PE (pulmonary embolism)   . PVD (peripheral vascular disease) ([place])   . Sleep apnea    No cpap  . TIA (transient ischemic attack)     Past Surgical History:  Procedure Laterality Date  . ABDOMINAL AORTIC ENDOVASCULAR STENT GRAFT  01/02/2019  . ABDOMINAL AORTIC ENDOVASCULAR STENT GRAFT N/A 01/02/2019    Procedure: ABDOMINAL AORTIC ENDOVASCULAR STENT GRAFT;  Surgeon: Serafina Mitchell, MD;  Location: [facility] OR;  Service: Vascular;  Laterality: N/A;  . AORTIC VALVE REPLACEMENT     Using a 25 mm homograft with reimplanation of his coronary arteries  . BACK SURGERY    . CORONARY ARTERY BYPASS GRAFT  2005   By Dr Cyndia Bent, He had a LIMA placed to the LAD, a vien to the diagonal, a vein to the marginal, a vein to the 2nd diagonal, a vein  to the PDA, PLA vessel.  . IR ANGIOGRAM PELVIS SELECTIVE OR SUPRASELECTIVE  06/12/2020  . IR ANGIOGRAM SELECTIVE EACH ADDITIONAL VESSEL  06/12/2020  . IR ANGIOGRAM SELECTIVE EACH ADDITIONAL VESSEL  06/12/2020  . IR ANGIOGRAM SELECTIVE EACH ADDITIONAL VESSEL  06/12/2020  . IR ANGIOGRAM VISCERAL SELECTIVE  06/12/2020  . IR EMBO ARTERIAL NOT HEMORR HEMANG INC GUIDE ROADMAPPING  06/12/2020  . IR RADIOLOGIST EVAL & MGMT  04/22/2020  . IR US GUIDE VASC ACCESS LEFT  06/12/2020    Allergies: Patient has no known allergies.  Medications: Prior to Admission medications   Medication Sig Start Date End Date Taking? Authorizing Provider  allopurinol (ZYLOPRIM) 300 MG tablet Take 300 mg by mouth daily.  07/25/16   [provider]  aspirin 81 MG tablet Take 81 mg by mouth daily.    [provider]  Coenzyme Q10 (CO Q-10) 200 MG CAPS  Take 200 mg by mouth daily.    [provider]  colchicine 0.6 MG tablet Take 0.6 mg by mouth daily as needed (gout).     [provider]  escitalopram (LEXAPRO) 10 MG tablet Take 5 mg by mouth daily.     [provider]  ezetimibe (ZETIA) 10 MG tablet Take 1 tablet (10 mg total) by mouth daily. 06/16/20 09/14/20  Deberah Pelton, NP  furosemide (LASIX) 40 MG tablet Take 20 mg by mouth daily as needed for fluid.     [provider]  irbesartan-hydrochlorothiazide (AVALIDE) 150-12.5 MG per tablet Take 0.5 tablets by mouth daily.     [provider]  metoprolol succinate (TOPROL-XL) 25 MG 24 hr  tablet TAKE 1/2 TABLET BY MOUTH EVERY DAY Patient taking differently: Take 12.5 mg by mouth daily.  06/02/20   Troy Sine, MD  nitroGLYCERIN (NITROSTAT) 0.4 MG SL tablet PLACE 1 TABLET (0.4 MG TOTAL) UNDER THE TONGUE EVERY 5 (FIVE) MINUTES AS NEEDED FOR CHEST PAIN. Patient taking differently: Place 0.4 mg under the tongue every 5 (five) minutes as needed for chest pain. PLACE 1 TABLET (0.4 MG TOTAL) UNDER THE TONGUE EVERY 5 (FIVE) MINUTES AS NEEDED FOR CHEST PAIN. 10/17/17   Troy Sine, MD  pravastatin (PRAVACHOL) 20 MG tablet Take 20 mg by mouth daily.  07/25/16   [provider]  predniSONE (DELTASONE) 10 MG tablet Take 10 mg by mouth as needed (gout flare up).  03/31/13   [provider]  warfarin (COUMADIN) 5 MG tablet Take 5 mg by mouth daily.     [provider]     Family History  Problem Relation Age of Onset  . Heart disease Mother   . Heart disease Father   . Cancer Sister   . Heart disease Sister   . Heart disease Brother     Social History   Socioeconomic History  . Marital status: Divorced    Spouse name: Not on file  . Number of children: 2  . Years of education: Not on file  . Highest education level: Not on file  Occupational History  . Occupation: self employed logger  Tobacco Use  . Smoking status: Never Smoker  . Smokeless tobacco: Never Used  Vaping Use  . Vaping Use: Never used  Substance and Sexual Activity  . Alcohol use: No  . Drug use: No  . Sexual activity: Not on file  Other Topics Concern  . Not on file  Social History Narrative  . Not on file   Social Determinants of Health   Financial Resource Strain:   . Difficulty of Paying Living Expenses:   Food Insecurity:   . Worried About Charity fundraiser in the Last Year:   . Arboriculturist in the Last Year:   Transportation Needs:   . Film/video editor (Medical):   Marland Kitchen Lack of Transportation (Non-Medical):   Physical Activity:   . Days of Exercise per  Week:   . Minutes of Exercise per Session:   Stress:   . Feeling of Stress :   Social Connections:   . Frequency of Communication with Friends and Family:   . Frequency of Social Gatherings with Friends and Family:   . Attends Religious Services:   . Active Member of Clubs or Organizations:   . Attends Archivist Meetings:   Marland Kitchen Marital Status:       Review of Systems  Review of Systems:  A 12 point ROS discussed and pertinent positives are indicated in the HPI above.  All other systems are negative.  Physical Exam No direct physical exam was performed (except for noted visual exam findings with Video Visits).    Vital Signs: There were no vitals taken for this visit.  Imaging: IR Angiogram Visceral Selective  Result Date: 06/12/2020 INDICATION: 74 year old male with a history of endovascular repair of infrarenal abdominal aortic aneurysm with a type 2 endoleak and enlarging sac EXAM: ULTRASOUND-GUIDED ACCESS LEFT COMMON FEMORAL ARTERY MESENTERIC ANGIOGRAM LEFT PELVIC ANGIOGRAM TRANS ARTERIAL EMBOLIZATION OF TYPE 2 ENDOLEAK ANGIO-SEAL FOR HEMOSTASIS MEDICATIONS: NONE ANESTHESIA/SEDATION: Moderate (conscious) sedation was employed during this procedure. A total of Versed 2.25 mg and Fentanyl 112.5 mcg was administered intravenously. Moderate Sedation Time: 146 minutes. The patient's level of consciousness and vital signs were monitored continuously by radiology nursing throughout the procedure under my direct supervision. CONTRAST:  50mL OMNIPAQUE IOHEXOL 300 MG/ML SOLN, 71mL OMNIPAQUE IOHEXOL 300 MG/ML SOLN FLUOROSCOPY TIME:  Fluoroscopy Time: 44 minutes 42 seconds (2,981 mGy). COMPLICATIONS: None PROCEDURE: Informed consent was obtained from the patient following explanation of the procedure, risks, benefits and alternatives. The patient understands, agrees and consents for the procedure. All questions were addressed. A time out was performed prior to the initiation of the  procedure. Maximal barrier sterile technique utilized including caps, mask, sterile gowns, sterile gloves, large sterile drape, hand hygiene, and Betadine prep. Ultrasound survey of the left inguinal region was performed with images stored and sent to PACs, confirming patency of the vessel. 1% lidocaine was used for local anesthesia. Small stab incision was made with 11 blade scalpel. Blunt dissection was performed under ultrasound. A micropuncture needle was used access the left common femoral artery under ultrasound. With excellent arterial blood flow returned, and an .018 micro wire was passed through the needle, observed enter the abdominal aorta under fluoroscopy. The needle was removed, and a micropuncture sheath was placed over the wire. The inner dilator and wire were removed, and an 035 Bentson wire was advanced under fluoroscopy into the abdominal aorta. The sheath was removed and a standard 5 [place] vascular sheath was placed. The dilator was removed and the sheath was flushed. C2 catheter was advanced on the Bentson wire to the SMA origin. SMA angiogram was performed. Cobra catheter was then withdrawn to the left hypogastric artery and angiogram was performed. After review of the images, we elected to first approach the hypogastric artery as a potential for contribution to the endoleak. We advanced a rim catheter over the Bentson wire, with the tip projecting into the hypogastric artery origin. Angiogram was performed. We then used a combination of a Progreat alpha 130 cm and 150 cm microcatheter with a fathom wire and chikai wire to select the hypogastric artery branches directed cephalad. With various obliquity and various catheter position, multiple angiogram performed. A distal catheter position was never achieved in any of the posterior division branches that could confirm or exclude filling of the excluded aneurysm sac from the posterior division branches on the left. Ultimately after approximately  20 minutes of fluoro time we elected to approach the superior mesenteric artery. C2 catheter was advanced over the Bentson wire, removing the rim catheter. Bentson wire was advanced into the suprarenal abdominal aorta. Catheter was removed and we exchanged the 5 French short sheath for a 45 cm 6 French mesenteric sheath. C2 catheter was used to select the SMA origin. Angiogram was performed. 150 cm Progreat alpha catheter was then  advanced with the fathom wire into the mesenteric collateral, into the inferior mesenteric artery. Angiogram was performed confirming location. Injection at this level did confirm that the IMA was contributing to a large portion of the endoleak. Wire was removed. The Progreat catheter was just beyond the ostium of the IMA when we filled the dead space with DMSO. We then initiated onyx treatment with 18. Upon the first injection of on X we recognized that there were had been slight withdrawal of the catheter secondary to breathing motion, and there was reflux into the IMA and not antegrade into the aneurysm sac. We quickly removed the indwelling catheter, and exchange for an echelon 14 catheter 150 cm. This was navigated back to the IMA artery. Angiogram confirmed that the onyx had occluded the IMA origin, however, given that the onyx head incomplete cohesion, we were able to push through the origin into the sac. The endoleak was then treated with 18, with some transmural deposition at the conclusion of the case. Catheter was removed and final angiogram was performed. Angio-Seal was deployed for hemostasis. Patient tolerated the procedure well and remained hemodynamically stable throughout. No complications were encountered and no significant blood loss. IMPRESSION: Status post ultrasound-guided access left common femoral artery for left pelvic angiogram and mesenteric angiogram with trans arterial treatment of type 2 endoleak with liquid embolic (Onyx) embolization. Status post Angio-Seal  for hemostasis. Signed, Dulcy Fanny. Dellia Nims, RPVI Vascular and Interventional Radiology Specialists [facility] Radiology Electronically Signed   By: Corrie Mckusick D.O.   On: 06/12/2020 14:58   IR Angiogram Pelvis Selective Or Supraselective  Result Date: 06/12/2020 INDICATION: 74 year old male with a history of endovascular repair of infrarenal abdominal aortic aneurysm with a type 2 endoleak and enlarging sac EXAM: ULTRASOUND-GUIDED ACCESS LEFT COMMON FEMORAL ARTERY MESENTERIC ANGIOGRAM LEFT PELVIC ANGIOGRAM TRANS ARTERIAL EMBOLIZATION OF TYPE 2 ENDOLEAK ANGIO-SEAL FOR HEMOSTASIS MEDICATIONS: NONE ANESTHESIA/SEDATION: Moderate (conscious) sedation was employed during this procedure. A total of Versed 2.25 mg and Fentanyl 112.5 mcg was administered intravenously. Moderate Sedation Time: 146 minutes. The patient's level of consciousness and vital signs were monitored continuously by radiology nursing throughout the procedure under my direct supervision. CONTRAST:  59mL OMNIPAQUE IOHEXOL 300 MG/ML SOLN, 51mL OMNIPAQUE IOHEXOL 300 MG/ML SOLN FLUOROSCOPY TIME:  Fluoroscopy Time: 44 minutes 42 seconds (2,981 mGy). COMPLICATIONS: None PROCEDURE: Informed consent was obtained from the patient following explanation of the procedure, risks, benefits and alternatives. The patient understands, agrees and consents for the procedure. All questions were addressed. A time out was performed prior to the initiation of the procedure. Maximal barrier sterile technique utilized including caps, mask, sterile gowns, sterile gloves, large sterile drape, hand hygiene, and Betadine prep. Ultrasound survey of the left inguinal region was performed with images stored and sent to PACs, confirming patency of the vessel. 1% lidocaine was used for local anesthesia. Small stab incision was made with 11 blade scalpel. Blunt dissection was performed under ultrasound. A micropuncture needle was used access the left common femoral artery under  ultrasound. With excellent arterial blood flow returned, and an .018 micro wire was passed through the needle, observed enter the abdominal aorta under fluoroscopy. The needle was removed, and a micropuncture sheath was placed over the wire. The inner dilator and wire were removed, and an 035 Bentson wire was advanced under fluoroscopy into the abdominal aorta. The sheath was removed and a standard 5 [place] vascular sheath was placed. The dilator was removed and the sheath was flushed. C2 catheter was advanced  on the Bentson wire to the SMA origin. SMA angiogram was performed. Cobra catheter was then withdrawn to the left hypogastric artery and angiogram was performed. After review of the images, we elected to first approach the hypogastric artery as a potential for contribution to the endoleak. We advanced a rim catheter over the Bentson wire, with the tip projecting into the hypogastric artery origin. Angiogram was performed. We then used a combination of a Progreat alpha 130 cm and 150 cm microcatheter with a fathom wire and chikai wire to select the hypogastric artery branches directed cephalad. With various obliquity and various catheter position, multiple angiogram performed. A distal catheter position was never achieved in any of the posterior division branches that could confirm or exclude filling of the excluded aneurysm sac from the posterior division branches on the left. Ultimately after approximately 20 minutes of fluoro time we elected to approach the superior mesenteric artery. C2 catheter was advanced over the Bentson wire, removing the rim catheter. Bentson wire was advanced into the suprarenal abdominal aorta. Catheter was removed and we exchanged the 5 French short sheath for a 45 cm 6 French mesenteric sheath. C2 catheter was used to select the SMA origin. Angiogram was performed. 150 cm Progreat alpha catheter was then advanced with the fathom wire into the mesenteric collateral, into the  inferior mesenteric artery. Angiogram was performed confirming location. Injection at this level did confirm that the IMA was contributing to a large portion of the endoleak. Wire was removed. The Progreat catheter was just beyond the ostium of the IMA when we filled the dead space with DMSO. We then initiated onyx treatment with 18. Upon the first injection of on X we recognized that there were had been slight withdrawal of the catheter secondary to breathing motion, and there was reflux into the IMA and not antegrade into the aneurysm sac. We quickly removed the indwelling catheter, and exchange for an echelon 14 catheter 150 cm. This was navigated back to the IMA artery. Angiogram confirmed that the onyx had occluded the IMA origin, however, given that the onyx head incomplete cohesion, we were able to push through the origin into the sac. The endoleak was then treated with 18, with some transmural deposition at the conclusion of the case. Catheter was removed and final angiogram was performed. Angio-Seal was deployed for hemostasis. Patient tolerated the procedure well and remained hemodynamically stable throughout. No complications were encountered and no significant blood loss. IMPRESSION: Status post ultrasound-guided access left common femoral artery for left pelvic angiogram and mesenteric angiogram with trans arterial treatment of type 2 endoleak with liquid embolic (Onyx) embolization. Status post Angio-Seal for hemostasis. Signed, Dulcy Fanny. Dellia Nims, RPVI Vascular and Interventional Radiology Specialists [facility] Radiology Electronically Signed   By: Corrie Mckusick D.O.   On: 06/12/2020 14:58   IR Angiogram Selective Each Additional Vessel  Result Date: 06/12/2020 INDICATION: 74 year old male with a history of endovascular repair of infrarenal abdominal aortic aneurysm with a type 2 endoleak and enlarging sac EXAM: ULTRASOUND-GUIDED ACCESS LEFT COMMON FEMORAL ARTERY MESENTERIC ANGIOGRAM LEFT PELVIC  ANGIOGRAM TRANS ARTERIAL EMBOLIZATION OF TYPE 2 ENDOLEAK ANGIO-SEAL FOR HEMOSTASIS MEDICATIONS: NONE ANESTHESIA/SEDATION: Moderate (conscious) sedation was employed during this procedure. A total of Versed 2.25 mg and Fentanyl 112.5 mcg was administered intravenously. Moderate Sedation Time: 146 minutes. The patient's level of consciousness and vital signs were monitored continuously by radiology nursing throughout the procedure under my direct supervision. CONTRAST:  33mL OMNIPAQUE IOHEXOL 300 MG/ML SOLN, 32mL OMNIPAQUE IOHEXOL  300 MG/ML SOLN FLUOROSCOPY TIME:  Fluoroscopy Time: 44 minutes 42 seconds (2,981 mGy). COMPLICATIONS: None PROCEDURE: Informed consent was obtained from the patient following explanation of the procedure, risks, benefits and alternatives. The patient understands, agrees and consents for the procedure. All questions were addressed. A time out was performed prior to the initiation of the procedure. Maximal barrier sterile technique utilized including caps, mask, sterile gowns, sterile gloves, large sterile drape, hand hygiene, and Betadine prep. Ultrasound survey of the left inguinal region was performed with images stored and sent to PACs, confirming patency of the vessel. 1% lidocaine was used for local anesthesia. Small stab incision was made with 11 blade scalpel. Blunt dissection was performed under ultrasound. A micropuncture needle was used access the left common femoral artery under ultrasound. With excellent arterial blood flow returned, and an .018 micro wire was passed through the needle, observed enter the abdominal aorta under fluoroscopy. The needle was removed, and a micropuncture sheath was placed over the wire. The inner dilator and wire were removed, and an 035 Bentson wire was advanced under fluoroscopy into the abdominal aorta. The sheath was removed and a standard 5 [place] vascular sheath was placed. The dilator was removed and the sheath was flushed. C2 catheter was  advanced on the Bentson wire to the SMA origin. SMA angiogram was performed. Cobra catheter was then withdrawn to the left hypogastric artery and angiogram was performed. After review of the images, we elected to first approach the hypogastric artery as a potential for contribution to the endoleak. We advanced a rim catheter over the Bentson wire, with the tip projecting into the hypogastric artery origin. Angiogram was performed. We then used a combination of a Progreat alpha 130 cm and 150 cm microcatheter with a fathom wire and chikai wire to select the hypogastric artery branches directed cephalad. With various obliquity and various catheter position, multiple angiogram performed. A distal catheter position was never achieved in any of the posterior division branches that could confirm or exclude filling of the excluded aneurysm sac from the posterior division branches on the left. Ultimately after approximately 20 minutes of fluoro time we elected to approach the superior mesenteric artery. C2 catheter was advanced over the Bentson wire, removing the rim catheter. Bentson wire was advanced into the suprarenal abdominal aorta. Catheter was removed and we exchanged the 5 French short sheath for a 45 cm 6 French mesenteric sheath. C2 catheter was used to select the SMA origin. Angiogram was performed. 150 cm Progreat alpha catheter was then advanced with the fathom wire into the mesenteric collateral, into the inferior mesenteric artery. Angiogram was performed confirming location. Injection at this level did confirm that the IMA was contributing to a large portion of the endoleak. Wire was removed. The Progreat catheter was just beyond the ostium of the IMA when we filled the dead space with DMSO. We then initiated onyx treatment with 18. Upon the first injection of on X we recognized that there were had been slight withdrawal of the catheter secondary to breathing motion, and there was reflux into the IMA and  not antegrade into the aneurysm sac. We quickly removed the indwelling catheter, and exchange for an echelon 14 catheter 150 cm. This was navigated back to the IMA artery. Angiogram confirmed that the onyx had occluded the IMA origin, however, given that the onyx head incomplete cohesion, we were able to push through the origin into the sac. The endoleak was then treated with 18, with some transmural deposition  at the conclusion of the case. Catheter was removed and final angiogram was performed. Angio-Seal was deployed for hemostasis. Patient tolerated the procedure well and remained hemodynamically stable throughout. No complications were encountered and no significant blood loss. IMPRESSION: Status post ultrasound-guided access left common femoral artery for left pelvic angiogram and mesenteric angiogram with trans arterial treatment of type 2 endoleak with liquid embolic (Onyx) embolization. Status post Angio-Seal for hemostasis. Signed, Dulcy Fanny. Dellia Nims, RPVI Vascular and Interventional Radiology Specialists [facility] Radiology Electronically Signed   By: Corrie Mckusick D.O.   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 A total of Versed 2.25 mg and Fentanyl 112.5 mcg was administered intravenously. Moderate Sedation Time: 146 minutes. The patient's level of consciousness and vital signs were monitored continuously by radiology nursing throughout the procedure under my direct supervision. CONTRAST:  76mL OMNIPAQUE IOHEXOL 300 MG/ML  SOLN, 36mL OMNIPAQUE IOHEXOL 300 MG/ML SOLN FLUOROSCOPY TIME:  Fluoroscopy Time: 44 minutes 42 seconds (2,981 mGy). COMPLICATIONS: None PROCEDURE: Informed consent was obtained from the patient following explanation of the procedure, risks, benefits and alternatives. The patient understands, agrees and consents for the procedure. All questions were addressed. A time out was performed prior to the initiation of the procedure. Maximal barrier sterile technique utilized including caps, mask, sterile gowns, sterile gloves, large sterile drape, hand hygiene, and Betadine prep. Ultrasound survey of the left inguinal region was performed with images stored and sent to PACs, confirming patency of the vessel. 1% lidocaine was used for local anesthesia. Small stab incision was made with 11 blade scalpel. Blunt dissection was performed under ultrasound. A micropuncture needle was used access the left common femoral artery under ultrasound. With excellent arterial blood flow returned, and an .018 micro wire was passed through the needle, observed enter the abdominal aorta under fluoroscopy. The needle was removed, and a micropuncture sheath was placed over the wire. The inner dilator and wire were removed, and an 035 Bentson wire was advanced under fluoroscopy into the abdominal aorta. The sheath was removed and a standard 5 [place] vascular sheath was placed. The dilator was removed and the sheath was flushed. C2 catheter was advanced on the Bentson wire to the SMA origin. SMA angiogram was performed. Cobra catheter was then withdrawn to the left hypogastric artery and angiogram was performed. After review of the images, we elected to first approach the hypogastric artery as a potential for contribution to the endoleak. We advanced a rim catheter over the Bentson wire, with the tip projecting into the hypogastric artery origin. Angiogram was performed. We then used a combination of a Progreat alpha 130 cm and 150 cm  microcatheter with a fathom wire and chikai wire to select the hypogastric artery branches directed cephalad. With various obliquity and various catheter position, multiple angiogram performed. A distal catheter position was never achieved in any of the posterior division branches that could confirm or exclude filling of the excluded aneurysm sac from the posterior division branches on the left. Ultimately after approximately 20 minutes of fluoro time we elected to approach the superior mesenteric artery. C2 catheter was advanced over the Bentson wire, removing  the rim catheter. Bentson wire was advanced into the suprarenal abdominal aorta. Catheter was removed and we exchanged the 5 French short sheath for a 45 cm 6 French mesenteric sheath. C2 catheter was used to select the SMA origin. Angiogram was performed. 150 cm Progreat alpha catheter was then advanced with the fathom wire into the mesenteric collateral, into the inferior mesenteric artery. Angiogram was performed confirming location. Injection at this level did confirm that the IMA was contributing to a large portion of the endoleak. Wire was removed. The Progreat catheter was just beyond the ostium of the IMA when we filled the dead space with DMSO. We then initiated onyx treatment with 18. Upon the first injection of on X we recognized that there were had been slight withdrawal of the catheter secondary to breathing motion, and there was reflux into the IMA and not antegrade into the aneurysm sac. We quickly removed the indwelling catheter, and exchange for an echelon 14 catheter 150 cm. This was navigated back to the IMA artery. Angiogram confirmed that the onyx had occluded the IMA origin, however, given that the onyx head incomplete cohesion, we were able to push through the origin into the sac. The endoleak was then treated with 18, with some transmural deposition at the conclusion of the case. Catheter was removed and final angiogram was performed.  Angio-Seal was deployed for hemostasis. Patient tolerated the procedure well and remained hemodynamically stable throughout. No complications were encountered and no significant blood loss. IMPRESSION: Status post ultrasound-guided access left common femoral artery for left pelvic angiogram and mesenteric angiogram with trans arterial treatment of type 2 endoleak with liquid embolic (Onyx) embolization. Status post Angio-Seal for hemostasis. Signed, Dulcy Fanny. Dellia Nims, RPVI [facility] Inter[facility] Radiology Specialists [facility] Radiology Electronically Signed   By: Corrie Mckusick D.O.   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 A total of Versed 2.25 mg and Fentanyl 112.5 mcg was administered intravenously. Moderate Sedation Time: 146 minutes. The patient's level of consciousness and vital signs were monitored continuously by radiology nursing throughout the procedure under my direct supervision. CONTRAST:  8mL OMNIPAQUE IOHEXOL 300 MG/ML SOLN, 33mL OMNIPAQUE IOHEXOL 300 MG/ML SOLN FLUOROSCOPY TIME:  Fluoroscopy Time: 44 minutes 42 seconds (2,981 mGy). COMPLICATIONS: None PROCEDURE: Informed consent was obtained from the patient following explanation of the procedure, risks, benefits and alternatives. The patient understands, agrees and consents for the procedure. All questions were addressed. A time out was performed prior to the initiation of the procedure. Maximal barrier sterile technique utilized including caps, mask, sterile  gowns, sterile gloves, large sterile drape, hand hygiene, and Betadine prep. Ultrasound survey of the left inguinal region was performed with images stored and sent to PACs, confirming patency of the vessel. 1% lidocaine was used for local anesthesia. Small stab incision was made with 11 blade scalpel. Blunt dissection was performed under ultrasound. A micropuncture needle was used access the left common femoral artery under ultrasound. With excellent arterial blood flow returned, and an .018 micro wire was passed through the needle, observed enter the abdominal aorta under fluoroscopy. The needle was removed, and a micropuncture sheath was  placed over the wire. The inner dilator and wire were removed, and an 035 Bentson wire was advanced under fluoroscopy into the abdominal aorta. The sheath was removed and a standard 5 [place] vascular sheath was placed. The dilator was removed and the sheath was flushed. C2 catheter was advanced on the Bentson wire to the SMA origin. SMA angiogram was performed. Cobra catheter was then withdrawn to the left hypogastric artery and angiogram was performed. After review of the images, we elected to first approach the hypogastric artery as a potential for contribution to the endoleak. We advanced a rim catheter over the Bentson wire, with the tip projecting into the hypogastric artery origin. Angiogram was performed. We then used a combination of a Progreat alpha 130 cm and 150 cm microcatheter with a fathom wire and chikai wire to select the hypogastric artery branches directed cephalad. With various obliquity and various catheter position, multiple angiogram performed. A distal catheter position was never achieved in any of the posterior division branches that could confirm or exclude filling of the excluded aneurysm sac from the posterior division branches on the left. Ultimately after approximately 20 minutes of fluoro time we elected to approach the superior mesenteric artery. C2  catheter was advanced over the Bentson wire, removing the rim catheter. Bentson wire was advanced into the suprarenal abdominal aorta. Catheter was removed and we exchanged the 5 French short sheath for a 45 cm 6 French mesenteric sheath. C2 catheter was used to select the SMA origin. Angiogram was performed. 150 cm Progreat alpha catheter was then advanced with the fathom wire into the mesenteric collateral, into the inferior mesenteric artery. Angiogram was performed confirming location. Injection at this level did confirm that the IMA was contributing to a large portion of the endoleak. Wire was removed. The Progreat catheter was just beyond the ostium of the IMA when we filled the dead space with DMSO. We then initiated onyx treatment with 18. Upon the first injection of on X we recognized that there were had been slight withdrawal of the catheter secondary to breathing motion, and there was reflux into the IMA and not antegrade into the aneurysm sac. We quickly removed the indwelling catheter, and exchange for an echelon 14 catheter 150 cm. This was navigated back to the IMA artery. Angiogram confirmed that the onyx had occluded the IMA origin, however, given that the onyx head incomplete cohesion, we were able to push through the origin into the sac. The endoleak was then treated with 18, with some transmural deposition at the conclusion of the case. Catheter was removed and final angiogram was performed. Angio-Seal was deployed for hemostasis. Patient tolerated the procedure well and remained hemodynamically stable throughout. No complications were encountered and no significant blood loss. IMPRESSION: Status post ultrasound-guided access left common femoral artery for left pelvic angiogram and mesenteric angiogram with trans arterial treatment of type 2 endoleak with liquid embolic (Onyx) embolization. Status post Angio-Seal for hemostasis. Signed, Dulcy Fanny. Dellia Nims, RPVI Vascular and Interventional  Radiology Specialists [facility] Radiology Electronically Signed   By: Corrie Mckusick D.O.   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  A total of Versed 2.25 mg and Fentanyl 112.5 mcg was administered intravenously. Moderate Sedation Time: 146 minutes. The patient's level of consciousness and vital signs were monitored continuously by radiology nursing throughout the procedure under my direct supervision. CONTRAST:  33mL OMNIPAQUE IOHEXOL 300 MG/ML SOLN, 13mL OMNIPAQUE IOHEXOL 300 MG/ML SOLN FLUOROSCOPY TIME:  Fluoroscopy Time: 44 minutes 42 seconds (2,981 mGy). COMPLICATIONS: None PROCEDURE: Informed consent was obtained from the patient following explanation of the procedure, risks, benefits and alternatives. The patient understands, agrees and consents for the procedure. All questions were addressed. A time out was performed prior to the initiation of the procedure. Maximal barrier sterile technique utilized including caps, mask, sterile gowns, sterile gloves, large sterile drape, hand hygiene, and Betadine prep. Ultrasound survey of the left inguinal region was performed with images stored and sent to PACs, confirming patency of the vessel. 1% lidocaine was used for local anesthesia. Small stab incision was made with 11 blade scalpel. Blunt dissection was performed under ultrasound. A micropuncture needle was used access the left common femoral artery under ultrasound. With excellent arterial blood flow returned, and an .018 micro wire was passed through  the needle, observed enter the abdominal aorta under fluoroscopy. The needle was removed, and a micropuncture sheath was placed over the wire. The inner dilator and wire were removed, and an 035 Bentson wire was advanced under fluoroscopy into the abdominal aorta. The sheath was removed and a standard 5 [place] vascular sheath was placed. The dilator was removed and the sheath was flushed. C2 catheter was advanced on the Bentson wire to the SMA origin. SMA angiogram was performed. Cobra catheter was then withdrawn to the left hypogastric artery and angiogram was performed. After review of the images, we elected to first approach the hypogastric artery as a potential for contribution to the endoleak. We advanced a rim catheter over the Bentson wire, with the tip projecting into the hypogastric artery origin. Angiogram was performed. We then used a combination of a Progreat alpha 130 cm and 150 cm microcatheter with a fathom wire and chikai wire to select the hypogastric artery branches directed cephalad. With various obliquity and various catheter position, multiple angiogram performed. A distal catheter position was never achieved in any of the posterior division branches that could confirm or exclude filling of the excluded aneurysm sac from the posterior division branches on the left. Ultimately after approximately 20 minutes of fluoro time we elected to approach the superior mesenteric artery. C2 catheter was advanced over the Bentson wire, removing the rim catheter. Bentson wire was advanced into the suprarenal abdominal aorta. Catheter was removed and we exchanged the 5 French short sheath for a 45 cm 6 French mesenteric sheath. C2 catheter was used to select the SMA origin. Angiogram was performed. 150 cm Progreat alpha catheter was then advanced with the fathom wire into the mesenteric collateral, into the inferior mesenteric artery. Angiogram was performed confirming location. Injection at this level did  confirm that the IMA was contributing to a large portion of the endoleak. Wire was removed. The Progreat catheter was just beyond the ostium of the IMA when we filled the dead space with DMSO. We then initiated onyx treatment with 18. Upon the first injection of on X we recognized that there were had been slight withdrawal of the catheter secondary to breathing motion, and there was reflux into the IMA and not antegrade into the aneurysm sac. We quickly removed the indwelling catheter, and exchange for an echelon 14 catheter  150 cm. This was navigated back to the IMA artery. Angiogram confirmed that the onyx had occluded the IMA origin, however, given that the onyx head incomplete cohesion, we were able to push through the origin into the sac. The endoleak was then treated with 18, with some transmural deposition at the conclusion of the case. Catheter was removed and final angiogram was performed. Angio-Seal was deployed for hemostasis. Patient tolerated the procedure well and remained hemodynamically stable throughout. No complications were encountered and no significant blood loss. IMPRESSION: Status post ultrasound-guided access left common femoral artery for left pelvic angiogram and mesenteric angiogram with trans arterial treatment of type 2 endoleak with liquid embolic (Onyx) embolization. Status post Angio-Seal for hemostasis. Signed, Dulcy Fanny. Dellia Nims, RPVI Vascular and Interventional Radiology Specialists [facility] Radiology Electronically Signed   By: Corrie Mckusick D.O.   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 A total of Versed 2.25 mg and Fentanyl 112.5 mcg was administered intravenously. Moderate Sedation Time: 146 minutes. The patient's level of consciousness and vital signs were monitored continuously by radiology nursing throughout the procedure under my direct supervision. CONTRAST:  46mL OMNIPAQUE IOHEXOL 300 MG/ML SOLN, 19mL OMNIPAQUE IOHEXOL 300 MG/ML SOLN FLUOROSCOPY TIME:  Fluoroscopy Time: 44 minutes 42 seconds (2,981 mGy). COMPLICATIONS: None PROCEDURE: Informed consent was obtained from the patient following explanation of the procedure, risks, benefits and alternatives. The patient understands, agrees and consents for the procedure. All questions were addressed. A time out was performed prior to the initiation of the procedure. Maximal barrier sterile technique utilized including caps, mask, sterile gowns, sterile gloves, large sterile drape, hand hygiene, and Betadine prep. Ultrasound survey of the left inguinal region was performed with images stored and sent to PACs, confirming patency of the vessel. 1% lidocaine was used for local anesthesia. Small stab incision was made with 11 blade scalpel. Blunt dissection was performed under ultrasound. A micropuncture needle was used access the left common femoral artery under ultrasound. With excellent arterial blood flow returned, and an .018 micro wire was passed through the needle, observed enter the abdominal aorta under fluoroscopy. The needle was removed, and a micropuncture sheath was placed over the wire. The inner dilator and wire were removed, and an 035 Bentson wire was advanced under fluoroscopy into the abdominal aorta. The sheath was removed and a standard 5 [place] vascular sheath was placed. The dilator was removed and the sheath was flushed. C2 catheter was advanced on the Bentson wire to the SMA origin. SMA angiogram was performed. Cobra  catheter was then withdrawn to the left hypogastric artery and angiogram was performed. After review of the images, we elected to first approach the hypogastric artery as a potential for contribution to the endoleak. We advanced a rim catheter over the Bentson wire, with the tip projecting into the hypogastric artery origin. Angiogram was performed. We then used a combination of a Progreat alpha 130 cm and 150 cm microcatheter with a fathom wire and chikai wire to select the hypogastric artery branches directed cephalad. With various obliquity and various catheter position, multiple angiogram performed. A distal catheter position was  never achieved in any of the posterior division branches that could confirm or exclude filling of the excluded aneurysm sac from the posterior division branches on the left. Ultimately after approximately 20 minutes of fluoro time we elected to approach the superior mesenteric artery. C2 catheter was advanced over the Bentson wire, removing the rim catheter. Bentson wire was advanced into the suprarenal abdominal aorta. Catheter was removed and we exchanged the 5 French short sheath for a 45 cm 6 French mesenteric sheath. C2 catheter was used to select the SMA origin. Angiogram was performed. 150 cm Progreat alpha catheter was then advanced with the fathom wire into the mesenteric collateral, into the inferior mesenteric artery. Angiogram was performed confirming location. Injection at this level did confirm that the IMA was contributing to a large portion of the endoleak. Wire was removed. The Progreat catheter was just beyond the ostium of the IMA when we filled the dead space with DMSO. We then initiated onyx treatment with 18. Upon the first injection of on X we recognized that there were had been slight withdrawal of the catheter secondary to breathing motion, and there was reflux into the IMA and not antegrade into the aneurysm sac. We quickly removed the indwelling catheter, and  exchange for an echelon 14 catheter 150 cm. This was navigated back to the IMA artery. Angiogram confirmed that the onyx had occluded the IMA origin, however, given that the onyx head incomplete cohesion, we were able to push through the origin into the sac. The endoleak was then treated with 18, with some transmural deposition at the conclusion of the case. Catheter was removed and final angiogram was performed. Angio-Seal was deployed for hemostasis. Patient tolerated the procedure well and remained hemodynamically stable throughout. No complications were encountered and no significant blood loss. IMPRESSION: Status post ultrasound-guided access left common femoral artery for left pelvic angiogram and mesenteric angiogram with trans arterial treatment of type 2 endoleak with liquid embolic (Onyx) embolization. Status post Angio-Seal for hemostasis. Signed, Dulcy Fanny. Dellia Nims, RPVI Vascular and Interventional Radiology Specialists [facility] Radiology Electronically Signed   By: Corrie Mckusick D.O.   On: 06/12/2020 14:58    Labs:  CBC: Recent Labs    06/12/20 0744  WBC 10.9*  HGB 15.2  HCT 45.6  PLT 252    COAGS: Recent Labs    06/12/20 0744  INR 1.1    BMP: Recent Labs    05/22/20 0846  NA 142  K 4.4  CL 102  CO2 26  GLUCOSE 84  BUN 16  CALCIUM 9.8  CREATININE 1.21  GFRNONAA 58*  GFRAA 67    LIVER FUNCTION TESTS: No results for input(s): BILITOT, AST, ALT, ALKPHOS, PROT, ALBUMIN in the last 8760 hours.  TUMOR MARKERS: No results for input(s): AFPTM, CEA, CA199, CHROMGRNA in the last 8760 hours.  Assessment and Plan:  Mr Gladden is 74 yo male with recent trans-arterial embolization of type II endoleak, and he has done fine with recovery.   I discussed the need with him for surveillance at this point, likely with non-contrast CT study unless there was any problem identified.  We will plan on his first CT in 6 months after our completion date.  We also dicussed  continuing medical therapy.    I did emphasize that if he should have any new acute abdominal pain, syncope/fainting or other concerning symptoms he should go to the ED for evaluation.  He understands.   Plan: - Follow up visit in January (  6 months after embolization) with non-contrast abd/pelvis CT.   - Continue medical therapy     Electronically Signed: Corrie Mckusick 07/08/2020, 8:16 AM   I spent a total of    25 Minutes in remote  clinical consultation, greater than 50% of which was counseling/coordinating care for post-embolization of type II endoleak, transarterial.    Visit type: Audio only (telephone). Audio (no video) only due to patient's lack of internet/smartphone capability. Alternative for in-person consultation at [facility], [address] [place], [place], [place]. This visit type was conducted due to national recommendations for restrictions regarding the COVID-19 Pandemic (e.g. social distancing).  This format is felt to be most appropriate for this patient at this time.  All issues noted in this document were discussed and addressed.

## 2020-08-24 ENCOUNTER — Other Ambulatory Visit: Payer: Self-pay | Admitting: Cardiovascular Disease

## 2020-11-09 ENCOUNTER — Ambulatory Visit: Payer: Medicare Other | Admitting: Cardiovascular Disease

## 2020-11-10 ENCOUNTER — Other Ambulatory Visit: Payer: Self-pay | Admitting: Cardiovascular Disease

## 2020-11-11 NOTE — Telephone Encounter (Signed)
Rx has been sent to the pharmacy electronically. ° °

## 2020-11-30 ENCOUNTER — Ambulatory Visit: Payer: Medicare Other | Admitting: Surgery

## 2020-12-07 ENCOUNTER — Other Ambulatory Visit: Payer: Self-pay | Admitting: Interventional Radiology

## 2020-12-07 DIAGNOSIS — I9789 Other postprocedural complications and disorders of the circulatory system, not elsewhere classified: Secondary | ICD-10-CM

## 2021-01-11 ENCOUNTER — Other Ambulatory Visit: Payer: Self-pay

## 2021-01-11 ENCOUNTER — Encounter: Payer: Self-pay | Admitting: Surgery

## 2021-01-11 ENCOUNTER — Ambulatory Visit (INDEPENDENT_AMBULATORY_CARE_PROVIDER_SITE_OTHER): Payer: Medicare Other | Admitting: Surgery

## 2021-01-11 VITALS — BP 136/83 | HR 55 | Temp 98.1°F | Resp 20 | Ht 71.0 in | Wt 202.0 lb

## 2021-01-11 DIAGNOSIS — I714 Abdominal aortic aneurysm, without rupture, unspecified: Secondary | ICD-10-CM

## 2021-01-11 NOTE — Progress Notes (Signed)
[facility] [place]  Patient name: Kenneth Scott MRN: [id number] DOB: 1945/12/30 Sex: male   REASON FOR VISIT:    Follow up  HISOTRY OF PRESENT ILLNESS:   Kenneth Scott a 75 y.o.malewho is status post endovascular repair of a 5.2cm abdominal aortic aneurysm on 01/02/2019. He has had no complaints since his operation.  He denies any abdominal pain.  His AAA had increased in size with a type 2 endoleak and so I sent him to IR for evaluation. On 06-12-2020, he underwent embolization by Dr. Earleen Newport  Patient has a history of myocardial infarction and PCI of his LAD in 2000. 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 He has had a valve replacement. His history of DVT and PE in 2010. He is on chronic anticoagulation. He takes a statin for hypercholesterolemia. He is a non-smoker. He is medically managed for hypertension.   PAST MEDICAL HISTORY:   Past Medical History:  Diagnosis Date  . AAA (abdominal aortic aneurysm) ([place]) 03/04/13   aortic duplex dopler- when compared tp previous study ther is no change. aneurysmal dilation measuring 3.5 x 3.4 cemtimeters  . Anxiety   . Chronic anticoagulation   . Coronary artery disease 04/23/13   NUC STRESS TEST-EF 52% High risk scan two separate perfusion defects and a suggestion of reveribility in the inferior-inferolateral wall.  . Gout   . History of DVT (deep vein thrombosis)   . History of kidney stones   . Hyperlipidemia   . Hypertension 09/06/12   ECHO-EF 45%   . Metatarsalgia of both feet   . Pancreatic cyst   . Pancreatic lesion   . PE (pulmonary embolism)   . PVD (peripheral vascular disease) ([place])   . Sleep apnea    No cpap  . TIA (transient ischemic attack)      FAMILY HISTORY:   Family History  Problem Relation Age of Onset  . Heart disease Mother   . Heart disease Father   . Cancer Sister   . Heart disease Sister   . Heart disease Brother     SOCIAL  HISTORY:   Social History   Tobacco Use  . Smoking status: Never Smoker  . Smokeless tobacco: Never Used  Substance Use Topics  . Alcohol use: No     ALLERGIES:   No Known Allergies   CURRENT MEDICATIONS:   Current Outpatient Medications  Medication Sig Dispense Refill  . allopurinol (ZYLOPRIM) 300 MG tablet Take 300 mg by mouth daily.   4  . aspirin 81 MG tablet Take 81 mg by mouth daily.    . Coenzyme Q10 (CO Q-10) 200 MG CAPS Take 200 mg by mouth daily.    . colchicine 0.6 MG tablet Take 0.6 mg by mouth daily as needed (gout).     Marland Kitchen escitalopram (LEXAPRO) 10 MG tablet Take 5 mg by mouth daily.     . furosemide (LASIX) 40 MG tablet Take 20 mg by mouth daily as needed for fluid.     Marland Kitchen irbesartan-hydrochlorothiazide (AVALIDE) 150-12.5 MG per tablet Take 0.5 tablets by mouth daily.     . metoprolol succinate (TOPROL-XL) 25 MG 24 hr tablet TAKE 1/2 TABLET BY MOUTH EVERY DAY 45 tablet 1  . nitroGLYCERIN (NITROSTAT) 0.4 MG SL tablet PLACE 1 TABLET (0.4 MG TOTAL) UNDER THE TONGUE EVERY 5 (FIVE) MINUTES AS NEEDED FOR CHEST PAIN. (Patient taking differently: Place 0.4 mg under the tongue every 5 (five) minutes as needed for chest pain.  PLACE 1 TABLET (0.4 MG TOTAL) UNDER THE TONGUE EVERY 5 (FIVE) MINUTES AS NEEDED FOR CHEST PAIN.) 25 tablet 6  . pravastatin (PRAVACHOL) 20 MG tablet Take 20 mg by mouth daily.   4  . predniSONE (DELTASONE) 10 MG tablet Take 10 mg by mouth as needed (gout flare up).     . warfarin (COUMADIN) 5 MG tablet Take 5 mg by mouth daily.     Marland Kitchen ezetimibe (ZETIA) 10 MG tablet Take 1 tablet (10 mg total) by mouth daily. 90 tablet 3   No current facility-administered medications for this visit.    REVIEW OF SYSTEMS:   [X]  denotes positive finding, [ ]  denotes negative finding Cardiac  Comments:  Chest pain or chest pressure:    Shortness of breath upon exertion:    Short of breath when lying flat:    Irregular heart rhythm:        Vascular    Pain in calf,  thigh, or hip brought on by ambulation:    Pain in feet at night that wakes you up from your sleep:     Blood clot in your veins:    Leg swelling:         Pulmonary    Oxygen at home:    Productive cough:     Wheezing:         Neurologic    Sudden weakness in arms or legs:     Sudden numbness in arms or legs:     Sudden onset of difficulty speaking or slurred speech:    Temporary loss of vision in one eye:     Problems with dizziness:         Gastrointestinal    Blood in stool:     Vomited blood:         Genitourinary    Burning when urinating:     Blood in urine:        Psychiatric    Major depression:         Hematologic    Bleeding problems:    Problems with blood clotting too easily:        Skin    Rashes or ulcers:        Constitutional    Fever or chills:      PHYSICAL EXAM:   Vitals:   01/11/21 0851  BP: 136/83  Pulse: (!) 55  Resp: 20  Temp: 98.1 F (36.7 C)  SpO2: 95%  Weight: 202 lb (91.6 kg)  Height: 5\' 11"  (1.803 m)    GENERAL: The patient is a well-nourished male, in no acute distress. The vital signs are documented above. CARDIAC: There is a regular rate and rhythm.  VASCULAR: Nonpalpable pedal pulses PULMONARY: Non-labored respirations ABDOMEN: Soft and non-tender with normal pitched bowel sounds.  MUSCULOSKELETAL: There are no major deformities or cyanosis. NEUROLOGIC: No focal weakness or paresthesias are detected. SKIN: There are no ulcers or rashes noted. PSYCHIATRIC: The patient has a normal affect.  STUDIES:   None  MEDICAL ISSUES:   Status post endovascular aneurysm repair with known type II endoleak that was embolized in July 2021.  Unfortunately, he has not had any additional imaging since his embolization.  Therefore I am ordering a CT scan and we will schedule him for virtual visit to go over the results.    Leia Alf, MD, FACS [facility] [phone] Pager [phone]

## 2021-01-22 ENCOUNTER — Ambulatory Visit
Admission: RE | Admit: 2021-01-22 | Discharge: 2021-01-22 | Disposition: A | Discharge status: Home / Self Care | Payer: Medicare Other | Source: Ambulatory Visit | Attending: Surgery | Admitting: Surgery

## 2021-01-22 ENCOUNTER — Other Ambulatory Visit: Payer: Self-pay

## 2021-01-22 DIAGNOSIS — I714 Abdominal aortic aneurysm, without rupture, unspecified: Secondary | ICD-10-CM

## 2021-01-22 MED ORDER — IOPAMIDOL (ISOVUE-370) INJECTION 76%
75.0000 mL | Freq: Once | INTRAVENOUS | Status: AC | PRN
  Administered 2021-01-22: 75 mL via INTRAVENOUS

## 2021-01-25 ENCOUNTER — Ambulatory Visit (INDEPENDENT_AMBULATORY_CARE_PROVIDER_SITE_OTHER): Payer: Medicare Other | Admitting: Surgery

## 2021-01-25 DIAGNOSIS — I714 Abdominal aortic aneurysm, without rupture, unspecified: Secondary | ICD-10-CM

## 2021-01-25 NOTE — Progress Notes (Signed)
[facility] [place]  Patient name: Kenneth Scott MRN: [id number] DOB: 08-Jun-1946 Sex: male      Virtual Visit via Telephone Note   This visit type was conducted due to national recommendations for restrictions regarding the COVID-19 Pandemic (e.g. social distancing) in an effort to limit this patient's exposure and mitigate transmission in our community.  Due to his co-morbid illnesses, this patient is at least at moderate risk for complications without adequate follow up.  This format is felt to be most appropriate for this patient at this time.  The patient did not have access to video technology/had technical difficulties with video requiring transitioning to audio format only (telephone).  All issues noted in this document were discussed and addressed.  No physical exam could be performed with this format.  Patient Location: Home Provider Location: Office/Clinic    REASON FOR APPOINTMENT:    Follow  up  HISTORY OF PRESENT ILLNESS:    Kenneth Kullman [name] a 75Y.o.malewho is status post endovascular repair of a 5.2cm abdominal aortic aneurysm on 01/02/2019. He has had no complaints since his operation.He denies any abdominal pain.  His AAA had increased in size with a type 2 endoleak and so I sent him to IR for evaluation. On 06-12-2020, he underwent embolization by Dr. Earleen Newport.  At our last visit, we did not have follow-up imaging and so I sent him for CT scan.  Patient has a history of myocardial infarction and PCI of his LAD in 2000. 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 He has had a valve replacement. His history of DVT and PE in 2010. He is on chronic anticoagulation. He takes a statin for hypercholesterolemia. He is a non-smoker. He is medically managed for hypertension.     PAST MEDICAL HISTORY    Past Medical History:  Diagnosis Date  . AAA (abdominal aortic aneurysm) ([place]) 03/04/13   aortic duplex dopler- when compared tp  previous study ther is no change. aneurysmal dilation measuring 3.5 x 3.4 cemtimeters  . Anxiety   . Chronic anticoagulation   . Coronary artery disease 04/23/13   NUC STRESS TEST-EF 52% High risk scan two separate perfusion defects and a suggestion of reveribility in the inferior-inferolateral wall.  . Gout   . History of DVT (deep vein thrombosis)   . History of kidney stones   . Hyperlipidemia   . Hypertension 09/06/12   ECHO-EF 45%   . Metatarsalgia of both feet   . Pancreatic cyst   . Pancreatic lesion   . PE (pulmonary embolism)   . PVD (peripheral vascular disease) ([place])   . Sleep apnea    No cpap  . TIA (transient ischemic attack)      FAMILY HISTORY   Family History  Problem Relation Age of Onset  . Heart disease Mother   . Heart disease Father   . Cancer Sister   . Heart disease Sister   . Heart disease Brother     SOCIAL HISTORY:   Social History   Socioeconomic History  . Marital status: Divorced    Spouse name: Not on file  . Number of children: 2  . Years of education: Not on file  . Highest education level: Not on file  Occupational History  . Occupation: self employed logger  Tobacco Use  . Smoking status: Never Smoker  . Smokeless tobacco: Never Used  Vaping Use  . Vaping Use:  Never used  Substance and Sexual Activity  . Alcohol use: No  . Drug use: No  . Sexual activity: Not on file  Other Topics Concern  . Not on file  Social History Narrative  . Not on file   Social Determinants of Health   Financial Resource Strain: Not on file  Food Insecurity: Not on file  Transportation Needs: Not on file  Physical Activity: Not on file  Stress: Not on file  Social Connections: Not on file  Intimate Partner Violence: Not on file    ALLERGIES:    No Known Allergies  CURRENT MEDICATIONS:    Current Outpatient Medications  Medication Sig Dispense Refill  . allopurinol (ZYLOPRIM) 300 MG tablet Take 300 mg by mouth daily.   4  .  aspirin 81 MG tablet Take 81 mg by mouth daily.    . Coenzyme Q10 (CO Q-10) 200 MG CAPS Take 200 mg by mouth daily.    . colchicine 0.6 MG tablet Take 0.6 mg by mouth daily as needed (gout).     Marland Kitchen escitalopram (LEXAPRO) 10 MG tablet Take 5 mg by mouth daily.     Marland Kitchen ezetimibe (ZETIA) 10 MG tablet Take 1 tablet (10 mg total) by mouth daily. 90 tablet 3  . furosemide (LASIX) 40 MG tablet Take 20 mg by mouth daily as needed for fluid.     Marland Kitchen irbesartan-hydrochlorothiazide (AVALIDE) 150-12.5 MG per tablet Take 0.5 tablets by mouth daily.     . metoprolol succinate (TOPROL-XL) 25 MG 24 hr tablet TAKE 1/2 TABLET BY MOUTH EVERY DAY 45 tablet 1  . nitroGLYCERIN (NITROSTAT) 0.4 MG SL tablet PLACE 1 TABLET (0.4 MG TOTAL) UNDER THE TONGUE EVERY 5 (FIVE) MINUTES AS NEEDED FOR CHEST PAIN. (Patient taking differently: Place 0.4 mg under the tongue every 5 (five) minutes as needed for chest pain. PLACE 1 TABLET (0.4 MG TOTAL) UNDER THE TONGUE EVERY 5 (FIVE) MINUTES AS NEEDED FOR CHEST PAIN.) 25 tablet 6  . pravastatin (PRAVACHOL) 20 MG tablet Take 20 mg by mouth daily.   4  . predniSONE (DELTASONE) 10 MG tablet Take 10 mg by mouth as needed (gout flare up).     . warfarin (COUMADIN) 5 MG tablet Take 5 mg by mouth daily.      No current facility-administered medications for this visit.    REVIEW OF SYSTEMS:   Please see the history of present illness.       PHYSICAL EXAM:   Not performed given virtual nature of visit  Recent Labs: 05/22/2020: BUN 16; Creatinine, Ser 1.21; Potassium 4.4; Sodium 142 06/12/2020: Hemoglobin 15.2; Platelets 252   Recent Lipid Panel Lab Results  Component Value Date/Time   CHOL 146 05/22/2020 08:46 AM   TRIG 201 (H) 05/22/2020 08:46 AM   HDL 29 (L) 05/22/2020 08:46 AM   CHOLHDL 5.0 05/22/2020 08:46 AM   CHOLHDL 4.6 07/29/2016 08:45 AM   LDLCALC 83 05/22/2020 08:46 AM    Wt Readings from Last 3 Encounters:  01/11/21 91.6 kg  06/12/20 96.2 kg  05/22/20 96 kg      STUDIES:   I have reviewed the following: 1. Patent bifurcated infrarenal stent graft with no definite residual endoleak, slight decrease in size of 6.6 cm native aneurysm sac diameter. 2. 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 3. Cholelithiasis. 4. Descending and sigmoid diverticulosis. 5. Small hiatal hernia. 6. Stable right lower pole renal mass, and 5.4 cm complex cystic lesion from left upper pole, previously 5.2  cm. Recommend urologic consultation, if not already obtained.  ASSESSMENT and PLAN   AAA: The endoleak appears to have resolved by CT scan.  Maximum diameter of the aneurysm was 6.6 cm which is slightly smaller.  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  I have him scheduled for follow-up in 6 months with an ultrasound.  Renal mass: This is followed by Dr. Tresa Moore with urology       Leia Alf, MD, FACS [facility] [phone] Pager [phone]

## 2021-01-27 ENCOUNTER — Other Ambulatory Visit: Payer: Self-pay

## 2021-01-27 DIAGNOSIS — I714 Abdominal aortic aneurysm, without rupture, unspecified: Secondary | ICD-10-CM

## 2021-03-30 ENCOUNTER — Other Ambulatory Visit: Payer: Self-pay

## 2021-03-30 ENCOUNTER — Ambulatory Visit
Admission: RE | Admit: 2021-03-30 | Discharge: 2021-03-30 | Disposition: A | Discharge status: Home / Self Care | Payer: Medicare Other | Source: Ambulatory Visit | Attending: Interventional Radiology | Admitting: Interventional Radiology

## 2021-03-30 DIAGNOSIS — I9789 Other postprocedural complications and disorders of the circulatory system, not elsewhere classified: Secondary | ICD-10-CM

## 2021-07-14 ENCOUNTER — Ambulatory Visit (HOSPITAL_COMMUNITY): Admission: RE | Admit: 2021-07-14 | Payer: Medicare Other | Source: Ambulatory Visit

## 2021-07-22 ENCOUNTER — Telehealth: Payer: Medicare Other

## 2021-08-13 ENCOUNTER — Ambulatory Visit (HOSPITAL_COMMUNITY)
Admission: RE | Admit: 2021-08-13 | Discharge: 2021-08-13 | Disposition: A | Discharge status: Home / Self Care | Payer: Medicare Other | Source: Ambulatory Visit | Attending: Interventional Radiology | Admitting: Interventional Radiology

## 2021-08-13 ENCOUNTER — Other Ambulatory Visit: Payer: Self-pay

## 2021-08-13 DIAGNOSIS — I723 Aneurysm of iliac artery: Secondary | ICD-10-CM | POA: Diagnosis not present

## 2021-08-13 DIAGNOSIS — K8020 Calculus of gallbladder without cholecystitis without obstruction: Secondary | ICD-10-CM | POA: Insufficient documentation

## 2021-08-13 DIAGNOSIS — I9789 Other postprocedural complications and disorders of the circulatory system, not elsewhere classified: Secondary | ICD-10-CM | POA: Diagnosis present

## 2021-08-13 DIAGNOSIS — K5730 Diverticulosis of large intestine without perforation or abscess without bleeding: Secondary | ICD-10-CM | POA: Insufficient documentation

## 2021-08-13 DIAGNOSIS — N200 Calculus of kidney: Secondary | ICD-10-CM | POA: Insufficient documentation

## 2021-08-17 ENCOUNTER — Ambulatory Visit
Admission: RE | Admit: 2021-08-17 | Discharge: 2021-08-17 | Disposition: A | Discharge status: Home / Self Care | Payer: Medicare Other | Source: Ambulatory Visit | Attending: Interventional Radiology | Admitting: Interventional Radiology

## 2021-08-17 ENCOUNTER — Other Ambulatory Visit: Payer: Self-pay

## 2021-08-17 ENCOUNTER — Encounter: Payer: Self-pay | Admitting: *Deleted

## 2021-08-17 HISTORY — PX: IR RADIOLOGIST EVAL & MGMT: IMG5224

## 2021-08-17 NOTE — Progress Notes (Signed)
Chief Complaint: Follow up after Endoleak Repair    Referring Physician(s): Brabham,Vance W   History of Present Illness: Kenneth Scott is a 75 y.o. male presenting as a scheduled follow up to [facility] today, for his post-operative visit after transarterial embolization of type II endoleak.   Before today we have had some trouble with follow through with his office appointments, but today he joins [place] by virtual visit.  His wife is on  the call with him.  We confirmed his identity with 2 identifiers.   Hx:  We treated him 06/12/2020 on out-patient basis at [facility], with left CFA approach for transarterial embolization.  The successful pathway was via the SMA --> IMA, and we embolized the sac with Onyx liquid embolic.     Interval Hx:  We last saw Kenneth Scott 07/08/20.  Since then, he tells me that he has had no trouble with new abdominal pain.  He denies any new symptoms, including no new symptoms of claudication. He continues to work daily, with full activities.   CT imaging before the endoleak repair showed greatest diameter of 6.1cm (03/23/20), and then 6.0cm (01/22/21), with most recent CT on 9/16 showing decrease to 5.5cm. No inflammatory changes.   Incidentally, Kenneth Scott's CT's have shows growth of renal lesions over time, both on the right and left, and most suspicious at the right inferior kidney.  These cannot be characterized on the current imaging, but are suspicious and I think deserve Urology follow up.     Past Medical History:  Diagnosis Date   AAA (abdominal aortic aneurysm) ([place]) 03/04/13   aortic duplex dopler- when compared tp previous study ther is no change. aneurysmal dilation measuring 3.5 x 3.4 cemtimeters   Anxiety    Chronic anticoagulation    Coronary artery disease 04/23/13   [facility] STRESS TEST-EF 52% High risk scan two separate perfusion defects and a suggestion of reveribility in the inferior-inferolateral wall.   Gout    History of DVT (deep vein  thrombosis)    History of kidney stones    Hyperlipidemia    Hypertension 09/06/12   ECHO-EF 45%    Metatarsalgia of both feet    Pancreatic cyst    Pancreatic lesion    PE (pulmonary embolism)    PVD (peripheral vascular disease) (HCC)    Sleep apnea    No cpap   TIA (transient ischemic attack)     Past Surgical History:  Procedure Laterality Date   ABDOMINAL AORTIC ENDOVASCULAR STENT GRAFT  01/02/2019   ABDOMINAL AORTIC ENDOVASCULAR STENT GRAFT N/A 01/02/2019   Procedure: ABDOMINAL AORTIC ENDOVASCULAR STENT GRAFT;  Surgeon: Serafina Mitchell, MD;  Location: [facility] OR;  Service: Vascular;  Laterality: N/A;   AORTIC VALVE REPLACEMENT     Using a 25 mm homograft with reimplanation of his coronary arteries   BACK SURGERY     CORONARY ARTERY BYPASS GRAFT  2005   By Dr Cyndia Bent, He had a LIMA placed to the LAD, a vien to the diagonal, a vein to the marginal, a vein to the 2nd diagonal, a vein  to the PDA, PLA vessel.   IR ANGIOGRAM PELVIS SELECTIVE OR SUPRASELECTIVE  06/12/2020   IR ANGIOGRAM SELECTIVE EACH ADDITIONAL VESSEL  06/12/2020   IR ANGIOGRAM SELECTIVE EACH ADDITIONAL VESSEL  06/12/2020   IR ANGIOGRAM SELECTIVE EACH ADDITIONAL VESSEL  06/12/2020   IR ANGIOGRAM VISCERAL SELECTIVE  06/12/2020   IR EMBO ARTERIAL NOT HEMORR HEMANG INC GUIDE ROADMAPPING  06/12/2020  IR RADIOLOGIST EVAL & MGMT  04/22/2020   IR RADIOLOGIST EVAL & MGMT  07/08/2020   IR US GUIDE VASC ACCESS LEFT  06/12/2020    Allergies: Patient has no known allergies.  Medications: Prior to Admission medications   Medication Sig Start Date End Date Taking? Authorizing Provider  allopurinol (ZYLOPRIM) 300 MG tablet Take 300 mg by mouth daily.  07/25/16   [provider]  aspirin 81 MG tablet Take 81 mg by mouth daily.    [provider]  Coenzyme Q10 (CO Q-10) 200 MG CAPS Take 200 mg by mouth daily.    [provider]  colchicine 0.6 MG tablet Take 0.6 mg by mouth daily as needed (gout).      [provider]  escitalopram (LEXAPRO) 10 MG tablet Take 5 mg by mouth daily.     [provider]  ezetimibe (ZETIA) 10 MG tablet Take 1 tablet (10 mg total) by mouth daily. 06/16/20 09/14/20  Deberah Pelton, NP  furosemide (LASIX) 40 MG tablet Take 20 mg by mouth daily as needed for fluid.     [provider]  irbesartan-hydrochlorothiazide (AVALIDE) 150-12.5 MG per tablet Take 0.5 tablets by mouth daily.     [provider]  metoprolol succinate (TOPROL-XL) 25 MG 24 hr tablet TAKE 1/2 TABLET BY MOUTH EVERY DAY 11/11/20   Troy Sine, MD  nitroGLYCERIN (NITROSTAT) 0.4 MG SL tablet PLACE 1 TABLET (0.4 MG TOTAL) UNDER THE TONGUE EVERY 5 (FIVE) MINUTES AS NEEDED FOR CHEST PAIN. Patient taking differently: Place 0.4 mg under the tongue every 5 (five) minutes as needed for chest pain. PLACE 1 TABLET (0.4 MG TOTAL) UNDER THE TONGUE EVERY 5 (FIVE) MINUTES AS NEEDED FOR CHEST PAIN. 10/17/17   Troy Sine, MD  pravastatin (PRAVACHOL) 20 MG tablet Take 20 mg by mouth daily.  07/25/16   [provider]  predniSONE (DELTASONE) 10 MG tablet Take 10 mg by mouth as needed (gout flare up).  03/31/13   [provider]  warfarin (COUMADIN) 5 MG tablet Take 5 mg by mouth daily.     [provider]     Family History  Problem Relation Age of Onset   Heart disease Mother    Heart disease Father    Cancer Sister    Heart disease Sister    Heart disease Brother     Social History   Socioeconomic History   Marital status: Divorced    Spouse name: Not on file   Number of children: 2   Years of education: Not on file   Highest education level: Not on file  Occupational History   Occupation: self employed logger  Tobacco Use   Smoking status: Never   Smokeless tobacco: Never  Vaping Use   Vaping Use: Never used  Substance and Sexual Activity   Alcohol use: No   Drug use: No   Sexual activity: Not on file  Other Topics Concern   Not  on file  Social History Narrative   Not on file   Social Determinants of Health   Financial Resource Strain: Not on file  Food Insecurity: Not on file  Transportation Needs: Not on file  Physical Activity: Not on file  Stress: Not on file  Social Connections: Not on file       Review of Systems  Review of Systems: A 12 point ROS discussed and pertinent positives are indicated in the HPI above.  All other systems are negative.  Physical Exam No direct physical exam was performed (except for noted visual exam findings with Video Visits).    Vital Signs: There were no vitals taken for this visit.  Imaging: CT ABDOMEN PELVIS WO CONTRAST  Result Date: 08/14/2021 CLINICAL DATA:  Follow-up abdominal aortic aneurysm stent graft endoleak. EXAM: CT ABDOMEN AND PELVIS WITHOUT CONTRAST TECHNIQUE: Multidetector CT imaging of the abdomen and pelvis was performed following the standard protocol without IV contrast. COMPARISON:  01/22/2021 FINDINGS: Lower chest: No acute findings. Hepatobiliary: No mass visualized on this unenhanced exam. Small fluid attenuation cyst in left hepatic lobe remains stable. Tiny less than 5 mm gallstones are again seen, however there is no evidence of cholecystitis or biliary ductal dilatation. Pancreas: No mass or inflammatory process visualized on this unenhanced exam. Spleen:  Within normal limits in size. Adrenals/Urinary tract: A few tiny 1-2 mm right renal calculi are again seen, however there is no evidence of ureteral calculi or hydronephrosis. Several complex cystic lesions are again seen in both kidneys which cannot be characterized without IV contrast, but show no significant change compared to prior exam. One complex lesion in the lower pole of the right kidney has increased in size, currently measuring 4.6 x 4.2 cm on image 38/2 compared to 2.5 x 2.1 cm previously. This cannot be characterized without IV contrast, and could represent a complex cyst or mass.  Unremarkable unopacified urinary bladder. Stomach/Bowel: No evidence of obstruction, inflammatory process, or abnormal fluid collections. Normal appendix visualized. Diverticulosis is seen mainly involving the descending and sigmoid colon, however there is no evidence of diverticulitis. Vascular/Lymphatic: No pathologically enlarged lymph nodes identified. Aorto bi-iliac stent graft remains in place. Significant artifact is seen from radiodense substance used for previous endoleak repair. While all endoleaks cannot be excluded on this noncontrast exam, there is no evidence of retroperitoneal hemorrhage. The native aneurysm sac currently measures 5.6 x 5.5 cm, further decreased in size from 6.6 x 5.9 cm on prior exam. A right internal iliac artery aneurysm is again seen just beyond the right iliac limb of the stent graft. This measures 2.9 x 3.1 cm, without significant change in size compared to previous study. Reproductive:  No mass or other significant abnormality. Other:  Stable small left inguinal hernia which contains only fat. Musculoskeletal:  No suspicious bone lesions identified. IMPRESSION: Aorto bi-iliac stent graft remains in place. Further decrease in size of native aneurysm sac since prior exam. While all endoleaks cannot be excluded on this noncontrast exam, there is no evidence of paraaortic/retroperitoneal hemorrhage. 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 Increased size of 4.6 cm complex lesion in lower pole of right kidney, which cannot be characterized without IV contrast, and could represent a complex cyst or mass. Renal protocol abdomen CT without and with contrast is recommended for further characterization. Colonic diverticulosis. No radiographic evidence of diverticulitis. Cholelithiasis. No radiographic evidence of cholecystitis. Tiny nonobstructing right renal calculi. No evidence of ureteral calculi or hydronephrosis. Electronically Signed   By: Marlaine Hind M.D.   On:  08/14/2021 10:34    Labs:  CBC: No results for input(s): WBC, HGB, HCT, PLT in the last 8760 hours.  COAGS: No results for input(s): INR, APTT in the last 8760 hours.  BMP: No results for input(s): NA, K, CL, CO2, GLUCOSE, BUN, CALCIUM, CREATININE, GFRNONAA, GFRAA in the last 8760 hours.  Invalid input(s): CMP  LIVER FUNCTION TESTS: No results for input(s): BILITOT, AST, ALT, ALKPHOS, PROT, ALBUMIN in the last 8760 hours.  TUMOR MARKERS: No results for input(s): AFPTM, CEA, CA199, CHROMGRNA in the last 8760 hours.  Assessment and Plan:  Kenneth Scott is 75 yo male with previous trans-arterial embolization of type II endoleak.  His CT imaging shows decreasing size of the aneurysm sac, and he remains asymptomatic.    I reinforced the need with him for surveillance at this point, likely with non-contrast CT study unless there was any problem identified.  We will plan on the next CT in 12 months.  We also dicussed continuing medical therapy.    Additionally, I did let him know that I would be referring him back to his Urologist for follow up, in particular regarding the right renal lesion that is suspicious.  I confirmed that he is established with [facility], today with the urology office.    I did emphasize that if he should have any new acute abdominal pain, syncope/fainting or other concerning symptoms he should go to the ED for evaluation.  He understands.    Plan: - Follow up visit in 12 months with non-contrast abd/pelvis CT.   - We will refer him back to his Urologist for follow up of the renal lesions.  The most suspicious lesion is the right inferior lesion.  - Continue medical therapy     Electronically Signed: Corrie Mckusick 08/17/2021, 8:49 AM   I spent a total of    25 Minutes in remote  clinical consultation, greater than 50% of which was counseling/coordinating care for type II endoleak, SP embolization.    Visit type: Audio only (telephone). Audio (no  video) only due to patient's lack of internet/smartphone capability. Alternative for in-person consultation at [facility], [address], [place], [place]. This visit type was conducted due to national recommendations for restrictions regarding the COVID-19 Pandemic (e.g. social distancing).  This format is felt to be most appropriate for this patient at this time.  All issues noted in this document were discussed and addressed.

## 2021-08-30 ENCOUNTER — Inpatient Hospital Stay (HOSPITAL_COMMUNITY): Admission: RE | Admit: 2021-08-30 | Payer: Medicare Other | Source: Ambulatory Visit

## 2021-08-30 ENCOUNTER — Ambulatory Visit: Payer: Medicare Other

## 2021-08-30 NOTE — Progress Notes (Deleted)
HISTORY AND PHYSICAL     CC:  follow up. For EVAR Requesting Provider:  Curly Rim, MD  HPI: This is a 75 y.o. male who is here today for follow up for AAA and is s/p EVAR on  01/02/2019 by Dr. Trula Slade.  Pt was last seen 01/25/2021 by virtual visit and at that time, pt was doing well.   His endoleak appeared to have resolved by CT scan.  The maximum diameter was 6.6cm, which was slightly smaller.  He had a right internal iliac artery aneurysm that will need to be monitored.    The pt returns today for follow up.  He had a CT scan on 08/13/2021 and the native aneurysm sac measured 5.6 x 5.5cm, which was decreased from previously.  He continues to have a right internal iliac artery aneurysm just beyond the right iliac limb of the stent graft that measured 2.9 x 3.1cm, which was without significant change.  Given IV contrast was not used, endo leak could not be excluded however, there was no evidence of paraaortic /retroperitoneal hemorrhage.   The pt *** on a statin for cholesterol management.    The pt *** on an aspirin.    Other AC:  *** The pt *** on *** for hypertension.  The pt *** have diabetes. Tobacco hx:  ***  Pt does *** have family hx of AAA.  Past Medical History:  Diagnosis Date   AAA (abdominal aortic aneurysm) ([place]) 03/04/13   aortic duplex dopler- when compared tp previous study ther is no change. aneurysmal dilation measuring 3.5 x 3.4 cemtimeters   Anxiety    Chronic anticoagulation    Coronary artery disease 04/23/13   [facility] STRESS TEST-EF 52% High risk scan two separate perfusion defects and a suggestion of reveribility in the inferior-inferolateral wall.   Gout    History of DVT (deep vein thrombosis)    History of kidney stones    Hyperlipidemia    Hypertension 09/06/12   ECHO-EF 45%    Metatarsalgia of both feet    Pancreatic cyst    Pancreatic lesion    PE (pulmonary embolism)    PVD (peripheral vascular disease) (HCC)    Sleep apnea    No cpap   TIA  (transient ischemic attack)     Past Surgical History:  Procedure Laterality Date   ABDOMINAL AORTIC ENDOVASCULAR STENT GRAFT  01/02/2019   ABDOMINAL AORTIC ENDOVASCULAR STENT GRAFT N/A 01/02/2019   Procedure: ABDOMINAL AORTIC ENDOVASCULAR STENT GRAFT;  Surgeon: Serafina Mitchell, MD;  Location: [facility] OR;  Service: Vascular;  Laterality: N/A;   AORTIC VALVE REPLACEMENT     Using a 25 mm homograft with reimplanation of his coronary arteries   BACK SURGERY     CORONARY ARTERY BYPASS GRAFT  2005   By Dr Cyndia Bent, He had a LIMA placed to the LAD, a vien to the diagonal, a vein to the marginal, a vein to the 2nd diagonal, a vein  to the PDA, PLA vessel.   IR ANGIOGRAM PELVIS SELECTIVE OR SUPRASELECTIVE  06/12/2020   IR ANGIOGRAM SELECTIVE EACH ADDITIONAL VESSEL  06/12/2020   IR ANGIOGRAM SELECTIVE EACH ADDITIONAL VESSEL  06/12/2020   IR ANGIOGRAM SELECTIVE EACH ADDITIONAL VESSEL  06/12/2020   IR ANGIOGRAM VISCERAL SELECTIVE  06/12/2020   IR EMBO ARTERIAL NOT HEMORR HEMANG INC GUIDE ROADMAPPING  06/12/2020   IR RADIOLOGIST EVAL & MGMT  04/22/2020   IR RADIOLOGIST EVAL & MGMT  07/08/2020   IR RADIOLOGIST EVAL &  MGMT  08/17/2021   IR US GUIDE VASC ACCESS LEFT  06/12/2020    No Known Allergies  Current Outpatient Medications  Medication Sig Dispense Refill   allopurinol (ZYLOPRIM) 300 MG tablet Take 300 mg by mouth daily.   4   aspirin 81 MG tablet Take 81 mg by mouth daily.     Coenzyme Q10 (CO Q-10) 200 MG CAPS Take 200 mg by mouth daily.     colchicine 0.6 MG tablet Take 0.6 mg by mouth daily as needed (gout).      escitalopram (LEXAPRO) 10 MG tablet Take 5 mg by mouth daily.      ezetimibe (ZETIA) 10 MG tablet Take 1 tablet (10 mg total) by mouth daily. 90 tablet 3   furosemide (LASIX) 40 MG tablet Take 20 mg by mouth daily as needed for fluid.      irbesartan-hydrochlorothiazide (AVALIDE) 150-12.5 MG per tablet Take 0.5 tablets by mouth daily.      metoprolol succinate (TOPROL-XL) 25 MG 24 hr tablet  TAKE 1/2 TABLET BY MOUTH EVERY DAY 45 tablet 1   nitroGLYCERIN (NITROSTAT) 0.4 MG SL tablet PLACE 1 TABLET (0.4 MG TOTAL) UNDER THE TONGUE EVERY 5 (FIVE) MINUTES AS NEEDED FOR CHEST PAIN. (Patient taking differently: Place 0.4 mg under the tongue every 5 (five) minutes as needed for chest pain. PLACE 1 TABLET (0.4 MG TOTAL) UNDER THE TONGUE EVERY 5 (FIVE) MINUTES AS NEEDED FOR CHEST PAIN.) 25 tablet 6   pravastatin (PRAVACHOL) 20 MG tablet Take 20 mg by mouth daily.   4   predniSONE (DELTASONE) 10 MG tablet Take 10 mg by mouth as needed (gout flare up).      warfarin (COUMADIN) 5 MG tablet Take 5 mg by mouth daily.      No current facility-administered medications for this visit.    Family History  Problem Relation Age of Onset   Heart disease Mother    Heart disease Father    Cancer Sister    Heart disease Sister    Heart disease Brother     Social History   Socioeconomic History   Marital status: Divorced    Spouse name: Not on file   Number of children: 2   Years of education: Not on file   Highest education level: Not on file  Occupational History   Occupation: self employed logger  Tobacco Use   Smoking status: Never   Smokeless tobacco: Never  Vaping Use   Vaping Use: Never used  Substance and Sexual Activity   Alcohol use: No   Drug use: No   Sexual activity: Not on file  Other Topics Concern   Not on file  Social History Narrative   Not on file   Social Determinants of Health   Financial Resource Strain: Not on file  Food Insecurity: Not on file  Transportation Needs: Not on file  Physical Activity: Not on file  Stress: Not on file  Social Connections: Not on file  Intimate Partner Violence: Not on file     REVIEW OF SYSTEMS:  *** [X]  denotes positive finding, [ ]  denotes negative finding Cardiac  Comments:  Chest pain or chest pressure:    Shortness of breath upon exertion:    Short of breath when lying flat:    Irregular heart rhythm:         Vascular    Pain in calf, thigh, or hip brought on by ambulation:    Pain in feet at night that wakes you  up from your sleep:     Blood clot in your veins:    Leg swelling:         Pulmonary    Oxygen at home:    Productive cough:     Wheezing:         Neurologic    Sudden weakness in arms or legs:     Sudden numbness in arms or legs:     Sudden onset of difficulty speaking or slurred speech:    Temporary loss of vision in one eye:     Problems with dizziness:         Gastrointestinal    Blood in stool:     Vomited blood:         Genitourinary    Burning when urinating:     Blood in urine:        Psychiatric    Major depression:         Hematologic    Bleeding problems:    Problems with blood clotting too easily:        Skin    Rashes or ulcers:        Constitutional    Fever or chills:      PHYSICAL EXAMINATION:  ***  General:  WDWN in NAD; vital signs documented above Gait: Not observed HENT: WNL, normocephalic Pulmonary: normal non-labored breathing , without wheezing Cardiac: {Desc; regular/irreg:14544} HR, without  Murmur; {With/Without:20273} carotid bruit*** Abdomen: soft, NT, no masses; aortic pulse is *** palpable Skin: {With/Without:20273} rashes Vascular Exam/Pulses:  Right Left  Radial {Exam; arterial pulse strength 0-4:30167} {Exam; arterial pulse strength 0-4:30167}  Femoral {Exam; arterial pulse strength 0-4:30167} {Exam; arterial pulse strength 0-4:30167}  Popliteal {Exam; arterial pulse strength 0-4:30167} {Exam; arterial pulse strength 0-4:30167}  DP {Exam; arterial pulse strength 0-4:30167} {Exam; arterial pulse strength 0-4:30167}  PT {Exam; arterial pulse strength 0-4:30167} {Exam; arterial pulse strength 0-4:30167}   Extremities: {With/Without:20273} ischemic changes, {With/Without:20273} Gangrene , {With/Without:20273} cellulitis; {With/Without:20273} open wounds;  Musculoskeletal: no muscle wasting or atrophy  Neurologic: A&O X 3;   No focal weakness or paresthesias are detected Psychiatric:  The pt has {Desc; normal/abnormal:11317::"Normal"} affect.   Non-Invasive Vascular Imaging:   CT scan 08/13/2021: IMPRESSION: Aorto bi-iliac stent graft remains in place. Further decrease in size of native aneurysm sac since prior exam. While all endoleaks cannot be excluded on this noncontrast exam, there is no evidence of paraaortic/retroperitoneal hemorrhage.   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   Increased size of 4.6 cm complex lesion in lower pole of right kidney, which cannot be characterized without IV contrast, and could represent a complex cyst or mass. Renal protocol abdomen CT without and with contrast is recommended for further characterization.   Colonic diverticulosis. No radiographic evidence of diverticulitis.   Cholelithiasis. No radiographic evidence of cholecystitis.   Tiny nonobstructing right renal calculi. No evidence of ureteral calculi or hydronephrosis.  Carotid duplex 11/2018 1-39% Bilateral ICA stenosis   ASSESSMENT/PLAN:: 75 y.o. male here with hx of EVAR on 01/02/2019 by Dr. Trula Slade.  -*** -pt will f/u in *** with ***.   *** [facility] [phone]  Clinic MD:   ***

## 2021-09-14 ENCOUNTER — Encounter (HOSPITAL_COMMUNITY): Payer: Self-pay | Admitting: Radiology

## 2022-07-22 ENCOUNTER — Other Ambulatory Visit: Payer: Self-pay | Admitting: Interventional Radiology

## 2022-07-22 DIAGNOSIS — I9789 Other postprocedural complications and disorders of the circulatory system, not elsewhere classified: Secondary | ICD-10-CM

## 2022-08-09 ENCOUNTER — Ambulatory Visit (HOSPITAL_COMMUNITY)
Admission: RE | Admit: 2022-08-09 | Discharge: 2022-08-09 | Disposition: A | Discharge status: Home / Self Care | Payer: Medicare Other | Source: Ambulatory Visit | Attending: Interventional Radiology | Admitting: Interventional Radiology

## 2022-08-09 DIAGNOSIS — N200 Calculus of kidney: Secondary | ICD-10-CM | POA: Insufficient documentation

## 2022-08-09 DIAGNOSIS — K8020 Calculus of gallbladder without cholecystitis without obstruction: Secondary | ICD-10-CM | POA: Diagnosis not present

## 2022-08-09 DIAGNOSIS — I9789 Other postprocedural complications and disorders of the circulatory system, not elsewhere classified: Secondary | ICD-10-CM | POA: Insufficient documentation

## 2022-08-16 ENCOUNTER — Ambulatory Visit
Admission: RE | Admit: 2022-08-16 | Discharge: 2022-08-16 | Disposition: A | Discharge status: Home / Self Care | Payer: Medicare Other | Source: Ambulatory Visit | Attending: Interventional Radiology | Admitting: Interventional Radiology

## 2022-08-16 DIAGNOSIS — I9789 Other postprocedural complications and disorders of the circulatory system, not elsewhere classified: Secondary | ICD-10-CM

## 2022-08-16 HISTORY — PX: IR RADIOLOGIST EVAL & MGMT: IMG5224

## 2022-08-16 NOTE — Progress Notes (Signed)
Chief Complaint: Follow up after Endoleak Repair    Referring Physician(s): Harold Barban W  Urology: Dr. Jeffie Pollock   History of Present Illness: Kenneth Scott is a 76 y.o. male presenting as a scheduled follow up to [place] clinic today, for his post-operative visit after transarterial embolization of type II endoleak.    Today is a virtual visit.  His wife is on  the call with him.  We confirmed his identity with 2 identifiers.    Hx:  We treated him 06/12/2020 on out-patient basis at [facility], [place], with left CFA approach for transarterial embolization.  The successful pathway was via the SMA --> IMA, and we embolized the sac with Onyx liquid embolic.    CT imaging before the endoleak repair showed greatest diameter of 6.1cm (03/23/20), and then 6.0cm (01/22/21),  CT on 9/16 showing decrease to 5.5cm. No inflammatory changes.    Interval Hx:  We last saw Kenneth Scott 08/17/21.  Since then, he denies any new symptoms such as back pain or new abdominal pain.  He denies any new symptoms, including no new symptoms of claudication.   We discussed most recent non-contrast CT 08/13/21.  The size is essentially unchanged, by my estimate ~5.5cm.    Again Kenneth Scott has bilateral renal lesions, which have grown over time, both on the right and left.  The right is most suspicious at the right inferior kidney.    He says he has follow up with Urology on 9/25 coming up.    Past Medical History:  Diagnosis Date   AAA (abdominal aortic aneurysm) ([place]) 03/04/13   aortic duplex dopler- when compared tp previous study ther is no change. aneurysmal dilation measuring 3.5 x 3.4 cemtimeters   Anxiety    Chronic anticoagulation    Coronary artery disease 04/23/13   NUC STRESS TEST-EF 52% High risk scan two separate perfusion defects and a suggestion of reveribility in the inferior-inferolateral wall.   Gout    History of DVT (deep vein thrombosis)    History of kidney stones    Hyperlipidemia    Hypertension  09/06/12   ECHO-EF 45%    Metatarsalgia of both feet    Pancreatic cyst    Pancreatic lesion    PE (pulmonary embolism)    PVD (peripheral vascular disease) (HCC)    Sleep apnea    No cpap   TIA (transient ischemic attack)     Past Surgical History:  Procedure Laterality Date   ABDOMINAL AORTIC ENDOVASCULAR STENT GRAFT  01/02/2019   ABDOMINAL AORTIC ENDOVASCULAR STENT GRAFT N/A 01/02/2019   Procedure: ABDOMINAL AORTIC ENDOVASCULAR STENT GRAFT;  Surgeon: Serafina Mitchell, MD;  Location: [facility] OR;  Service: Vascular;  Laterality: N/A;   AORTIC VALVE REPLACEMENT     Using a 25 mm homograft with reimplanation of his coronary arteries   BACK SURGERY     CORONARY ARTERY BYPASS GRAFT  2005   By Dr Cyndia Bent, He had a LIMA placed to the LAD, a vien to the diagonal, a vein to the marginal, a vein to the 2nd diagonal, a vein  to the PDA, PLA vessel.   IR ANGIOGRAM PELVIS SELECTIVE OR SUPRASELECTIVE  06/12/2020   IR ANGIOGRAM SELECTIVE EACH ADDITIONAL VESSEL  06/12/2020   IR ANGIOGRAM SELECTIVE EACH ADDITIONAL VESSEL  06/12/2020   IR ANGIOGRAM SELECTIVE EACH ADDITIONAL VESSEL  06/12/2020   IR ANGIOGRAM VISCERAL SELECTIVE  06/12/2020   IR EMBO ARTERIAL NOT HEMORR HEMANG INC GUIDE ROADMAPPING  06/12/2020   IR  RADIOLOGIST EVAL & MGMT  04/22/2020   IR RADIOLOGIST EVAL & MGMT  07/08/2020   IR RADIOLOGIST EVAL & MGMT  08/17/2021   IR US GUIDE VASC ACCESS LEFT  06/12/2020    Allergies: Patient has no known allergies.  Medications: Prior to Admission medications   Medication Sig Start Date End Date Taking? Authorizing Provider  allopurinol (ZYLOPRIM) 300 MG tablet Take 300 mg by mouth daily.  07/25/16   [provider]  aspirin 81 MG tablet Take 81 mg by mouth daily.    [provider]  Coenzyme Q10 (CO Q-10) 200 MG CAPS Take 200 mg by mouth daily.    [provider]  colchicine 0.6 MG tablet Take 0.6 mg by mouth daily as needed (gout).     [provider]  escitalopram  (LEXAPRO) 10 MG tablet Take 5 mg by mouth daily.     [provider]  ezetimibe (ZETIA) 10 MG tablet Take 1 tablet (10 mg total) by mouth daily. 06/16/20 09/14/20  Deberah Pelton, NP  furosemide (LASIX) 40 MG tablet Take 20 mg by mouth daily as needed for fluid.     [provider]  irbesartan-hydrochlorothiazide (AVALIDE) 150-12.5 MG per tablet Take 0.5 tablets by mouth daily.     [provider]  metoprolol succinate (TOPROL-XL) 25 MG 24 hr tablet TAKE 1/2 TABLET BY MOUTH EVERY DAY 11/11/20   Troy Sine, MD  nitroGLYCERIN (NITROSTAT) 0.4 MG SL tablet PLACE 1 TABLET (0.4 MG TOTAL) UNDER THE TONGUE EVERY 5 (FIVE) MINUTES AS NEEDED FOR CHEST PAIN. Patient taking differently: Place 0.4 mg under the tongue every 5 (five) minutes as needed for chest pain. PLACE 1 TABLET (0.4 MG TOTAL) UNDER THE TONGUE EVERY 5 (FIVE) MINUTES AS NEEDED FOR CHEST PAIN. 10/17/17   Troy Sine, MD  pravastatin (PRAVACHOL) 20 MG tablet Take 20 mg by mouth daily.  07/25/16   [provider]  predniSONE (DELTASONE) 10 MG tablet Take 10 mg by mouth as needed (gout flare up).  03/31/13   [provider]  warfarin (COUMADIN) 5 MG tablet Take 5 mg by mouth daily.     [provider]     Family History  Problem Relation Age of Onset   Heart disease Mother    Heart disease Father    Cancer Sister    Heart disease Sister    Heart disease Brother     Social History   Socioeconomic History   Marital status: Divorced    Spouse name: Not on file   Number of children: 2   Years of education: Not on file   Highest education level: Not on file  Occupational History   Occupation: self employed logger  Tobacco Use   Smoking status: Never   Smokeless tobacco: Never  Vaping Use   Vaping Use: Never used  Substance and Sexual Activity   Alcohol use: No   Drug use: No   Sexual activity: Not on file  Other Topics Concern   Not on file  Social History Narrative    Not on file   Social Determinants of Health   Financial Resource Strain: Not on file  Food Insecurity: Not on file  Transportation Needs: Not on file  Physical Activity: Not on file  Stress: Not on file  Social Connections: Not on file       Review of Systems  Review of Systems: A 12 point ROS discussed and pertinent positives are indicated in the HPI  above.  All other systems are negative.  Advance Care Plan: The advanced care plan/surrogate decision maker was discussed at the time of visit and documented in the medical record.    Physical Exam No direct physical exam was performed (except for noted visual exam findings with Video Visits).    Vital Signs: There were no vitals taken for this visit.  Imaging: CT ABDOMEN PELVIS WO CONTRAST  Result Date: 08/10/2022 CLINICAL DATA:  Follow-up aortic stent graph. EXAM: CT ABDOMEN AND PELVIS WITHOUT CONTRAST TECHNIQUE: Multidetector CT imaging of the abdomen and pelvis was performed following the standard protocol without IV contrast. RADIATION DOSE REDUCTION: This exam was performed according to the departmental dose-optimization program which includes automated exposure control, adjustment of the mA and/or kV according to patient size and/or use of iterative reconstruction technique. COMPARISON:  08/13/2021 and 01/22/2021 as well as older exams. FINDINGS: Lower chest: No acute abnormality. Hepatobiliary: Liver normal in size and overall attenuation. Stable left lobe low-attenuation mass, 1 cm, consistent with a cyst. Small dependent gallstones. No acute cholecystitis. No bile duct dilation. Pancreas: Unremarkable. No pancreatic ductal dilatation or surrounding inflammatory changes. Spleen: Normal in size without focal abnormality. Adrenals/Urinary Tract: No adrenal masses. Bilateral low-attenuation renal masses as well as mixed a higher attenuation masses consistent with a combination of simple and complicated cysts. Largest complicated  appearing cyst on the right is from the lower pole, 5.6 cm. Largest on the left is from the posterolateral mid to upper pole, 5.5 cm. Both of these lesions have mildly increased in size from the prior exam, lesion on the left measures 6.9 cm superior to inferior, 6.2 cm previously. Lesion on the right measures 5 cm in greatest dimension, previously 4.6 cm. Small nonobstructing stones noted within the right kidney similar to the prior exams. No hydronephrosis. Ureters are normal in course and in caliber. Bladder is unremarkable. Stomach/Bowel: Small hiatal hernia. Stomach is otherwise unremarkable. Small bowel and colon are normal in caliber. No wall thickening. No inflammation. Normal appendix visualized. Stable left colon diverticula. Vascular/Lymphatic: Previous stent graft repair of an abdominal aortic aneurysm. Native aneurysm measures 5.9 cm in greatest diameter. Stents are also noted within bilateral dilated, iliac arteries, right 2.6 cm, left 2.6 cm. 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 These measurements are stable. No gross evidence of leakage. Endoleak cannot be assessed on this unenhanced exam. No enlarged lymph nodes. Reproductive: Prominent but stable prostate, 4.9 x 3.9 cm transversely. Other: No abdominal wall hernia or abnormality. No abdominopelvic ascites. Musculoskeletal: No fracture or acute finding. No suspicious bone lesion. IMPRESSION: 1. No change in the appearance of the aorta bi-iliac stent graft or in the size of the native aortic aneurysm when compared to the exams from 08/13/2021 and 08/24/2021. No evidence of rupture. Endoleak not excluded on this unenhanced exam. 2. Mass arising from the lower pole of the right kidney is larger than on the prior exams, previously interpreted as suspicious for renal cell carcinoma on the without and with contrast is exam from 08/24/2021. Consider further characterization of this suspicious mass with  renal ultrasound. 3. Similar appearing mass arises from the mid to upper pole the left kidney, which has mildly increased in size from the prior studies. Other masses consistent with cysts are without significant change. 4. Small nonobstructing intrarenal stones on the right and small dependent gallstones are stable from the prior CTs. 5. No acute findings. Electronically Signed   By:  Lajean Manes M.D.   On: 08/10/2022 14:07    Labs:  CBC: No results for input(s): "WBC", "HGB", "HCT", "PLT" in the last 8760 hours.  COAGS: No results for input(s): "INR", "APTT" in the last 8760 hours.  BMP: No results for input(s): "NA", "K", "CL", "CO2", "GLUCOSE", "BUN", "CALCIUM", "CREATININE", "GFRNONAA", "GFRAA" in the last 8760 hours.  Invalid input(s): "CMP"  LIVER FUNCTION TESTS: No results for input(s): "BILITOT", "AST", "ALT", "ALKPHOS", "PROT", "ALBUMIN" in the last 8760 hours.  TUMOR MARKERS: No results for input(s): "AFPTM", "CEA", "CA199", "CHROMGRNA" in the last 8760 hours.  Assessment and Plan:   Kenneth Rafalski is 76 yo male with previous trans-arterial embolization of type II endoleak.   His CT imaging shows decreased size of the aneurysm sac after embolization, and he has no symptoms.    I reinforced the need with him for surveillance at this point, likely with non-contrast CT study unless there was any problem identified.  We will plan on the next CT in 12 months.  We also dicussed continuing medical therapy.     He will be getting back with Urology this month regarding suspicious renal lesions.    Plan: - Follow up visit in 12 months with non-contrast abd/pelvis CT.   - He says he has upcoming follow up with Urology on 9/25. - Continue medical therapy     Electronically Signed: Corrie Mckusick 08/16/2022, 9:05 AM   I spent a total of    15 Minutes in remote  clinical consultation, greater than 50% of which was counseling/coordinating care for surveillance of previously treated  endoleak.    Visit type: Audio only (telephone). Audio (no video) only due to patient's lack of internet/smartphone capability. Alternative for in-person consultation at [facility], [address] [place], [place], [place]. This visit type was conducted due to national recommendations for restrictions regarding the COVID-19 Pandemic (e.g. social distancing).  This format is felt to be most appropriate for this patient at this time.  All issues noted in this document were discussed and addressed.

## 2022-09-01 ENCOUNTER — Other Ambulatory Visit: Payer: Self-pay | Admitting: Urology

## 2022-09-01 DIAGNOSIS — C649 Malignant neoplasm of unspecified kidney, except renal pelvis: Secondary | ICD-10-CM

## 2022-09-18 ENCOUNTER — Ambulatory Visit
Admission: RE | Admit: 2022-09-18 | Discharge: 2022-09-18 | Disposition: A | Discharge status: Home / Self Care | Payer: Medicare Other | Source: Ambulatory Visit | Attending: Urology | Admitting: Urology

## 2022-09-18 DIAGNOSIS — C649 Malignant neoplasm of unspecified kidney, except renal pelvis: Secondary | ICD-10-CM

## 2022-09-18 MED ORDER — GADOPICLENOL 0.5 MMOL/ML IV SOLN
10.0000 mL | Freq: Once | INTRAVENOUS | Status: AC | PRN
  Administered 2022-09-18: 10 mL via INTRAVENOUS

## 2023-03-01 ENCOUNTER — Ambulatory Visit: Payer: Medicare Other | Admitting: General Practice

## 2023-03-08 NOTE — Progress Notes (Unsigned)
Cardiology Clinic Note   Patient Name: Kenneth Scott Date of Encounter: 03/09/2023  Primary Care Provider:  Vivien Scott, Kenneth A, MD Primary Cardiologist:  Kenneth Guadalajarahomas Kelly, MD  Patient Profile    Kenneth Scott [age] year old male presents to the clinic today for preoperative cardiac evaluation and follow-up of his CAD, hypertension, and AAA.   Past Medical History    Past Medical History:  Diagnosis Date   AAA (abdominal aortic aneurysm) 03/04/13   aortic duplex dopler- when compared tp previous study ther is no change. aneurysmal dilation measuring 3.5 x 3.4 cemtimeters   Anxiety    Chronic anticoagulation    Coronary artery disease 04/23/13   NUC STRESS TEST-EF 52% High risk scan two separate perfusion defects and Scott suggestion of reveribility in the inferior-inferolateral wall.   Gout    History of DVT (deep vein thrombosis)    History of kidney stones    Hyperlipidemia    Hypertension 09/06/12   ECHO-EF 45%    Metatarsalgia of both feet    Pancreatic cyst    Pancreatic lesion    PE (pulmonary embolism)    PVD (peripheral vascular disease)    Sleep apnea    No cpap   TIA (transient ischemic attack)    Past Surgical History:  Procedure Laterality Date   ABDOMINAL AORTIC ENDOVASCULAR STENT GRAFT  01/02/2019   ABDOMINAL AORTIC ENDOVASCULAR STENT GRAFT N/Scott 01/02/2019   Procedure: ABDOMINAL AORTIC ENDOVASCULAR STENT GRAFT;  Surgeon: Nada LibmanBrabham, Vance W, MD;  Location: [facility] OR;  Service: Vascular;  Laterality: N/Scott;   AORTIC VALVE REPLACEMENT     Using Scott 25 mm homograft with reimplanation of his coronary arteries   BACK SURGERY     CORONARY ARTERY BYPASS GRAFT  2005   By Dr Laneta SimmersBartle, He had Scott LIMA placed to the LAD, Scott vien to the diagonal, Scott vein to the marginal, Scott vein to the 2nd diagonal, Scott vein  to the PDA, PLA vessel.   IR ANGIOGRAM PELVIS SELECTIVE OR SUPRASELECTIVE  06/12/2020   IR ANGIOGRAM SELECTIVE EACH ADDITIONAL VESSEL  06/12/2020   IR ANGIOGRAM SELECTIVE EACH ADDITIONAL  VESSEL  06/12/2020   IR ANGIOGRAM SELECTIVE EACH ADDITIONAL VESSEL  06/12/2020   IR ANGIOGRAM VISCERAL SELECTIVE  06/12/2020   IR EMBO ARTERIAL NOT HEMORR HEMANG INC GUIDE ROADMAPPING  06/12/2020   IR RADIOLOGIST EVAL & MGMT  04/22/2020   IR RADIOLOGIST EVAL & MGMT  07/08/2020   IR RADIOLOGIST EVAL & MGMT  08/17/2021   IR RADIOLOGIST EVAL & MGMT  08/16/2022   IR US GUIDE VASC ACCESS LEFT  06/12/2020    Allergies  No Known Allergies  History of Present Illness    Mr. Kenneth Scott has Scott PMH of coronary artery disease status post anterior MI in 2005.  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  His nuclear stress test 2014 showed Scott fixed defect at the apex consistent with previous MI.  Mild reversibility in the inferior, inferior lateral and lateral area were noted however, these were not significant changes from his prior study.  His echocardiogram 9/17 showed an EF of 50-55%, akinesis of the mid apical anteroseptal myocardium, G2 DD, mild LAE, mild AI.  He has been followed by vascular surgery.  Recent carotid Dopplers obtained on 1/20 showed mild carotid disease and bilateral ICA.  Bilateral ABI were normal.  Scott CT angiogram  of the abdomen showed previous abdominal aortic aneurysm had increased from 3.4 cm to 5.2 cm.  Due to the size of AAA he underwent endovascular repair.   He presented to the clinic on 12/28/2018 and was seen by Kenneth Course, PA-C.  During that time he denied chest pain, increased dyspnea with exertion.  Scott nuclear stress test was  recommended prior to surgery.  It showed no ischemia, and old infarct pattern with an EF of 55%.   He presented the clinic 05/22/20 for follow-up evaluation and preoperative cardiac clearance.  He stated he felt well.  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  He stated that his  son's had asked him to cut back on his workload however, he enjoyed working.  He stated that his diet was good but he had some indiscretion with salt.  He denied activity intolerance, exertional chest discomfort, and  DOE.  I  ordered Scott lipid panel and BMP .  I will gave him salty 6 diet sheet, completed preoperative cardiac evaluation  for his upcoming surgery and planned follow-up with Dr. Tresa Endo in 6 months.    He presents to the clinic today for follow-up evaluation and states he feels well.  He was lost to follow-up.  He reports that he did not receive Scott cardiology notification for follow-up appointment.  He continues to work with his heavy equipment 6 days/week.  He does report some dietary indiscretion.  We reviewed the importance of low-sodium diet.  He and his wife expressed understanding.  He reports that his youngest son passed away.  His blood pressure is well-controlled.  His EKG shows sinus bradycardia 56 bpm.  I will order Scott BMP and fasting lipids and LFTs.  Will plan follow-up in 12 months.  I will give him the salty 6 diet sheet.   Today he denies chest pain, shortness of breath, lower extremity edema, fatigue, palpitations, melena, hematuria, hemoptysis, diaphoresis, weakness, presyncope, syncope, orthopnea, and PND.    Home Medications    Prior to Admission medications   Medication Sig Start Date End Date Taking? Authorizing Provider  allopurinol (ZYLOPRIM) 300 MG tablet Take 300 mg by mouth daily.  07/25/16   [provider]  aspirin 81 MG tablet Take 81 mg by mouth daily.    [provider]  Coenzyme Q10 (CO Q-10) 200 MG CAPS Take 200 mg by mouth daily.    [provider]  colchicine 0.6 MG tablet Take 0.6 mg by mouth daily as needed (gout).     [provider]  escitalopram (LEXAPRO) 10 MG tablet Take 5 mg by mouth daily.     [provider]  ezetimibe (ZETIA) 10 MG tablet Take 1 tablet (10 mg total) by mouth daily. 06/16/20 09/14/20   Kenneth Asters, NP  furosemide (LASIX) 40 MG tablet Take 20 mg by mouth daily as needed for fluid.     [provider]  irbesartan-hydrochlorothiazide (AVALIDE) 150-12.5 MG per tablet Take 0.5 tablets by mouth daily.     [provider]  metoprolol succinate (TOPROL-XL) 25 MG 24 hr tablet TAKE 1/2 TABLET BY MOUTH EVERY DAY 11/11/20   Lennette Bihari, MD  nitroGLYCERIN (NITROSTAT) 0.4 MG SL tablet PLACE 1 TABLET (0.4 MG TOTAL) UNDER THE TONGUE EVERY 5 (FIVE) MINUTES AS NEEDED FOR CHEST PAIN. Patient taking differently: Place 0.4 mg under the tongue every 5 (five) minutes as needed for chest pain. PLACE 1 TABLET (0.4 MG TOTAL) UNDER THE  TONGUE EVERY 5 (FIVE) MINUTES AS NEEDED FOR CHEST PAIN. 10/17/17   Lennette Bihari, MD  pravastatin (PRAVACHOL) 20 MG tablet Take 20 mg by mouth daily.  07/25/16   [provider]  predniSONE (DELTASONE) 10 MG tablet Take 10 mg by mouth as needed (gout flare up).  03/31/13   [provider]  warfarin (COUMADIN) 5 MG tablet Take 5 mg by mouth daily.     [provider]    Family History    Family History  Problem Relation Age of Onset   Heart disease Mother    Heart disease Father    Cancer Sister    Heart disease Sister    Heart disease Brother    He indicated that his mother is deceased. He indicated that his father is deceased. He indicated that his sister is deceased. He indicated that the status of his brother is unknown.  Social History    Social History   Socioeconomic History   Marital status: Divorced    Spouse name: Not on file   Number of children: 2   Years of education: Not on file   Highest education level: Not on file  Occupational History   Occupation: self employed logger  Tobacco Use   Smoking status: Never   Smokeless tobacco: Never  Vaping Use   Vaping Use: Never used  Substance and Sexual Activity   Alcohol use: No   Drug use: No   Sexual activity: Not on file  Other Topics  Concern   Not on file  Social History Narrative   Not on file   Social Determinants of Health   Financial Resource Strain: Not on file  Food Insecurity: Not on file  Transportation Needs: Not on file  Physical Activity: Not on file  Stress: Not on file  Social Connections: Not on file  Intimate Partner Violence: Not on file     Review of Systems    General:  No chills, fever, night sweats or weight changes.  Cardiovascular:  No chest pain, dyspnea on exertion, edema, orthopnea, palpitations, paroxysmal nocturnal dyspnea. Dermatological: No rash, lesions/masses Respiratory: No cough, dyspnea Urologic: No hematuria, dysuria Abdominal:   No nausea, vomiting, diarrhea, bright red blood per rectum, melena, or hematemesis Neurologic:  No visual changes, wkns, changes in mental status. All other systems reviewed and are otherwise negative except as noted above.  Physical Exam    VS:  BP 134/84   Pulse (!) 53   Ht 5\' 8"  (1.727 m)   Wt 212 lb 3.2 oz (96.3 kg)   SpO2 97%   BMI 32.26 kg/m  , BMI Body mass index is 32.26 kg/m. GEN: Well nourished, well developed, in no acute distress. HEENT: normal. Neck: Supple, no JVD, carotid bruits, or masses. Cardiac: RRR, no murmurs, rubs, or gallops. No clubbing, cyanosis, edema.  Radials/DP/PT 2+ and equal bilaterally.  Respiratory:  Respirations regular and unlabored, clear to auscultation bilaterally. GI: Soft, nontender, nondistended, BS + x 4. MS: no deformity or atrophy. Skin: warm and dry, no rash. Neuro:  Strength and sensation are intact. Psych: Normal affect.  Accessory Clinical Findings    Recent Labs: No results found for requested labs within last 365 days.   Recent Lipid Panel    Component Value Date/Time   CHOL 146 05/22/2020 0846   TRIG 201 (H) 05/22/2020 0846   HDL 29 (L) 05/22/2020 0846   CHOLHDL 5.0 05/22/2020 0846   CHOLHDL 4.6 07/29/2016 0845   VLDL 43 (  H) 07/29/2016 0845   LDLCALC 83 05/22/2020 0846          ECG personally reviewed by me today-sinus bradycardia 56 bpm- No acute changes  EKG 05/22/2020 sinus bradycardia with sinus arrhythmia 57 bpm- No acute changes   EKG 12/28/2018 Sinus bradycardia 47 bpm   Echocardiogram 08/12/2016 Study Conclusions   - Left ventricle: The cavity size was normal. Wall thickness was    increased in Scott pattern of mild LVH. Systolic function was normal.    The estimated ejection fraction was in the range of 50% to 55%.    There is akinesis of the mid-apicalanteroseptal myocardium.    Features are consistent with Scott pseudonormal left ventricular    filling pattern, with concomitant abnormal relaxation and    increased filling pressure (grade 2 diastolic dysfunction).  - Aortic valve: An aortic root homograft was present. There was    mild regurgitation.  - Left atrium: The atrium was mildly dilated.  Assessment & Plan   1.  Coronary artery disease-no chest pain today.  Status post CABG x6 Continue continue aspirin, Avalide, metoprolol, nitroglycerin Heart healthy low-sodium diet-salty 6 given Increase physical activity as tolerated Order BMP  Essential hypertension-BP today 134/84.   Continue metoprolol, Avalide Heart healthy low-sodium diet-salty 6 given Increase physical activity as tolerated Order BMP   History of DVT/PE-Coumadin managed by PCP office.  Denies bleeding issues. Continue Coumadin   History of thoracic aortic aneurysm-denies episodes of back and chest discomfort.  No increased DOE or activity intolerance status post aortic resection and AVR.    Hyperlipidemia-LDL 83 on 05/22/20 Continue statin Heart healthy low-sodium high-fiber diet Increase physical activity as tolerated Order lipid panel  Disposition: Follow-up with Dr. Tresa Endo or me in 12 months.   Thomasene Ripple. Charish Schroepfer NP-C     03/09/2023, 8:23 AM [facility] [address] Office [phone] Fax [phone]    I  spent 14 minutes examining this patient, reviewing medications, and using patient centered shared decision making involving her cardiac care.  Prior to her visit I spent greater than 20 minutes reviewing her past medical history,  medications, and prior cardiac tests.

## 2023-03-09 ENCOUNTER — Ambulatory Visit: Payer: Medicare Other | Attending: General Practice | Admitting: General Practice

## 2023-03-09 ENCOUNTER — Encounter: Payer: Self-pay | Admitting: General Practice

## 2023-03-09 VITALS — BP 134/84 | HR 53 | Ht 68.0 in | Wt 212.2 lb

## 2023-03-09 DIAGNOSIS — Z951 Presence of aortocoronary bypass graft: Secondary | ICD-10-CM | POA: Diagnosis present

## 2023-03-09 DIAGNOSIS — Z952 Presence of prosthetic heart valve: Secondary | ICD-10-CM | POA: Diagnosis present

## 2023-03-09 DIAGNOSIS — I10 Essential (primary) hypertension: Secondary | ICD-10-CM | POA: Insufficient documentation

## 2023-03-09 DIAGNOSIS — I2510 Atherosclerotic heart disease of native coronary artery without angina pectoris: Secondary | ICD-10-CM | POA: Diagnosis present

## 2023-03-09 DIAGNOSIS — Z86718 Personal history of other venous thrombosis and embolism: Secondary | ICD-10-CM | POA: Insufficient documentation

## 2023-03-09 DIAGNOSIS — E782 Mixed hyperlipidemia: Secondary | ICD-10-CM

## 2023-03-09 NOTE — Patient Instructions (Signed)
Medication Instructions:  The current medical regimen is effective;  continue present plan and medications as directed. Please refer to the Current Medication list given to you today.  *If you need a refill on your cardiac medications before your next appointment, please call your pharmacy*  Lab Work: BMET,LFT AND LIPID If you have labs (blood work) drawn today and your tests are completely normal, you will receive your results only by: MyChart Message (if you have MyChart) OR  A paper copy in the mail If you have any lab test that is abnormal or we need to change your treatment, we will call you to review the results.  Testing/Procedures: NONE  Follow-Up: At [facility], you and your health needs are our priority.  As part of our continuing mission to provide you with exceptional heart care, we have created designated Provider Care Teams.  These Care Teams include your primary Cardiologist (physician) and Advanced Practice Providers (APPs -  Physician Assistants and Nurse Practitioners) who all work together to provide you with the care you need, when you need it.  We recommend signing up for the patient portal called "MyChart".  Sign up information is provided on this After Visit Summary.  MyChart is used to connect with patients for Virtual Visits (Telemedicine).  Patients are able to view lab/test results, encounter notes, upcoming appointments, etc.  Non-urgent messages can be sent to your provider as well.   To learn more about what you can do with MyChart, go to [email or web address].    Your next appointment:   12 month(s)  Provider:   Nicki Guadalajara, MD  or Edd Fabian, FNP      Other Instructions MAINTAIN PHYSICAL ACTIVITY  PLEASE READ AND FOLLOW ATTACHED  SALTY 6

## 2023-03-10 LAB — LIPID PANEL
Chol/HDL Ratio: 5.2 ratio — ABNORMAL HIGH (ref 0.0–5.0)
Cholesterol, Total: 176 mg/dL (ref 100–199)
HDL: 34 mg/dL — ABNORMAL LOW (ref >39)
LDL Chol Calc (NIH): 119 mg/dL — ABNORMAL HIGH (ref 0–99)
Triglycerides: 125 mg/dL (ref 0–149)
VLDL Cholesterol Cal: 23 mg/dL (ref 5–40)

## 2023-03-10 LAB — BASIC METABOLIC PANEL
BUN/Creatinine Ratio: 20 (ref 10–24)
BUN: 27 mg/dL (ref 8–27)
CO2: 26 mmol/L (ref 20–29)
Calcium: 9.7 mg/dL (ref 8.6–10.2)
Chloride: 101 mmol/L (ref 96–106)
Creatinine, Ser: 1.35 mg/dL — ABNORMAL HIGH (ref 0.76–1.27)
Glucose: 98 mg/dL (ref 70–99)
Potassium: 4.3 mmol/L (ref 3.5–5.2)
Sodium: 144 mmol/L (ref 134–144)
eGFR: 54 mL/min/{1.73_m2} — ABNORMAL LOW (ref >59)

## 2023-03-10 LAB — HEPATIC FUNCTION PANEL
ALT: 24 IU/L (ref 0–44)
AST: 27 IU/L (ref 0–40)
Albumin: 4.3 g/dL (ref 3.8–4.8)
Alkaline Phosphatase: 117 IU/L (ref 44–121)
Bilirubin Total: 0.4 mg/dL (ref 0.0–1.2)
Bilirubin, Direct: 0.14 mg/dL (ref 0.00–0.40)
Total Protein: 7.2 g/dL (ref 6.0–8.5)

## 2023-03-13 ENCOUNTER — Other Ambulatory Visit: Payer: Self-pay

## 2023-03-13 NOTE — Addendum Note (Signed)
Addended by: Missy Sabins on: 03/13/2023 09:56 AM   Modules accepted: Orders

## 2023-03-14 ENCOUNTER — Other Ambulatory Visit: Payer: Self-pay

## 2023-03-14 DIAGNOSIS — Z79899 Other long term (current) drug therapy: Secondary | ICD-10-CM

## 2023-03-14 DIAGNOSIS — I10 Essential (primary) hypertension: Secondary | ICD-10-CM

## 2023-03-14 DIAGNOSIS — I2510 Atherosclerotic heart disease of native coronary artery without angina pectoris: Secondary | ICD-10-CM

## 2023-03-14 MED ORDER — PRAVASTATIN SODIUM 40 MG PO TABS: 40.0000 mg | ORAL_TABLET | Freq: Every evening | ORAL | 2 refills | Status: DC

## 2023-08-11 ENCOUNTER — Other Ambulatory Visit: Payer: Self-pay | Admitting: Interventional Radiology

## 2023-08-11 DIAGNOSIS — I9789 Other postprocedural complications and disorders of the circulatory system, not elsewhere classified: Secondary | ICD-10-CM

## 2023-08-22 ENCOUNTER — Ambulatory Visit
Admission: RE | Admit: 2023-08-22 | Discharge: 2023-08-22 | Disposition: A | Discharge status: Home / Self Care | Payer: Medicare Other | Source: Ambulatory Visit | Attending: Interventional Radiology | Admitting: Interventional Radiology

## 2023-08-22 DIAGNOSIS — I9789 Other postprocedural complications and disorders of the circulatory system, not elsewhere classified: Secondary | ICD-10-CM

## 2023-09-01 ENCOUNTER — Telehealth: Payer: Medicare Other

## 2023-09-01 ENCOUNTER — Ambulatory Visit
Admission: RE | Admit: 2023-09-01 | Discharge: 2023-09-01 | Disposition: A | Discharge status: Home / Self Care | Payer: Medicare Other | Source: Ambulatory Visit | Attending: Interventional Radiology

## 2023-09-01 ENCOUNTER — Ambulatory Visit
Admission: RE | Admit: 2023-09-01 | Discharge: 2023-09-01 | Disposition: A | Discharge status: Home / Self Care | Payer: Medicare Other | Source: Ambulatory Visit | Attending: Interventional Radiology | Admitting: Interventional Radiology

## 2023-09-01 DIAGNOSIS — I9789 Other postprocedural complications and disorders of the circulatory system, not elsewhere classified: Secondary | ICD-10-CM

## 2023-09-01 HISTORY — PX: IR RADIOLOGIST EVAL & MGMT: IMG5224

## 2023-09-01 NOTE — Progress Notes (Signed)
Chief Complaint: Follow up after Endoleak Repair    Referring Physician(s): Coral Else W   Urology: Dr. Annabell Howells   History of Present Illness: NYRON MOZER is a 76y.o. male presenting as a scheduled follow up to VIR clinic today, for his post-operative visit after transarterial embolization of type II endoleak.    Today is a virtual visit.  Today he is at work.  We confirmed his identity with 2 identifiers.    Hx:  He has undergone EVAR for infrarenal AAA performed 01/02/2019.  He was repaired for primary protection, given enlarging aneurysm over time, with maximum size 5.2cm.   Device was a Biomedical scientist, main body 28 x 14 x 12 from the right, with extension 27 x 10.  Left limb was 27 x 14.  2 cuffs required proximally, each 28.5 x 3.3  Sac enlarged with endoleak after his treatment to largest diameter of 6.1cm on CT 03/23/20.   We treated him 06/12/2020 on out-patient basis at [facility], with left CFA approach for transarterial embolization.  The successful pathway was via the SMA --> IMA, and we embolized the sac with Onyx liquid embolic.     Interval Hx:  Mr Flink tells me today that he is doing well, and has no new complaints.  He has "occasional back pain" but there is nothing untowards on the updated CT 08/22/23, and he attributes it to "working this machine" and his prior surgeries.   I personally reviewed CT from 08/22/23 and the diameter is essentially the same as previous studies, now about 5.8cm, decreased from the pre-embo CT diameter of 6.1cm.     Again Mr Saha has bilateral renal lesions, which have grown over time, both on the right and left.  He tells me he is uncertain of the Urology plan.    I reviewed previous notes, previous imaging including current and comparison CT scans, previous operate notes, and previous labs.  His Cr on BMP 4/11 was 1.35.    Past Medical History:  Diagnosis Date   AAA (abdominal aortic aneurysm) (HCC) 03/04/13   aortic duplex dopler-  when compared tp previous study ther is no change. aneurysmal dilation measuring 3.5 x 3.4 cemtimeters   Anxiety    Chronic anticoagulation    Coronary artery disease 04/23/13   NU[facility] STRESS TEST-EF 52% High risk scan two separate perfusion defects and a suggestion of reveribility in the inferior-inferolateral wall.   Gout    History of DVT (deep vein thrombosis)    History of kidney stones    Hyperlipidemia    Hypertension 09/06/12   ECHO-EF 45%    Metatarsalgia of both feet    Pancreatic cyst    Pancreatic lesion    PE (pulmonary embolism)    PVD (peripheral vascular disease) (HCC)    Sleep apnea    No cpap   TIA (transient ischemic attack)     Past Surgical History:  Procedure Laterality Date   ABDOMINAL AORTIC ENDOVASCULAR STENT GRAFT  01/02/2019   ABDOMINAL AORTIC ENDOVASCULAR STENT GRAFT N/A 01/02/2019   Procedure: ABDOMINAL AORTIC ENDOVASCULAR STENT GRAFT;  Surgeon: Nada Libman, MD;  Location: [facility] OR;  Service: Vascular;  Laterality: N/A;   AORTIC VALVE REPLACEMENT     Using a 25 mm homograft with reimplanation of his coronary arteries   BACK SURGERY     CORONARY ARTERY BYPASS GRAFT  2005   By Dr Laneta Simmers, He had a LIMA placed to the LAD, a vien to the diagonal, a  vein to the marginal, a vein to the 2nd diagonal, a vein  to the PDA, PLA vessel.   IR ANGIOGRAM PELVIS SELECTIVE OR SUPRASELECTIVE  06/12/2020   IR ANGIOGRAM SELECTIVE EACH ADDITIONAL VESSEL  06/12/2020   IR ANGIOGRAM SELECTIVE EACH ADDITIONAL VESSEL  06/12/2020   IR ANGIOGRAM SELECTIVE EACH ADDITIONAL VESSEL  06/12/2020   IR ANGIOGRAM VISCERAL SELECTIVE  06/12/2020   IR EMBO ARTERIAL NOT HEMORR HEMANG INC GUIDE ROADMAPPING  06/12/2020   IR RADIOLOGIST EVAL & MGMT  04/22/2020   IR RADIOLOGIST EVAL & MGMT  07/08/2020   IR RADIOLOGIST EVAL & MGMT  08/17/2021   IR RADIOLOGIST EVAL & MGMT  08/16/2022   IR US GUIDE VASC ACCESS LEFT  06/12/2020    Allergies: Patient has no known allergies.  Medications: Prior to  Admission medications   Medication Sig Start Date End Date Taking? Authorizing Provider  allopurinol (ZYLOPRIM) 300 MG tablet Take 300 mg by mouth daily.  07/25/16   [provider]  aspirin 81 MG tablet Take 81 mg by mouth daily.    [provider]  Coenzyme Q10 (CO Q-10) 200 MG CAPS Take 200 mg by mouth daily.    [provider]  colchicine 0.6 MG tablet Take 0.6 mg by mouth daily as needed (gout).     [provider]  escitalopram (LEXAPRO) 10 MG tablet Take 5 mg by mouth daily.     [provider]  ezetimibe (ZETIA) 10 MG tablet Take 1 tablet (10 mg total) by mouth daily. 06/16/20 09/14/20  Ronney Asters, NP  furosemide (LASIX) 40 MG tablet Take 20 mg by mouth daily as needed for fluid.     [provider]  irbesartan-hydrochlorothiazide (AVALIDE) 150-12.5 MG per tablet Take 0.5 tablets by mouth daily.     [provider]  metoprolol succinate (TOPROL-XL) 25 MG 24 hr tablet TAKE 1/2 TABLET BY MOUTH EVERY DAY 11/11/20   Lennette Bihari, MD  nitroGLYCERIN (NITROSTAT) 0.4 MG SL tablet PLACE 1 TABLET (0.4 MG TOTAL) UNDER THE TONGUE EVERY 5 (FIVE) MINUTES AS NEEDED FOR CHEST PAIN. Patient taking differently: Place 0.4 mg under the tongue every 5 (five) minutes as needed for chest pain. PLACE 1 TABLET (0.4 MG TOTAL) UNDER THE TONGUE EVERY 5 (FIVE) MINUTES AS NEEDED FOR CHEST PAIN. 10/17/17   Lennette Bihari, MD  pravastatin (PRAVACHOL) 40 MG tablet Take 1 tablet (40 mg total) by mouth every evening. 03/14/23 12/09/23  Ronney Asters, NP  predniSONE (DELTASONE) 10 MG tablet Take 10 mg by mouth as needed (gout flare up).  03/31/13   [provider]  warfarin (COUMADIN) 5 MG tablet Take 5 mg by mouth daily.     [provider]     Family History  Problem Relation Age of Onset   Heart disease Mother    Heart disease Father    Cancer Sister    Heart disease Sister    Heart disease Brother     Social History    Socioeconomic History   Marital status: Divorced    Spouse name: Not on file   Number of children: 2   Years of education: Not on file   Highest education level: Not on file  Occupational History   Occupation: self employed logger  Tobacco Use   Smoking status: Never   Smokeless tobacco: Never  Vaping Use   Vaping status: Never Used  Substance and Sexual Activity   Alcohol use: No   Drug use:  No   Sexual activity: Not on file  Other Topics Concern   Not on file  Social History Narrative   Not on file   Social Determinants of Health   Financial Resource Strain: Low Risk  (06/19/2023)   Received from [facility]   Overall Financial Resource Strain (CARDIA)    Difficulty of Paying Living Expenses: Not very hard  Food Insecurity: No Food Insecurity (06/19/2023)   Received from [facility]   Hunger Vital Sign    Worried About Running Out of Food in the Last Year: Never true    Ran Out of Food in the Last Year: Never true  Transportation Needs: No Transportation Needs (06/19/2023)   Received from [facility] - Transportation    Lack of Transportation (Medical): No    Lack of Transportation (Non-Medical): No  Physical Activity: Unknown (06/19/2023)   Received from [facility]   Exercise Vital Sign    Days of Exercise per Week: 0 days    Minutes of Exercise per Session: Not on file  Stress: No Stress Concern Present (06/19/2023)   Received from [facility] - Occupational Stress Questionnaire    Feeling of Stress : Not at all  Social Connections: Moderately Integrated (06/19/2023)   Received from [facility]   Social Network    How would you rate your social network (family, work, friends)?: Adequate participation with social networks       Review of Systems: A 12 point ROS discussed and pertinent positives are indicated in the HPI above.  All other systems are negative.  Review of Systems  Vital  Signs: There were no vitals taken for this visit.      Physical Exam  Mallampati Score:     Imaging: No results found.  Labs:  CBC: No results for input(s): "WBC", "HGB", "HCT", "PLT" in the last 8760 hours.  COAGS: No results for input(s): "INR", "APTT" in the last 8760 hours.  BMP: Recent Labs    03/09/23 1232  NA 144  K 4.3  CL 101  CO2 26  GLUCOSE 98  BUN 27  CALCIUM 9.7  CREATININE 1.35*    LIVER FUNCTION TESTS: Recent Labs    03/09/23 1232  BILITOT 0.4  AST 27  ALT 24  ALKPHOS 117  PROT 7.2  ALBUMIN 4.3    TUMOR MARKERS: No results for input(s): "AFPTM", "CEA", "CA199", "CHROMGRNA" in the last 8760 hours.  Assessment and Plan:   Mr Sobek is 77 yo male with previous trans-arterial embolization of type II endoleak (stable chronic illness).    His CT imaging shows decreased size of the aneurysm sac after embolization, and he has no symptoms.    We again discussed surveillance at this point, likely with non-contrast CT studies given his CKD.  As there has been sac contraction/remodeling, I think safe to bump out his next visit to 24 months with non-contrast CT.  He is good with this plan.  .     I asked him to follow up with Urology regarding his renal lesions.    Plan: - We will set up a follow up in 24 months/2 years with non-contrast abd/pelvis CT given his CKD.    - Continue medical therapy    Electronically Signed: Gilmer Mor 09/01/2023, 1:05 PM   I spent a total of    25 Minutes in asynchronous clinical consultation, reviewing of labs, prior test, including reviewing several CT scans, reviewing  prior operative notes, and labs, with greater than 50% of which was counseling/coordinating care for prior treatment of endoleak with embolization, surveillance,

## 2023-09-06 ENCOUNTER — Telehealth: Payer: Medicare Other

## 2023-12-14 ENCOUNTER — Other Ambulatory Visit: Payer: Self-pay | Admitting: General Practice

## 2024-09-13 ENCOUNTER — Other Ambulatory Visit: Payer: Self-pay | Admitting: General Practice

## 2024-10-15 ENCOUNTER — Other Ambulatory Visit: Payer: Self-pay | Admitting: General Practice

## 2024-11-16 ENCOUNTER — Other Ambulatory Visit: Payer: Self-pay | Admitting: General Practice

## 2025-01-02 NOTE — Progress Notes (Unsigned)
 "   Cardiology Clinic Note   Patient Name: Kenneth Scott Date of Encounter: 01/02/2025  Primary Care Provider:  Corrington, Kip A, MD Primary Cardiologist:  Debby Sor, MD (Inactive)  Patient Profile    Kenneth Scott 79 year old male presents to the clinic today for follow-up of his CAD, hypertension, and AAA.   Past Medical History    Past Medical History:  Diagnosis Date   AAA (abdominal aortic aneurysm) 03/04/13   aortic duplex dopler- when compared tp previous study ther is no change. aneurysmal dilation measuring 3.5 x 3.4 cemtimeters   Anxiety    Chronic anticoagulation    Coronary artery disease 04/23/13   NUC STRESS TEST-EF 52% High risk scan two separate perfusion defects and a suggestion of reveribility in the inferior-inferolateral wall.   Gout    History of DVT (deep vein thrombosis)    History of kidney stones    Hyperlipidemia    Hypertension 09/06/12   ECHO-EF 45%    Metatarsalgia of both feet    Pancreatic cyst    Pancreatic lesion    PE (pulmonary embolism)    PVD (peripheral vascular disease)    Sleep apnea    No cpap   TIA (transient ischemic attack)    Past Surgical History:  Procedure Laterality Date   ABDOMINAL AORTIC ENDOVASCULAR STENT GRAFT  01/02/2019   ABDOMINAL AORTIC ENDOVASCULAR STENT GRAFT N/A 01/02/2019   Procedure: ABDOMINAL AORTIC ENDOVASCULAR STENT GRAFT;  Surgeon: Serene Gaile ORN, MD;  Location: [facility] OR;  Service: Vascular;  Laterality: N/A;   AORTIC VALVE REPLACEMENT     Using a 25 mm homograft with reimplanation of his coronary arteries   BACK SURGERY     CORONARY ARTERY BYPASS GRAFT  2005   By Dr Lucas, He had a LIMA placed to the LAD, a vien to the diagonal, a vein to the marginal, a vein to the 2nd diagonal, a vein  to the PDA, PLA vessel.   IR ANGIOGRAM PELVIS SELECTIVE OR SUPRASELECTIVE  06/12/2020   IR ANGIOGRAM SELECTIVE EACH ADDITIONAL VESSEL  06/12/2020   IR ANGIOGRAM SELECTIVE EACH ADDITIONAL VESSEL  06/12/2020   IR  ANGIOGRAM SELECTIVE EACH ADDITIONAL VESSEL  06/12/2020   IR ANGIOGRAM VISCERAL SELECTIVE  06/12/2020   IR EMBO ARTERIAL NOT HEMORR HEMANG INC GUIDE ROADMAPPING  06/12/2020   IR RADIOLOGIST EVAL & MGMT  04/22/2020   IR RADIOLOGIST EVAL & MGMT  07/08/2020   IR RADIOLOGIST EVAL & MGMT  08/17/2021   IR RADIOLOGIST EVAL & MGMT  08/16/2022   IR RADIOLOGIST EVAL & MGMT  09/01/2023   IR US  GUIDE VASC ACCESS LEFT  06/12/2020    Allergies  No Known Allergies  History of Present Illness    Kenneth Scott has a PMH of coronary artery disease status post anterior MI in 2005.  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  His nuclear stress test 2014 showed a fixed defect at the apex consistent with previous MI.  Mild reversibility in the inferior, inferior lateral and lateral area were noted however, these were not significant changes from his prior study.  His echocardiogram 9/17 showed an EF of 50-55%, akinesis of the mid apical anteroseptal myocardium, G2 DD, mild LAE, mild AI.  He has been followed by vascular surgery.  Recent carotid Dopplers obtained on 1/20 showed mild carotid disease and bilateral ICA.  Bilateral  ABI were normal.  A CT angiogram of the abdomen showed previous abdominal aortic aneurysm had increased from 3.4 cm to 5.2 cm.  Due to the size of AAA he underwent endovascular repair.   He presented to the clinic on 12/28/2018 and was seen by Kenneth Ford, PA-C.  During that time he denied chest pain, increased dyspnea with exertion.  A nuclear stress test was  recommended prior to surgery.  It showed no ischemia, and old infarct pattern with an EF of 55%.   He presented the clinic 05/22/20 for follow-up evaluation and preoperative cardiac clearance.  He stated he felt well.  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  He  stated that his son's had asked him to cut back on his workload however, he enjoyed working.  He stated that his diet was good but he had some indiscretion with salt.  He denied activity intolerance, exertional chest discomfort, and  DOE.  I  ordered a lipid panel and BMP .  I will gave him salty 6 diet sheet, completed preoperative cardiac evaluation  for his upcoming surgery and planned follow-up with Dr. Burnard in 6 months.    He presented 03/09/23 for follow-up evaluation and stated he felt well.  He was lost to follow-up.  He reported that he did not receive a cardiology notification for follow-up appointment.  He continued to work with his heavy equipment 6 days/week.  He did report some dietary indiscretion.  We reviewed the importance of low-sodium diet.  He and his wife expressed understanding.  He reported that his youngest son passed away.  His blood pressure was well-controlled.  His EKG showed sinus bradycardia 56 bpm.  I ordered a BMP, fasting lipids, and LFTs.   Follow-up in 12 months was planned.    He presents to the clinic today for follow-up evaluation.  He states***.   Today he denies chest pain, shortness of breath, lower extremity edema, fatigue, palpitations, melena, hematuria, hemoptysis, diaphoresis, weakness, presyncope, syncope, orthopnea, and PND.    Home Medications    Prior to Admission medications   Medication Sig Start Date End Date Taking? Authorizing Provider  allopurinol  (ZYLOPRIM ) 300 MG tablet Take 300 mg by mouth daily.  07/25/16   [provider]  aspirin  81 MG tablet Take 81 mg by mouth daily.    [provider]  Coenzyme Q10 (CO Q-10) 200 MG CAPS Take 200 mg by mouth daily.    [provider]  colchicine  0.6 MG tablet Take 0.6 mg by mouth daily as needed (gout).     [provider]  escitalopram  (LEXAPRO ) 10 MG tablet Take 5 mg by mouth daily.     [provider]  ezetimibe  (ZETIA ) 10 MG tablet Take 1 tablet (10 mg  total) by mouth daily. 06/16/20 09/14/20  Emelia Josefa HERO, NP  furosemide  (LASIX ) 40 MG tablet Take 20 mg by mouth daily as needed for fluid.     [provider]  irbesartan -hydrochlorothiazide  (AVALIDE) 150-12.5 MG per tablet Take 0.5 tablets by mouth daily.     [provider]  metoprolol  succinate (TOPROL -XL) 25 MG 24 hr tablet TAKE 1/2 TABLET BY MOUTH EVERY DAY 11/11/20   Burnard Debby LABOR, MD  nitroGLYCERIN  (NITROSTAT ) 0.4 MG SL tablet PLACE 1 TABLET (0.4 MG TOTAL) UNDER THE TONGUE EVERY 5 (FIVE) MINUTES AS NEEDED FOR CHEST PAIN. Patient taking differently: Place 0.4 mg under the tongue every 5 (five) minutes as needed for chest pain.  PLACE 1 TABLET (0.4 MG TOTAL) UNDER THE TONGUE EVERY 5 (FIVE) MINUTES AS NEEDED FOR CHEST PAIN. 10/17/17   Burnard Debby LABOR, MD  pravastatin  (PRAVACHOL ) 20 MG tablet Take 20 mg by mouth daily.  07/25/16   [provider]  predniSONE  (DELTASONE ) 10 MG tablet Take 10 mg by mouth as needed (gout flare up).  03/31/13   [provider]  warfarin (COUMADIN ) 5 MG tablet Take 5 mg by mouth daily.     [provider]    Family History    Family History  Problem Relation Age of Onset   Heart disease Mother    Heart disease Father    Cancer Sister    Heart disease Sister    Heart disease Brother    He indicated that his mother is deceased. He indicated that his father is deceased. He indicated that his sister is deceased. He indicated that the status of his brother is unknown.  Social History    Social History   Socioeconomic History   Marital status: Divorced    Spouse name: Not on file   Number of children: 2   Years of education: Not on file   Highest education level: Not on file  Occupational History   Occupation: self employed logger  Tobacco Use   Smoking status: Never   Smokeless tobacco: Never  Vaping Use   Vaping status: Never Used  Substance and Sexual Activity   Alcohol use: No   Drug use: No    Sexual activity: Not on file  Other Topics Concern   Not on file  Social History Narrative   Not on file   Social Drivers of Health   Tobacco Use: Low Risk (10/22/2024)   Received from [facility]   Patient History    Smoking Tobacco Use: Never    Smokeless Tobacco Use: Never    Passive Exposure: Never  Financial Resource Strain: Low Risk (07/02/2024)   Received from [facility]   Overall Financial Resource Strain (CARDIA)    How hard is it for you to pay for the very basics like food, housing, medical care, and heating?: Not hard at all  Food Insecurity: No Food Insecurity (07/02/2024)   Received from [facility]   Epic    Within the past 12 months, you worried that your food would run out before you got the money to buy more.: Never true    Within the past 12 months, the food you bought just didn't last and you didn't have money to get more.: Never true  Transportation Needs: No Transportation Needs (07/02/2024)   Received from [facility]    In the past 12 months, has lack of transportation kept you from medical appointments or from getting medications?: No    In the past 12 months, has lack of transportation kept you from meetings, work, or from getting things needed for daily living?: No  Physical Activity: Sufficiently Active (07/02/2024)   Received from [facility]   Exercise Vital Sign    On average, how many days per week do you engage in moderate to strenuous exercise (like a brisk walk)?: 5 days    On average, how many minutes do you engage in exercise at this level?: 60 min  Stress: No Stress Concern Present (07/02/2024)   Received from [facility] - Occupational Stress Questionnaire    Do you feel stress - tense, restless, nervous, or anxious, or  unable to sleep at night because your mind is troubled all the time - these days?: Not at all  Social Connections: Socially Integrated (07/02/2024)   Received from [facility]   Social Network    How would you rate your social network (family, work, friends)?: Good participation with social networks  Intimate Partner Violence: Not At Risk (07/02/2024)   Received from [facility]   HITS    Over the last 12 months how often did your partner physically hurt you?: Never    Over the last 12 months how often did your partner insult you or talk down to you?: Never    Over the last 12 months how often did your partner threaten you with physical harm?: Never    Over the last 12 months how often did your partner scream or curse at you?: Never  Depression (PHQ2-9): Not on file  Alcohol Screen: Not on file  Housing: Low Risk (07/02/2024)   Received from [facility]    In the last 12 months, was there a time when you were not able to pay the mortgage or rent on time?: No    In the past 12 months, how many times have you moved where you were living?: 0    At any time in the past 12 months, were you homeless or living in a shelter (including now)?: No  Utilities: Not At Risk (07/02/2024)   Received [facility]    In the past 12 months has the electric, gas, oil, or water company threatened to shut off services in your home?: No  Health Literacy: Not on file     Review of Systems    General:  No chills, fever, night sweats or weight changes.  Cardiovascular:  No chest pain, dyspnea on exertion, edema, orthopnea, palpitations, paroxysmal nocturnal dyspnea. Dermatological: No rash, lesions/masses Respiratory: No cough, dyspnea Urologic: No hematuria, dysuria Abdominal:   No nausea, vomiting, diarrhea, bright red blood per rectum, melena, or hematemesis Neurologic:  No visual changes, wkns, changes in mental status. All other systems reviewed and are otherwise negative except as noted above.  Physical Exam    VS:  There were no vitals taken for this visit. , BMI There is no height or weight on file to calculate BMI. GEN: Well nourished, well  developed, in no acute distress. HEENT: normal. Neck: Supple, no JVD, carotid bruits, or masses. Cardiac: RRR, no murmurs, rubs, or gallops. No clubbing, cyanosis, edema.  Radials/DP/PT 2+ and equal bilaterally.  Respiratory:  Respirations regular and unlabored, clear to auscultation bilaterally. GI: Soft, nontender, nondistended, BS + x 4. MS: no deformity or atrophy. Skin: warm and dry, no rash. Neuro:  Strength and sensation are intact. Psych: Normal affect.  Accessory Clinical Findings    Recent Labs: No results found for requested labs within last 365 days.   Recent Lipid Panel    Component Value Date/Time   CHOL 176 03/09/2023 1232   TRIG 125 03/09/2023 1232   HDL 34 (L) 03/09/2023 1232   CHOLHDL 5.2 (H) 03/09/2023 1232   CHOLHDL 4.6 07/29/2016 0845   VLDL 43 (H) 07/29/2016 0845   LDLCALC 119 (H) 03/09/2023 1232    No BP recorded.  {Refresh Note OR Click here to enter BP  :1}***    ECG personally reviewed by me today-sinus bradycardia 56 bpm- No acute changes  EKG 05/22/2020 sinus bradycardia with sinus arrhythmia 57 bpm- No acute changes   EKG  12/28/2018 Sinus bradycardia 47 bpm   Echocardiogram 08/12/2016 Study Conclusions   - Left ventricle: The cavity size was normal. Wall thickness was    increased in a pattern of mild LVH. Systolic function was normal.    The estimated ejection fraction was in the range of 50% to 55%.    There is akinesis of the mid-apicalanteroseptal myocardium.    Features are consistent with a pseudonormal left ventricular    filling pattern, with concomitant abnormal relaxation and    increased filling pressure (grade 2 diastolic dysfunction).  - Aortic valve: An aortic root homograft was present. There was    mild regurgitation.  - Left atrium: The atrium was mildly dilated.  Assessment & Plan   1.  Coronary artery disease-denies chest pain and recent episodes of exertional chest discomfort.  He is status post CABG x6 Continue  continue aspirin , Avalide, metoprolol , nitroglycerin  Heart healthy low-sodium diet-salty 6 reviewed Maintain physical activity Order BMP, CBC  Hyperlipidemia-LDL 83 on 05/22/20. Continue pravastatin , ezetimibe  and, aspirin  Heart healthy low-sodium high-fiber diet Order fasting lipids and LFTs  Essential hypertension-BP today 134***/84.   Continue metoprolol , Avalide Heart healthy low-sodium diet-salty 6 given Increase physical activity as tolerated Order BMP   History of DVT/PE-Coumadin  compliant.  Denies bleeding issues. Follows with PCP Continue Coumadin    History of thoracic aortic aneurysm-today he denies episodes of back and chest discomfort.  No increased DOE or activity intolerance status post aortic resection and AVR. Order echocardiogram***    Disposition: Follow-up with Dr.Segal or me in 12 months.   Josefa HERO. Kazia Grisanti NP-C     01/02/2025, 7:30 AM [facility] [address] Office [phone] Fax [phone]    I spent 14***minutes examining this patient, reviewing medications, and using patient centered shared decision making involving her cardiac care.  Prior to her visit I spent greater than 20 minutes reviewing her past medical history,  medications, and prior cardiac tests.  "

## 2025-01-06 ENCOUNTER — Ambulatory Visit: Admitting: General Practice
